# Patient Record
Sex: Male | Born: 1950
Health system: Southern US, Community
[De-identification: ages and names within clinical notes are randomized; demographics above are authoritative.]

## PROBLEM LIST (undated history)

## (undated) DIAGNOSIS — M660 Rupture of popliteal cyst: Secondary | ICD-10-CM

## (undated) DIAGNOSIS — E119 Type 2 diabetes mellitus without complications: Secondary | ICD-10-CM

## (undated) DIAGNOSIS — D1730 Benign lipomatous neoplasm of skin and subcutaneous tissue of unspecified sites: Secondary | ICD-10-CM

## (undated) DIAGNOSIS — J449 Chronic obstructive pulmonary disease, unspecified: Secondary | ICD-10-CM

## (undated) DIAGNOSIS — M1710 Unilateral primary osteoarthritis, unspecified knee: Secondary | ICD-10-CM

## (undated) DIAGNOSIS — M179 Osteoarthritis of knee, unspecified: Secondary | ICD-10-CM

## (undated) DIAGNOSIS — M1990 Unspecified osteoarthritis, unspecified site: Secondary | ICD-10-CM

## (undated) DIAGNOSIS — N529 Male erectile dysfunction, unspecified: Secondary | ICD-10-CM

## (undated) DIAGNOSIS — Z87891 Personal history of nicotine dependence: Secondary | ICD-10-CM

## (undated) DIAGNOSIS — I471 Supraventricular tachycardia, unspecified: Secondary | ICD-10-CM

## (undated) DIAGNOSIS — E785 Hyperlipidemia, unspecified: Secondary | ICD-10-CM

## (undated) DIAGNOSIS — I10 Essential (primary) hypertension: Secondary | ICD-10-CM

## (undated) DIAGNOSIS — M7062 Trochanteric bursitis, left hip: Secondary | ICD-10-CM

## (undated) HISTORY — DX: Chronic obstructive pulmonary disease, unspecified: J44.9

## (undated) HISTORY — DX: Supraventricular tachycardia, unspecified: I47.10

## (undated) HISTORY — DX: Male erectile dysfunction, unspecified: N52.9

## (undated) HISTORY — DX: Unspecified osteoarthritis, unspecified site: M19.90

## (undated) HISTORY — DX: Benign lipomatous neoplasm of skin and subcutaneous tissue of unspecified sites: D17.30

## (undated) HISTORY — DX: Unilateral primary osteoarthritis, unspecified knee: M17.10

## (undated) HISTORY — DX: Hyperlipidemia, unspecified: E78.5

## (undated) HISTORY — DX: Type 2 diabetes mellitus without complications: E11.9

## (undated) HISTORY — DX: Osteoarthritis of knee, unspecified: M17.9

## (undated) HISTORY — DX: Trochanteric bursitis, left hip: M70.62

## (undated) HISTORY — DX: Supraventricular tachycardia: I47.1

## (undated) HISTORY — DX: Rupture of popliteal cyst: M66.0

## (undated) HISTORY — DX: Essential (primary) hypertension: I10

## (undated) HISTORY — DX: Personal history of nicotine dependence: Z87.891

---

## 1999-03-10 ENCOUNTER — Emergency Department (HOSPITAL_COMMUNITY): Admission: EM | Admit: 1999-03-10 | Discharge: 1999-03-10 | Payer: Self-pay | Admitting: Emergency Medicine

## 1999-08-17 ENCOUNTER — Encounter: Payer: Self-pay | Admitting: Emergency Medicine

## 1999-08-17 ENCOUNTER — Inpatient Hospital Stay (HOSPITAL_COMMUNITY): Admission: EM | Admit: 1999-08-17 | Discharge: 1999-08-21 | Payer: Self-pay

## 1999-08-17 ENCOUNTER — Encounter: Payer: Self-pay | Admitting: Internal Medicine

## 1999-08-18 ENCOUNTER — Encounter: Payer: Self-pay | Admitting: Internal Medicine

## 1999-08-19 ENCOUNTER — Encounter: Payer: Self-pay | Admitting: Internal Medicine

## 1999-08-21 ENCOUNTER — Encounter: Payer: Self-pay | Admitting: Internal Medicine

## 1999-08-30 ENCOUNTER — Encounter: Admission: RE | Admit: 1999-08-30 | Discharge: 1999-08-30 | Payer: Self-pay | Admitting: Internal Medicine

## 1999-08-30 ENCOUNTER — Encounter: Payer: Self-pay | Admitting: Internal Medicine

## 2000-07-22 ENCOUNTER — Emergency Department (HOSPITAL_COMMUNITY): Admission: EM | Admit: 2000-07-22 | Discharge: 2000-07-22 | Payer: Self-pay | Admitting: *Deleted

## 2001-04-14 ENCOUNTER — Emergency Department (HOSPITAL_COMMUNITY): Admission: EM | Admit: 2001-04-14 | Discharge: 2001-04-15 | Payer: Self-pay | Admitting: Emergency Medicine

## 2002-05-02 ENCOUNTER — Emergency Department (HOSPITAL_COMMUNITY): Admission: EM | Admit: 2002-05-02 | Discharge: 2002-05-02 | Payer: Self-pay | Admitting: Emergency Medicine

## 2002-05-02 ENCOUNTER — Encounter: Payer: Self-pay | Admitting: Emergency Medicine

## 2002-10-13 ENCOUNTER — Emergency Department (HOSPITAL_COMMUNITY): Admission: EM | Admit: 2002-10-13 | Discharge: 2002-10-13 | Payer: Self-pay | Admitting: *Deleted

## 2002-12-21 ENCOUNTER — Emergency Department (HOSPITAL_COMMUNITY): Admission: EM | Admit: 2002-12-21 | Discharge: 2002-12-21 | Payer: Self-pay | Admitting: Emergency Medicine

## 2003-03-09 ENCOUNTER — Emergency Department (HOSPITAL_COMMUNITY): Admission: EM | Admit: 2003-03-09 | Discharge: 2003-03-09 | Payer: Self-pay | Admitting: Emergency Medicine

## 2003-09-12 ENCOUNTER — Encounter: Admission: RE | Admit: 2003-09-12 | Discharge: 2003-09-12 | Payer: Self-pay | Admitting: Internal Medicine

## 2003-10-31 ENCOUNTER — Encounter: Admission: RE | Admit: 2003-10-31 | Discharge: 2003-10-31 | Payer: Self-pay | Admitting: Internal Medicine

## 2003-11-19 ENCOUNTER — Ambulatory Visit (HOSPITAL_COMMUNITY): Admission: RE | Admit: 2003-11-19 | Discharge: 2003-11-19 | Payer: Self-pay | Admitting: Internal Medicine

## 2004-05-15 ENCOUNTER — Ambulatory Visit (HOSPITAL_COMMUNITY): Admission: RE | Admit: 2004-05-15 | Discharge: 2004-05-15 | Payer: Self-pay | Admitting: Internal Medicine

## 2004-05-15 ENCOUNTER — Ambulatory Visit: Payer: Self-pay | Admitting: Internal Medicine

## 2004-05-24 ENCOUNTER — Ambulatory Visit: Payer: Self-pay | Admitting: Internal Medicine

## 2005-03-03 ENCOUNTER — Emergency Department (HOSPITAL_COMMUNITY): Admission: EM | Admit: 2005-03-03 | Discharge: 2005-03-03 | Payer: Self-pay | Admitting: Emergency Medicine

## 2005-03-13 ENCOUNTER — Ambulatory Visit: Payer: Self-pay | Admitting: Internal Medicine

## 2005-03-17 ENCOUNTER — Ambulatory Visit (HOSPITAL_COMMUNITY): Admission: RE | Admit: 2005-03-17 | Discharge: 2005-03-17 | Payer: Self-pay | Admitting: Internal Medicine

## 2005-12-16 ENCOUNTER — Emergency Department (HOSPITAL_COMMUNITY): Admission: EM | Admit: 2005-12-16 | Discharge: 2005-12-16 | Payer: Self-pay | Admitting: Emergency Medicine

## 2006-01-08 ENCOUNTER — Ambulatory Visit: Payer: Self-pay | Admitting: Internal Medicine

## 2006-04-02 ENCOUNTER — Emergency Department (HOSPITAL_COMMUNITY): Admission: EM | Admit: 2006-04-02 | Discharge: 2006-04-02 | Payer: Self-pay | Admitting: Emergency Medicine

## 2006-06-18 DIAGNOSIS — J438 Other emphysema: Secondary | ICD-10-CM | POA: Insufficient documentation

## 2006-06-18 DIAGNOSIS — J449 Chronic obstructive pulmonary disease, unspecified: Secondary | ICD-10-CM

## 2006-06-18 DIAGNOSIS — F528 Other sexual dysfunction not due to a substance or known physiological condition: Secondary | ICD-10-CM | POA: Insufficient documentation

## 2006-06-18 DIAGNOSIS — M1990 Unspecified osteoarthritis, unspecified site: Secondary | ICD-10-CM | POA: Insufficient documentation

## 2006-06-18 DIAGNOSIS — M7120 Synovial cyst of popliteal space [Baker], unspecified knee: Secondary | ICD-10-CM | POA: Insufficient documentation

## 2006-07-01 ENCOUNTER — Ambulatory Visit: Payer: Self-pay | Admitting: Internal Medicine

## 2006-07-23 ENCOUNTER — Emergency Department (HOSPITAL_COMMUNITY): Admission: EM | Admit: 2006-07-23 | Discharge: 2006-07-23 | Payer: Self-pay | Admitting: Emergency Medicine

## 2006-07-28 ENCOUNTER — Encounter: Payer: Self-pay | Admitting: Licensed Clinical Social Worker

## 2006-07-28 ENCOUNTER — Encounter (INDEPENDENT_AMBULATORY_CARE_PROVIDER_SITE_OTHER): Payer: Self-pay | Admitting: Internal Medicine

## 2006-07-28 ENCOUNTER — Ambulatory Visit: Payer: Self-pay | Admitting: Internal Medicine

## 2006-07-29 ENCOUNTER — Telehealth (INDEPENDENT_AMBULATORY_CARE_PROVIDER_SITE_OTHER): Payer: Self-pay | Admitting: *Deleted

## 2006-07-29 LAB — CONVERTED CEMR LAB
Amphetamine Screen, Ur: NEGATIVE
BUN: 10 mg/dL (ref 6–23)
Barbiturate Quant, Ur: NEGATIVE
Benzodiazepines.: NEGATIVE
CO2: 27 meq/L (ref 19–32)
Calcium: 9.2 mg/dL (ref 8.4–10.5)
Chloride: 107 meq/L (ref 96–112)
Cocaine Metabolites: NEGATIVE
Creatinine, Ser: 0.92 mg/dL (ref 0.40–1.50)
Creatinine,U: 409.8 mg/dL
Glucose, Bld: 97 mg/dL (ref 70–99)
HCT: 44 % (ref 39.0–52.0)
Hemoglobin: 14 g/dL (ref 13.0–17.0)
MCHC: 31.8 g/dL (ref 30.0–36.0)
MCV: 100 fL (ref 78.0–100.0)
Marijuana Metabolite: NEGATIVE
Methadone: NEGATIVE
Opiates: NEGATIVE
PSA: 0.89 ng/mL (ref 0.10–4.00)
Phencyclidine (PCP): NEGATIVE
Platelets: 266 10*3/uL (ref 150–400)
Potassium: 4.1 meq/L (ref 3.5–5.3)
Propoxyphene: NEGATIVE
RBC: 4.4 M/uL (ref 4.22–5.81)
RDW: 14.1 % — ABNORMAL HIGH (ref 11.5–14.0)
Sodium: 147 meq/L — ABNORMAL HIGH (ref 135–145)
WBC: 6.7 10*3/uL (ref 4.0–10.5)

## 2006-07-31 ENCOUNTER — Telehealth (INDEPENDENT_AMBULATORY_CARE_PROVIDER_SITE_OTHER): Payer: Self-pay | Admitting: *Deleted

## 2006-07-31 ENCOUNTER — Encounter: Payer: Self-pay | Admitting: Licensed Clinical Social Worker

## 2006-08-07 ENCOUNTER — Encounter (INDEPENDENT_AMBULATORY_CARE_PROVIDER_SITE_OTHER): Payer: Self-pay | Admitting: Internal Medicine

## 2006-08-07 ENCOUNTER — Encounter: Admission: RE | Admit: 2006-08-07 | Discharge: 2006-09-10 | Payer: Self-pay | Admitting: Internal Medicine

## 2006-08-07 ENCOUNTER — Ambulatory Visit: Payer: Self-pay | Admitting: Internal Medicine

## 2006-08-07 LAB — CONVERTED CEMR LAB
BUN: 7 mg/dL (ref 6–23)
CO2: 30 meq/L (ref 19–32)
Calcium: 9.4 mg/dL (ref 8.4–10.5)
Chloride: 105 meq/L (ref 96–112)
Creatinine, Ser: 0.89 mg/dL (ref 0.40–1.50)
Glucose, Bld: 101 mg/dL — ABNORMAL HIGH (ref 70–99)
Potassium: 4.9 meq/L (ref 3.5–5.3)
Sodium: 141 meq/L (ref 135–145)

## 2006-09-09 ENCOUNTER — Telehealth: Payer: Self-pay | Admitting: *Deleted

## 2006-09-10 ENCOUNTER — Encounter (INDEPENDENT_AMBULATORY_CARE_PROVIDER_SITE_OTHER): Payer: Self-pay | Admitting: Internal Medicine

## 2006-09-22 ENCOUNTER — Telehealth (INDEPENDENT_AMBULATORY_CARE_PROVIDER_SITE_OTHER): Payer: Self-pay | Admitting: *Deleted

## 2006-09-24 ENCOUNTER — Ambulatory Visit: Payer: Self-pay | Admitting: *Deleted

## 2006-09-24 DIAGNOSIS — Z87891 Personal history of nicotine dependence: Secondary | ICD-10-CM

## 2006-10-15 ENCOUNTER — Emergency Department (HOSPITAL_COMMUNITY): Admission: EM | Admit: 2006-10-15 | Discharge: 2006-10-15 | Payer: Self-pay | Admitting: Emergency Medicine

## 2006-11-11 ENCOUNTER — Ambulatory Visit: Payer: Self-pay | Admitting: Hospitalist

## 2006-12-16 ENCOUNTER — Encounter (INDEPENDENT_AMBULATORY_CARE_PROVIDER_SITE_OTHER): Payer: Self-pay | Admitting: Internal Medicine

## 2006-12-28 LAB — HM COLONOSCOPY

## 2007-01-08 ENCOUNTER — Ambulatory Visit: Payer: Self-pay | Admitting: Hospitalist

## 2007-01-20 ENCOUNTER — Encounter (INDEPENDENT_AMBULATORY_CARE_PROVIDER_SITE_OTHER): Payer: Self-pay | Admitting: Internal Medicine

## 2007-01-28 ENCOUNTER — Encounter (INDEPENDENT_AMBULATORY_CARE_PROVIDER_SITE_OTHER): Payer: Self-pay | Admitting: Internal Medicine

## 2007-03-01 ENCOUNTER — Telehealth: Payer: Self-pay | Admitting: *Deleted

## 2007-03-04 ENCOUNTER — Ambulatory Visit: Payer: Self-pay | Admitting: Internal Medicine

## 2007-03-04 ENCOUNTER — Encounter (INDEPENDENT_AMBULATORY_CARE_PROVIDER_SITE_OTHER): Payer: Self-pay | Admitting: Internal Medicine

## 2007-03-04 LAB — CONVERTED CEMR LAB
BUN: 17 mg/dL (ref 6–23)
CO2: 26 meq/L (ref 19–32)
Calcium: 8.9 mg/dL (ref 8.4–10.5)
Chloride: 106 meq/L (ref 96–112)
Creatinine, Ser: 0.92 mg/dL (ref 0.40–1.50)
Glucose, Bld: 124 mg/dL — ABNORMAL HIGH (ref 70–99)
HCT: 42.2 % (ref 39.0–52.0)
Hemoglobin: 14 g/dL (ref 13.0–17.0)
MCHC: 33.2 g/dL (ref 30.0–36.0)
MCV: 97.2 fL (ref 78.0–100.0)
Platelets: 213 10*3/uL (ref 150–400)
Potassium: 4.2 meq/L (ref 3.5–5.3)
RBC: 4.34 M/uL (ref 4.22–5.81)
RDW: 14.5 % — ABNORMAL HIGH (ref 11.5–14.0)
Sodium: 144 meq/L (ref 135–145)
WBC: 8.8 10*3/uL (ref 4.0–10.5)

## 2007-03-11 ENCOUNTER — Telehealth (INDEPENDENT_AMBULATORY_CARE_PROVIDER_SITE_OTHER): Payer: Self-pay | Admitting: Internal Medicine

## 2007-03-12 ENCOUNTER — Ambulatory Visit: Payer: Self-pay | Admitting: Hospitalist

## 2007-03-12 ENCOUNTER — Encounter (INDEPENDENT_AMBULATORY_CARE_PROVIDER_SITE_OTHER): Payer: Self-pay | Admitting: Internal Medicine

## 2007-03-15 LAB — CONVERTED CEMR LAB
Cholesterol: 215 mg/dL — ABNORMAL HIGH (ref 0–200)
HDL: 49 mg/dL (ref >39)
LDL Cholesterol: 143 mg/dL — ABNORMAL HIGH (ref 0–99)
Total CHOL/HDL Ratio: 4.4
Triglycerides: 115 mg/dL (ref <150)
VLDL: 23 mg/dL (ref 0–40)

## 2007-03-19 ENCOUNTER — Telehealth: Payer: Self-pay | Admitting: *Deleted

## 2007-03-25 ENCOUNTER — Telehealth (INDEPENDENT_AMBULATORY_CARE_PROVIDER_SITE_OTHER): Payer: Self-pay | Admitting: *Deleted

## 2007-04-07 ENCOUNTER — Ambulatory Visit: Payer: Self-pay | Admitting: Internal Medicine

## 2007-04-08 ENCOUNTER — Emergency Department (HOSPITAL_COMMUNITY): Admission: EM | Admit: 2007-04-08 | Discharge: 2007-04-08 | Payer: Self-pay | Admitting: Emergency Medicine

## 2007-05-19 ENCOUNTER — Telehealth (INDEPENDENT_AMBULATORY_CARE_PROVIDER_SITE_OTHER): Payer: Self-pay | Admitting: Internal Medicine

## 2007-06-08 ENCOUNTER — Telehealth: Payer: Self-pay | Admitting: *Deleted

## 2007-07-08 ENCOUNTER — Ambulatory Visit: Payer: Self-pay | Admitting: Infectious Disease

## 2007-07-08 DIAGNOSIS — I10 Essential (primary) hypertension: Secondary | ICD-10-CM | POA: Insufficient documentation

## 2007-07-22 ENCOUNTER — Encounter: Admission: RE | Admit: 2007-07-22 | Discharge: 2007-10-19 | Payer: Self-pay | Admitting: Internal Medicine

## 2007-07-28 ENCOUNTER — Encounter (INDEPENDENT_AMBULATORY_CARE_PROVIDER_SITE_OTHER): Payer: Self-pay | Admitting: Internal Medicine

## 2007-09-22 ENCOUNTER — Ambulatory Visit: Payer: Self-pay | Admitting: *Deleted

## 2007-10-14 ENCOUNTER — Ambulatory Visit: Payer: Self-pay | Admitting: Internal Medicine

## 2007-10-14 ENCOUNTER — Encounter (INDEPENDENT_AMBULATORY_CARE_PROVIDER_SITE_OTHER): Payer: Self-pay | Admitting: Internal Medicine

## 2007-10-14 LAB — CONVERTED CEMR LAB
ALT: 12 units/L (ref 0–53)
AST: 17 units/L (ref 0–37)
Albumin: 4.3 g/dL (ref 3.5–5.2)
Alkaline Phosphatase: 81 units/L (ref 39–117)
BUN: 22 mg/dL (ref 6–23)
CO2: 26 meq/L (ref 19–32)
Calcium: 9.2 mg/dL (ref 8.4–10.5)
Chloride: 107 meq/L (ref 96–112)
Cholesterol: 165 mg/dL (ref 0–200)
Creatinine, Ser: 1.07 mg/dL (ref 0.40–1.50)
Glucose, Bld: 112 mg/dL — ABNORMAL HIGH (ref 70–99)
HDL: 50 mg/dL (ref >39)
Hgb A1c MFr Bld: 6.3 %
LDL Cholesterol: 103 mg/dL — ABNORMAL HIGH (ref 0–99)
Potassium: 4.7 meq/L (ref 3.5–5.3)
Sodium: 143 meq/L (ref 135–145)
Total Bilirubin: 0.4 mg/dL (ref 0.3–1.2)
Total CHOL/HDL Ratio: 3.3
Total Protein: 7.9 g/dL (ref 6.0–8.3)
Triglycerides: 58 mg/dL (ref <150)
VLDL: 12 mg/dL (ref 0–40)

## 2007-10-28 ENCOUNTER — Ambulatory Visit: Payer: Self-pay | Admitting: Infectious Disease

## 2007-11-12 ENCOUNTER — Ambulatory Visit: Payer: Self-pay | Admitting: Internal Medicine

## 2007-12-08 ENCOUNTER — Telehealth: Payer: Self-pay | Admitting: *Deleted

## 2008-04-12 ENCOUNTER — Ambulatory Visit: Payer: Self-pay | Admitting: Internal Medicine

## 2008-04-13 ENCOUNTER — Ambulatory Visit (HOSPITAL_COMMUNITY): Admission: RE | Admit: 2008-04-13 | Discharge: 2008-04-13 | Payer: Self-pay | Admitting: Internal Medicine

## 2008-04-13 ENCOUNTER — Ambulatory Visit: Payer: Self-pay | Admitting: Internal Medicine

## 2008-04-13 ENCOUNTER — Encounter: Payer: Self-pay | Admitting: Internal Medicine

## 2008-04-18 ENCOUNTER — Telehealth (INDEPENDENT_AMBULATORY_CARE_PROVIDER_SITE_OTHER): Payer: Self-pay | Admitting: Internal Medicine

## 2008-04-18 ENCOUNTER — Ambulatory Visit: Payer: Self-pay | Admitting: Infectious Disease

## 2008-04-18 ENCOUNTER — Inpatient Hospital Stay (HOSPITAL_COMMUNITY): Admission: EM | Admit: 2008-04-18 | Discharge: 2008-04-19 | Payer: Self-pay | Admitting: Emergency Medicine

## 2008-04-18 ENCOUNTER — Encounter: Payer: Self-pay | Admitting: Infectious Disease

## 2008-04-19 ENCOUNTER — Encounter (INDEPENDENT_AMBULATORY_CARE_PROVIDER_SITE_OTHER): Payer: Self-pay | Admitting: Internal Medicine

## 2008-04-19 DIAGNOSIS — I471 Supraventricular tachycardia: Secondary | ICD-10-CM

## 2008-04-20 ENCOUNTER — Telehealth (INDEPENDENT_AMBULATORY_CARE_PROVIDER_SITE_OTHER): Payer: Self-pay | Admitting: Internal Medicine

## 2008-04-24 ENCOUNTER — Ambulatory Visit: Payer: Self-pay | Admitting: Infectious Diseases

## 2008-05-17 ENCOUNTER — Ambulatory Visit: Payer: Self-pay | Admitting: *Deleted

## 2008-05-23 ENCOUNTER — Telehealth (INDEPENDENT_AMBULATORY_CARE_PROVIDER_SITE_OTHER): Payer: Self-pay | Admitting: Internal Medicine

## 2008-05-29 ENCOUNTER — Ambulatory Visit: Payer: Self-pay | Admitting: Internal Medicine

## 2008-06-05 ENCOUNTER — Emergency Department (HOSPITAL_COMMUNITY): Admission: EM | Admit: 2008-06-05 | Discharge: 2008-06-05 | Payer: Self-pay | Admitting: Emergency Medicine

## 2008-06-20 ENCOUNTER — Ambulatory Visit: Payer: Self-pay | Admitting: Infectious Diseases

## 2008-06-20 ENCOUNTER — Encounter (INDEPENDENT_AMBULATORY_CARE_PROVIDER_SITE_OTHER): Payer: Self-pay | Admitting: Internal Medicine

## 2008-06-21 ENCOUNTER — Telehealth: Payer: Self-pay | Admitting: *Deleted

## 2008-06-21 ENCOUNTER — Encounter (INDEPENDENT_AMBULATORY_CARE_PROVIDER_SITE_OTHER): Payer: Self-pay | Admitting: Internal Medicine

## 2008-06-21 DIAGNOSIS — E1165 Type 2 diabetes mellitus with hyperglycemia: Secondary | ICD-10-CM

## 2008-06-21 LAB — CONVERTED CEMR LAB
BUN: 26 mg/dL — ABNORMAL HIGH (ref 6–23)
CO2: 21 meq/L (ref 19–32)
Calcium: 8.9 mg/dL (ref 8.4–10.5)
Chloride: 90 meq/L — ABNORMAL LOW (ref 96–112)
Creatinine, Ser: 1.75 mg/dL — ABNORMAL HIGH (ref 0.40–1.50)
Glucose, Bld: 798 mg/dL (ref 70–99)
Potassium: 4.8 meq/L (ref 3.5–5.3)
Sodium: 124 meq/L — ABNORMAL LOW (ref 135–145)

## 2008-06-28 ENCOUNTER — Ambulatory Visit: Payer: Self-pay | Admitting: Internal Medicine

## 2008-06-28 ENCOUNTER — Inpatient Hospital Stay (HOSPITAL_COMMUNITY): Admission: AD | Admit: 2008-06-28 | Discharge: 2008-07-01 | Payer: Self-pay | Admitting: Infectious Diseases

## 2008-06-28 ENCOUNTER — Encounter (INDEPENDENT_AMBULATORY_CARE_PROVIDER_SITE_OTHER): Payer: Self-pay | Admitting: Internal Medicine

## 2008-06-28 LAB — CONVERTED CEMR LAB
ALT: 23 units/L (ref 0–53)
AST: 25 units/L (ref 0–37)
Albumin: 4.1 g/dL (ref 3.5–5.2)
Alkaline Phosphatase: 94 units/L (ref 39–117)
BUN: 39 mg/dL — ABNORMAL HIGH (ref 6–23)
Blood Glucose, Home Monitor: 2 mg/dL
CO2: 24 meq/L (ref 19–32)
Calcium: 9.1 mg/dL (ref 8.4–10.5)
Chloride: 85 meq/L — ABNORMAL LOW (ref 96–112)
Creatinine, Ser: 2.29 mg/dL — ABNORMAL HIGH (ref 0.40–1.50)
Creatinine, Urine: 55.3 mg/dL
Glucose, Bld: 900 mg/dL (ref 70–99)
HCT: 39.3 % (ref 39.0–52.0)
Hemoglobin: 13.1 g/dL (ref 13.0–17.0)
Hgb A1c MFr Bld: 12.9 %
MCHC: 33.3 g/dL (ref 30.0–36.0)
MCV: 95.6 fL (ref 78.0–100.0)
Microalb Creat Ratio: 38.5 mg/g — ABNORMAL HIGH (ref 0.0–30.0)
Microalb, Ur: 2.13 mg/dL — ABNORMAL HIGH (ref 0.00–1.89)
Platelets: 210 10*3/uL (ref 150–400)
Potassium: 4.4 meq/L (ref 3.5–5.3)
RBC: 4.11 M/uL — ABNORMAL LOW (ref 4.22–5.81)
RDW: 13.5 % (ref 11.5–15.5)
Sodium: 123 meq/L — ABNORMAL LOW (ref 135–145)
Total Bilirubin: 0.9 mg/dL (ref 0.3–1.2)
Total Protein: 8.5 g/dL — ABNORMAL HIGH (ref 6.0–8.3)
WBC: 8 10*3/uL (ref 4.0–10.5)

## 2008-07-05 ENCOUNTER — Encounter (INDEPENDENT_AMBULATORY_CARE_PROVIDER_SITE_OTHER): Payer: Self-pay | Admitting: Internal Medicine

## 2008-07-05 ENCOUNTER — Ambulatory Visit: Payer: Self-pay | Admitting: Internal Medicine

## 2008-07-05 DIAGNOSIS — E785 Hyperlipidemia, unspecified: Secondary | ICD-10-CM | POA: Insufficient documentation

## 2008-07-05 LAB — CONVERTED CEMR LAB
BUN: 11 mg/dL (ref 6–23)
CO2: 26 meq/L (ref 19–32)
Chloride: 109 meq/L (ref 96–112)
Creatinine, Ser: 1.06 mg/dL (ref 0.40–1.50)
Glucose, Bld: 73 mg/dL (ref 70–99)
Potassium: 3.7 meq/L (ref 3.5–5.3)

## 2008-07-31 ENCOUNTER — Telehealth (INDEPENDENT_AMBULATORY_CARE_PROVIDER_SITE_OTHER): Payer: Self-pay | Admitting: Internal Medicine

## 2008-08-03 ENCOUNTER — Telehealth (INDEPENDENT_AMBULATORY_CARE_PROVIDER_SITE_OTHER): Payer: Self-pay | Admitting: Internal Medicine

## 2008-09-07 ENCOUNTER — Ambulatory Visit: Payer: Self-pay | Admitting: *Deleted

## 2008-09-15 ENCOUNTER — Encounter (INDEPENDENT_AMBULATORY_CARE_PROVIDER_SITE_OTHER): Payer: Self-pay | Admitting: Internal Medicine

## 2008-09-27 LAB — HM DIABETES EYE EXAM

## 2008-10-17 ENCOUNTER — Encounter (INDEPENDENT_AMBULATORY_CARE_PROVIDER_SITE_OTHER): Payer: Self-pay | Admitting: Internal Medicine

## 2008-10-23 ENCOUNTER — Telehealth (INDEPENDENT_AMBULATORY_CARE_PROVIDER_SITE_OTHER): Payer: Self-pay | Admitting: *Deleted

## 2008-10-27 ENCOUNTER — Telehealth (INDEPENDENT_AMBULATORY_CARE_PROVIDER_SITE_OTHER): Payer: Self-pay | Admitting: Internal Medicine

## 2008-11-09 ENCOUNTER — Ambulatory Visit: Payer: Self-pay | Admitting: *Deleted

## 2008-11-09 ENCOUNTER — Emergency Department (HOSPITAL_COMMUNITY): Admission: EM | Admit: 2008-11-09 | Discharge: 2008-11-09 | Payer: Self-pay | Admitting: Emergency Medicine

## 2008-11-09 ENCOUNTER — Encounter (INDEPENDENT_AMBULATORY_CARE_PROVIDER_SITE_OTHER): Payer: Self-pay | Admitting: Internal Medicine

## 2008-11-09 LAB — CONVERTED CEMR LAB
ALT: 8 units/L (ref 0–53)
AST: 16 units/L (ref 0–37)
Albumin: 4.5 g/dL (ref 3.5–5.2)
Calcium: 9.8 mg/dL (ref 8.4–10.5)
Chloride: 107 meq/L (ref 96–112)
Creatinine, Ser: 0.9 mg/dL (ref 0.40–1.50)
LDL Cholesterol: 30 mg/dL (ref 0–99)
Potassium: 4.1 meq/L (ref 3.5–5.3)
Sodium: 142 meq/L (ref 135–145)
Total Protein: 7.9 g/dL (ref 6.0–8.3)
Triglycerides: 135 mg/dL (ref <150)
VLDL: 27 mg/dL (ref 0–40)

## 2008-11-22 ENCOUNTER — Telehealth (INDEPENDENT_AMBULATORY_CARE_PROVIDER_SITE_OTHER): Payer: Self-pay | Admitting: Internal Medicine

## 2008-12-28 ENCOUNTER — Telehealth: Payer: Self-pay | Admitting: *Deleted

## 2009-01-26 ENCOUNTER — Telehealth: Payer: Self-pay | Admitting: Internal Medicine

## 2009-02-27 ENCOUNTER — Telehealth: Payer: Self-pay | Admitting: *Deleted

## 2009-03-14 ENCOUNTER — Ambulatory Visit: Payer: Self-pay | Admitting: Internal Medicine

## 2009-03-14 ENCOUNTER — Encounter: Payer: Self-pay | Admitting: Internal Medicine

## 2009-03-14 LAB — CONVERTED CEMR LAB: Hgb A1c MFr Bld: 5.9 %

## 2009-03-20 ENCOUNTER — Telehealth: Payer: Self-pay | Admitting: Internal Medicine

## 2009-03-28 ENCOUNTER — Telehealth: Payer: Self-pay | Admitting: *Deleted

## 2009-04-25 ENCOUNTER — Telehealth: Payer: Self-pay | Admitting: Internal Medicine

## 2009-04-30 ENCOUNTER — Telehealth: Payer: Self-pay | Admitting: Internal Medicine

## 2009-05-28 ENCOUNTER — Telehealth: Payer: Self-pay | Admitting: Internal Medicine

## 2009-05-30 ENCOUNTER — Encounter: Payer: Self-pay | Admitting: Internal Medicine

## 2009-05-30 ENCOUNTER — Telehealth (INDEPENDENT_AMBULATORY_CARE_PROVIDER_SITE_OTHER): Payer: Self-pay | Admitting: *Deleted

## 2009-06-06 ENCOUNTER — Encounter: Payer: Self-pay | Admitting: Cardiology

## 2009-06-28 ENCOUNTER — Telehealth: Payer: Self-pay | Admitting: Internal Medicine

## 2009-07-30 ENCOUNTER — Telehealth: Payer: Self-pay | Admitting: Internal Medicine

## 2009-08-15 ENCOUNTER — Encounter: Payer: Self-pay | Admitting: Internal Medicine

## 2009-08-15 ENCOUNTER — Ambulatory Visit: Payer: Self-pay | Admitting: Internal Medicine

## 2009-08-15 DIAGNOSIS — D1739 Benign lipomatous neoplasm of skin and subcutaneous tissue of other sites: Secondary | ICD-10-CM

## 2009-08-15 LAB — CONVERTED CEMR LAB: Hgb A1c MFr Bld: 6.1 %

## 2009-08-27 ENCOUNTER — Telehealth: Payer: Self-pay | Admitting: Internal Medicine

## 2009-09-27 ENCOUNTER — Ambulatory Visit: Payer: Self-pay | Admitting: Internal Medicine

## 2009-09-28 LAB — CONVERTED CEMR LAB
Albumin: 4.6 g/dL (ref 3.5–5.2)
Alkaline Phosphatase: 79 units/L (ref 39–117)
BUN: 14 mg/dL (ref 6–23)
Calcium: 8.9 mg/dL (ref 8.4–10.5)
Chloride: 105 meq/L (ref 96–112)
Creatinine, Urine: 226.6 mg/dL
Glucose, Bld: 97 mg/dL (ref 70–99)
Hemoglobin: 12.4 g/dL — ABNORMAL LOW (ref 13.0–17.0)
MCHC: 32.5 g/dL (ref 30.0–36.0)
Potassium: 4.4 meq/L (ref 3.5–5.3)
RBC: 4.04 M/uL — ABNORMAL LOW (ref 4.22–5.81)

## 2009-10-02 ENCOUNTER — Ambulatory Visit: Payer: Self-pay | Admitting: Internal Medicine

## 2009-10-02 LAB — CONVERTED CEMR LAB
OCCULT 1: NEGATIVE
OCCULT 2: NEGATIVE

## 2009-10-08 ENCOUNTER — Telehealth: Payer: Self-pay | Admitting: *Deleted

## 2009-10-15 ENCOUNTER — Encounter: Payer: Self-pay | Admitting: Internal Medicine

## 2009-10-25 ENCOUNTER — Telehealth: Payer: Self-pay | Admitting: Internal Medicine

## 2009-11-26 ENCOUNTER — Telehealth: Payer: Self-pay | Admitting: Internal Medicine

## 2009-12-26 ENCOUNTER — Telehealth: Payer: Self-pay | Admitting: Internal Medicine

## 2010-01-02 ENCOUNTER — Telehealth: Payer: Self-pay | Admitting: Internal Medicine

## 2010-01-25 ENCOUNTER — Telehealth (INDEPENDENT_AMBULATORY_CARE_PROVIDER_SITE_OTHER): Payer: Self-pay | Admitting: *Deleted

## 2010-02-26 ENCOUNTER — Telehealth: Payer: Self-pay | Admitting: Internal Medicine

## 2010-02-26 ENCOUNTER — Encounter: Payer: Self-pay | Admitting: Internal Medicine

## 2010-03-27 ENCOUNTER — Telehealth: Payer: Self-pay | Admitting: Internal Medicine

## 2010-04-29 ENCOUNTER — Telehealth: Payer: Self-pay | Admitting: Internal Medicine

## 2010-05-09 ENCOUNTER — Ambulatory Visit: Payer: Self-pay | Admitting: Internal Medicine

## 2010-05-15 ENCOUNTER — Ambulatory Visit: Payer: Self-pay | Admitting: Internal Medicine

## 2010-05-15 LAB — CONVERTED CEMR LAB
AST: 12 units/L (ref 0–37)
Alkaline Phosphatase: 85 units/L (ref 39–117)
Glucose, Bld: 129 mg/dL — ABNORMAL HIGH (ref 70–99)
Potassium: 3.9 meq/L (ref 3.5–5.3)
Sodium: 142 meq/L (ref 135–145)
Total Bilirubin: 0.5 mg/dL (ref 0.3–1.2)
Total Protein: 7.5 g/dL (ref 6.0–8.3)

## 2010-05-30 ENCOUNTER — Ambulatory Visit: Payer: Self-pay | Admitting: Internal Medicine

## 2010-05-30 LAB — CONVERTED CEMR LAB
OCCULT 2: NEGATIVE
OCCULT 3: NEGATIVE

## 2010-06-10 ENCOUNTER — Telehealth: Payer: Self-pay | Admitting: Internal Medicine

## 2010-06-11 ENCOUNTER — Telehealth: Payer: Self-pay | Admitting: Internal Medicine

## 2010-06-12 ENCOUNTER — Ambulatory Visit: Payer: Self-pay | Admitting: Internal Medicine

## 2010-06-24 ENCOUNTER — Telehealth: Payer: Self-pay | Admitting: Internal Medicine

## 2010-07-09 ENCOUNTER — Ambulatory Visit
Admission: RE | Admit: 2010-07-09 | Discharge: 2010-07-09 | Payer: Self-pay | Source: Home / Self Care | Attending: Internal Medicine | Admitting: Internal Medicine

## 2010-07-09 ENCOUNTER — Telehealth: Payer: Self-pay | Admitting: Internal Medicine

## 2010-07-09 DIAGNOSIS — M25559 Pain in unspecified hip: Secondary | ICD-10-CM | POA: Insufficient documentation

## 2010-07-09 LAB — CONVERTED CEMR LAB
Blood Glucose, Fingerstick: 153
Hgb A1c MFr Bld: 6.3 %

## 2010-07-10 LAB — GLUCOSE, CAPILLARY: Glucose-Capillary: 153 mg/dL — ABNORMAL HIGH (ref 70–99)

## 2010-07-14 ENCOUNTER — Encounter: Payer: Self-pay | Admitting: Internal Medicine

## 2010-07-15 ENCOUNTER — Ambulatory Visit (HOSPITAL_COMMUNITY)
Admission: RE | Admit: 2010-07-15 | Discharge: 2010-07-15 | Payer: Self-pay | Source: Home / Self Care | Attending: Internal Medicine | Admitting: Internal Medicine

## 2010-07-15 ENCOUNTER — Encounter: Payer: Self-pay | Admitting: Internal Medicine

## 2010-07-15 ENCOUNTER — Telehealth: Payer: Self-pay | Admitting: Internal Medicine

## 2010-07-19 ENCOUNTER — Ambulatory Visit
Admission: RE | Admit: 2010-07-19 | Discharge: 2010-07-19 | Payer: Self-pay | Source: Home / Self Care | Attending: Family Medicine | Admitting: Family Medicine

## 2010-07-19 ENCOUNTER — Encounter: Payer: Self-pay | Admitting: Family Medicine

## 2010-07-19 DIAGNOSIS — M79609 Pain in unspecified limb: Secondary | ICD-10-CM | POA: Insufficient documentation

## 2010-07-21 LAB — CONVERTED CEMR LAB
BUN: 15 mg/dL (ref 6–23)
CO2: 24 meq/L (ref 19–32)
Calcium: 9.1 mg/dL (ref 8.4–10.5)
Chloride: 105 meq/L (ref 96–112)
Creatinine, Ser: 1.03 mg/dL (ref 0.40–1.50)
Glucose, Bld: 148 mg/dL — ABNORMAL HIGH (ref 70–99)
Magnesium: 1.9 mg/dL (ref 1.5–2.5)
Potassium: 4 meq/L (ref 3.5–5.3)
Sodium: 140 meq/L (ref 135–145)
TSH: 0.765 microintl units/mL (ref 0.350–4.50)

## 2010-07-23 ENCOUNTER — Telehealth: Payer: Self-pay | Admitting: Cardiology

## 2010-07-23 NOTE — Progress Notes (Signed)
Summary: med refill/gp  Phone Note Refill Request Message from:  Fax from Pharmacy on January 02, 2010 10:51 AM  Refills Requested: Medication #1:  SPIRIVA HANDIHALER 18 MCG CAPS Inhale the contents of 1 capsule once daily   Dosage confirmed as above?Dosage Confirmed Last appt. April 7 w/labs.   Method Requested: Electronic Initial call taken by: Chinita Pester RN,  January 02, 2010 10:52 AM  Follow-up for Phone Call        Rx completed in Dr. Tiajuana Amass Follow-up by: Jackson Latino MD,  January 02, 2010 5:04 PM    Prescriptions: SPIRIVA HANDIHALER 18 MCG CAPS (TIOTROPIUM BROMIDE MONOHYDRATE) Inhale the contents of 1 capsule once daily  #30 Capsule x 11   Entered and Authorized by:   Jackson Latino MD   Signed by:   Jackson Latino MD on 01/02/2010   Method used:   [facility]        [facility] [phone][facility] (retail)       [address]       [place], [place]  [address]       Ph: [phone] or [phone]       Fax: [phone]   RxID:   [id number]

## 2010-07-23 NOTE — Progress Notes (Signed)
Summary: refill/gg  Phone Note Refill Request  on July 30, 2009 10:49 AM  Refills Requested: Medication #1:  VICODIN ES 7.5-750 MG TABS Take 1 tablet by mouth four times a day as needed for pain   Last Refilled: 06/28/2009  Method Requested: Telephone to Pharmacy Initial call taken by: Merrie Roof RN,  July 30, 2009 10:49 AM  Follow-up for Phone Call        Rx called to pharmacy Follow-up by: Jackson Latino MD,  July 30, 2009 12:40 PM    Prescriptions: VICODIN ES 7.5-750 MG TABS (HYDROCODONE-ACETAMINOPHEN) Take 1 tablet by mouth four times a day as needed for pain  #120 x 0   Entered and Authorized by:   Jackson Latino MD   Signed by:   Jackson Latino MD on 07/30/2009   Method used:   Telephoned to ...       [facility] [place] [phone][facility] (retail)       [phone]       [place], [place]  [address]       Ph: [phone] or [phone]       Fax: [phone]   RxID:   [id number]

## 2010-07-23 NOTE — Miscellaneous (Signed)
Summary: [facility]: Diabetes Testing Supplies  [facility]: Diabetes Testing Supplies   Imported By: Florinda Marker 08/17/2009 16:58:00  _____________________________________________________________________  External Attachment:    Type:   Image     Comment:   External Document

## 2010-07-23 NOTE — Progress Notes (Signed)
Summary: refill/gg  Phone Note Refill Request  on Oct 25, 2009 3:31 PM  Refills Requested: Medication #1:  VICODIN ES 7.5-750 MG TABS Take 1 tablet by mouth four times a day as needed for pain   Dosage confirmed as above?Dosage Confirmed   Last Refilled: 08/27/2009  Method Requested: Telephone to Pharmacy Initial call taken by: Merrie Roof RN,  Oct 25, 2009 3:31 PM    Prescriptions: VICODIN ES 7.5-750 MG TABS (HYDROCODONE-ACETAMINOPHEN) Take 1 tablet by mouth four times a day as needed for pain  #120 x 0   Entered and Authorized by:   Jackson Latino MD   Signed by:   Jackson Latino MD on 10/25/2009   Method used:   Telephoned to ...       [facility] [place] [phone]* (retail)       [phone] [address] [place]  [address]       Ph: [phone] or [phone]       Fax: [phone]   RxID:   [id number]   Appended Document: refill/gg Prescription for Vicodin 7.5/750mg  #120  with [facility] [address] per order of Dr. Threasa Beards. Angelina Ok, RN Oct 26, 2009 11:05 AM

## 2010-07-23 NOTE — Progress Notes (Signed)
Summary: refill/ hla  Phone Note Refill Request Message from:  Patient on August 27, 2009 10:08 AM  Refills Requested: Medication #1:  VICODIN ES 7.5-750 MG TABS Take 1 tablet by mouth four times a day as needed for pain   Dosage confirmed as above?Dosage Confirmed   Last Refilled: 2/7  Medication #2:  LISINOPRIL 2.5 MG TABS Take 1 tablet by mouth once a day.   Dosage confirmed as above?Dosage Confirmed Initial call taken by: Marin Roberts RN,  August 27, 2009 10:09 AM  Follow-up for Phone Call        Rx completed in Dr. Tiajuana Amass Follow-up by: Jackson Latino MD,  August 27, 2009 1:53 PM    Prescriptions: LISINOPRIL 2.5 MG TABS (LISINOPRIL) Take 1 tablet by mouth once a day  #31 x 11   Entered and Authorized by:   Jackson Latino MD   Signed by:   Jackson Latino MD on 08/27/2009   Method used:   Telephoned to ...       [facility] [place] [phone]* (retail)       [phone] [address], [place]  [address]       Ph: [phone] or [phone]       Fax: [phone]   RxID:   [id number] VICODIN ES 7.5-750 MG TABS (HYDROCODONE-ACETAMINOPHEN) Take 1 tablet by mouth four times a day as needed for pain  #120 x 0   Entered and Authorized by:   Jackson Latino MD   Signed by:   Jackson Latino MD on 08/27/2009   Method used:   Telephoned to ...       [facility] [place] [phone]* (retail)       [phone] [address]       [place], [place]  [address]       Ph: [phone] or [phone]       Fax: [phone]   RxID:   [id number]

## 2010-07-23 NOTE — Progress Notes (Signed)
Summary: refill/ hla  Phone Note Refill Request Message from:  Patient on February 26, 2010 12:39 PM  Refills Requested: Medication #1:  VICODIN ES 7.5-750 MG TABS Take 1 tablet by mouth four times a day as needed for pain   Dosage confirmed as above?Dosage Confirmed   Last Refilled: 8/5 last visit 09/2009, next appt 04/2010  Initial call taken by: Marin Roberts RN,  February 26, 2010 12:39 PM  Follow-up for Phone Call        Refills now q 30 days whereas it had been indicated an RX would last for quite a while. So ran name through [facility] narc database and no concerns. Will refill Follow-up by: Blanch Media MD,  February 26, 2010 1:49 PM  Additional Follow-up for Phone Call Additional follow up Details #1::        Thanks to Dr. Rogelia Boga for this.    Prescriptions: VICODIN ES 7.5-750 MG TABS (HYDROCODONE-ACETAMINOPHEN) Take 1 tablet by mouth four times a day as needed for pain  #120 x 0   Entered and Authorized by:   Blanch Media MD   Signed by:   Blanch Media MD on 02/26/2010   Method used:   Telephoned to ...       [facility] [place] [phone]* (retail)       [phone] [address]       [place], [place]  [address]       Ph: [phone] or [phone]       Fax: [phone]   RxID:   [id number]

## 2010-07-23 NOTE — Assessment & Plan Note (Signed)
Summary: FLU/SB.  Nurse Visit   Allergies: No Known Drug Allergies  Immunizations Administered:  Influenza Vaccine # 1:    Vaccine Type: Fluvax MCR    Site: left deltoid    Mfr: GlaxoSmithKline    Dose: 0.5 ml    Route: IM    Given by: Marin Roberts RN    Exp. Date: 12/21/2010    Lot #: [id number]    VIS given: 01/15/10 version given May 09, 2010.    Physician counseled: yes  Flu Vaccine Consent Questions:    Do you have a history of severe allergic reactions to this vaccine? no    Any prior history of allergic reactions to egg and/or gelatin? no    Do you have a sensitivity to the preservative Thimersol? no    Do you have a past history of Guillan-Barre Syndrome? no    Do you currently have an acute febrile illness? no    Have you ever had a severe reaction to latex? no    Vaccine information given and explained to patient? yes  Orders Added: 1)  Influenza Vaccine MCR [00025]

## 2010-07-23 NOTE — Progress Notes (Signed)
Summary: Diabetic testing supplies/gg  Phone Note Call from Patient   Summary of Call: Pt called to let us know he is changing the company that sends out  his diabetic supplies. New company is  "[employer]" . # [phone] company called and will refax request. Initial call taken by: Merrie Roof RN,  October 08, 2009 12:28 PM

## 2010-07-23 NOTE — Assessment & Plan Note (Signed)
Summary: EST-ROUTINE CHECK/CH   Vital Signs:  Patient profile:   60 year old male Height:      73 inches (185.42 cm) Weight:      225.1 pounds (102.32 kg) BMI:     29.81 Temp:     98.5 degrees F (36.94 degrees C) oral Pulse rate:   89 / minute BP sitting:   130 / 80  (right arm)  Vitals Entered By: Cynda Familia Duncan Dull) (May 15, 2010 9:57 AM) CC: here to f/u dm, also c/o left shoulder pain off and on x Is Patient Diabetic? Yes Did you bring your meter with you today? No Pain Assessment Patient in pain? yes     Location: left shoulder CBG Result 137  Have you ever been in a relationship where you felt threatened, hurt or afraid?No   Does patient need assistance? Functional Status Self care Ambulation Normal   Diabetic Foot Exam Foot Inspection Is there a history of a foot ulcer?              No Is there a foot ulcer now?              No Can the patient see the bottom of their feet?          Yes Are the shoes appropriate in style and fit?          Yes Is there swelling or an abnormal foot shape?          No Are the toenails long?                Yes Are the toenails thick?                Yes Are the toenails ingrown?              No Is there heavy callous build-up?              No  Diabetic Foot Care Education Patient educated on appropriate care of diabetic feet.  Pulse Check          Right Foot          Left Foot Posterior Tibial:        normal            normal Dorsalis Pedis:        normal            normal  High Risk Feet? No Set Next Diabetic Foot Exam here: 05/16/2011   10-g (5.07) Semmes-Weinstein Monofilament Test Performed by: Lynn Ito          Right Foot          Left Foot Site 1         normal         normal Site 2         normal         normal Site 3         normal         normal Site 4         normal         normal Site 5         normal         normal Site 6         normal         normal  Impression      normal          normal  Primary Care Bernadette Gores:  Jackson Latino MD  CC:  here to f/u dm and also c/o left shoulder pain off and on x .  History of Present Illness: MPt is a 60 yo AAM with PMH of DM type 2, HTN, HLD and OA came here for regular f/u. He has been doing well execpt bilateral knee pain which is controlled well on vicodin and arthotec. He has no other c/o including CP, SOB, or fever.  He has been taking all his medications as instructed, denies smoking, ETOH or drug use. No diarrhea, dysuria or foot infection. His CBG usually runs 120s.   Preventive Screening-Counseling & Management  Alcohol-Tobacco     Smoking Status: quit     Smoking Cessation Counseling: yes     Packs/Day: 1/2     Year Started: 1962     Year Quit: 8/ 2008  Problems Prior to Update: 1)  Preventive Health Care  (ICD-V70.0) 2)  Lipoma, Skin  (ICD-214.1) 3)  Hyperlipidemia  (ICD-272.4) 4)  Diabetes Mellitus, Type II  (ICD-250.00) 5)  Psvt  (ICD-427.0) 6)  Essential Hypertension, Benign  (ICD-401.1) 7)  Hx, Personal, Tobacco Use  (ICD-V15.82) 8)  Preventive Health Care  (ICD-V70.0) 9)  COPD  (ICD-496) 10)  Erectile Dysfunction  (ICD-302.72) 11)  Emphysema  (ICD-492.8) 12)  Osteoarthritis  (ICD-715.90) 13)  Baker's Cyst, Left Knee  (ICD-727.51)  Medications Prior to Update: 1)  Spiriva Handihaler 18 Mcg Caps (Tiotropium Bromide Monohydrate) .... Inhale The Contents of 1 Capsule Once Daily 2)  Advair Diskus 250-50 Mcg/dose Aepb (Fluticasone-Salmeterol) .... Take 1 Puff Twice Daily 3)  Albuterol 90 Mcg/act Aers (Albuterol) .... Inhale 2 Puffs Four Times A Day As Needed 4)  Arthrotec 50 50-200 Mg-Mcg Tabs (Diclofenac-Misoprostol) .... Take 1 Tablet By Mouth Two Times A Day 5)  Vicodin Es 7.5-750 Mg Tabs (Hydrocodone-Acetaminophen) .... Take 1 Tablet By Mouth Four Times A Day As Needed For Pain 6)  Viagra 25 Mg Tabs (Sildenafil Citrate) .... Use As Directed 7)  Cardizem Cd 240 Mg Xr24h-Cap (Diltiazem Hcl Coated  Beads) .... Take 1 Tablet By Mouth Once A Day 8)  Zocor 40 Mg Tabs (Simvastatin) .... Take 1 Tablet By Mouth Once A Day 9)  Anacin 81 Mg Tbec (Aspirin) .... Take 1 Tablet By Mouth Once A Day 10)  Glipizide 5 Mg Tabs (Glipizide) .... Take 1 Tablet By Mouth Once A Day 11)  Metformin Hcl 500 Mg Tabs (Metformin Hcl) .... Take 1 Tablet By Mouth Two Times A Day 12)  Lisinopril 2.5 Mg Tabs (Lisinopril) .... Take 1 Tablet By Mouth Once A Day  Current Medications (verified): 1)  Spiriva Handihaler 18 Mcg Caps (Tiotropium Bromide Monohydrate) .... Inhale The Contents of 1 Capsule Once Daily 2)  Advair Diskus 250-50 Mcg/dose Aepb (Fluticasone-Salmeterol) .... Take 1 Puff Twice Daily 3)  Albuterol 90 Mcg/act Aers (Albuterol) .... Inhale 2 Puffs Four Times A Day As Needed 4)  Arthrotec 50 50-200 Mg-Mcg Tabs (Diclofenac-Misoprostol) .... Take 1 Tablet By Mouth Two Times A Day 5)  Vicodin Es 7.5-750 Mg Tabs (Hydrocodone-Acetaminophen) .... Take 1 Tablet By Mouth Four Times A Day As Needed For Pain 6)  Viagra 25 Mg Tabs (Sildenafil Citrate) .... Use As Directed 7)  Cardizem Cd 240 Mg Xr24h-Cap (Diltiazem Hcl Coated Beads) .... Take 1 Tablet By Mouth Once A Day 8)  Zocor 40 Mg Tabs (Simvastatin) .... Take 1 Tablet By Mouth Once A Day 9)  Anacin 81 Mg Tbec (Aspirin) .... Take 1 Tablet  By Mouth Once A Day 10)  Glipizide 5 Mg Tabs (Glipizide) .... Take 1 Tablet By Mouth Once A Day 11)  Metformin Hcl 500 Mg Tabs (Metformin Hcl) .... Take 1 Tablet By Mouth Two Times A Day 12)  Lisinopril 2.5 Mg Tabs (Lisinopril) .... Take 1 Tablet By Mouth Once A Day  Allergies (verified): No Known Drug Allergies  Past History:  Past Medical History: Last updated: 11/12/2007 Osteoarthritis Severe Bilateral OA of knees History of Tobacco abuse Erectile dysfunction Chronic Knee Pain since 2004- has been to ortho clinic, completed PT , refused steroid injection- will probably knee TKR in future. Has seen otrthopedic for knee  replacement. COPD Baker's cyst, left knee- ruptured. Hx SVT, requiring adenosine in 2006  Family History: Last updated: 07/08/2007 Throat CA Mother Family History Liver disease ( Cirrhosis) Father  Social History: Last updated: 11/12/2007 Former Smoker  Risk Factors: Smoking Status: quit (05/15/2010) Packs/Day: 1/2 (05/15/2010)  Family History: Reviewed history from 07/08/2007 and no changes required. Throat CA Mother Family History Liver disease ( Cirrhosis) Father  Social History: Reviewed history from 11/12/2007 and no changes required. Former Smoker  Review of Systems  The patient denies fever, weight loss, weight gain, vision loss, chest pain, syncope, dyspnea on exertion, peripheral edema, prolonged cough, headaches, hemoptysis, abdominal pain, melena, and hematochezia.    Physical Exam  General:  alert, well-developed, well-nourished, and well-hydrated.   Head:  normocephalic.   Nose:  no nasal discharge.   Mouth:  pharynx pink and moist.   Neck:  supple.   Lungs:  normal respiratory effort, no accessory muscle use, normal breath sounds, no crackles, and no wheezes.   Heart:  normal rate, regular rhythm, no murmur, no gallop, and no JVD.   Abdomen:  soft, non-tender, normal bowel sounds, and no distention.   Msk:  normal ROM, no joint swelling, no joint warmth, and no redness over joints.  Both knees tenderness.  Pulses:  2+ Extremities:  No edema.  Neurologic:  alert & oriented X3 and gait normal.    Diabetes Management Exam:    Foot Exam (with socks and/or shoes not present):       Sensory-Monofilament:          Left foot: normal          Right foot: normal   Impression & Recommendations:  Problem # 1:  DIABETES MELLITUS, TYPE II (ICD-250.00) Assessment Unchanged DM well controlled and will have foot exam today and have eye referral for annual DM exam. No change is needed.  His updated medication list for this problem includes:    Anacin 81 Mg Tbec  (Aspirin) .Marland Kitchen... Take 1 tablet by mouth once a day    Glipizide 5 Mg Tabs (Glipizide) .Marland Kitchen... Take 1 tablet by mouth once a day    Metformin Hcl 500 Mg Tabs (Metformin hcl) .Marland Kitchen... Take 1 tablet by mouth two times a day    Lisinopril 2.5 Mg Tabs (Lisinopril) .Marland Kitchen... Take 1 tablet by mouth once a day  Orders: T- Capillary Blood Glucose (69485) T-Hgb A1C (in-house) (46270JJ)  Labs Reviewed: Creat: 0.96 (09/27/2009)     Last Eye Exam: Results: No Diabetic retinopathy, early Hypertensive changes.  (09/27/2008) Reviewed HgBA1c results: 6.1 (08/15/2009)  5.9 (03/14/2009)  Labs Reviewed: Creat: 0.96 (09/27/2009)     Last Eye Exam: Results: No Diabetic retinopathy, early Hypertensive changes.  (09/27/2008) Reviewed HgBA1c results: 6.6 (05/15/2010)  6.1 (08/15/2009)  Problem # 2:  ESSENTIAL HYPERTENSION, BENIGN (ICD-401.1) Assessment: Unchanged BP well  controlled and continue current meds and check CMET.  His updated medication list for this problem includes:    Cardizem Cd 240 Mg Xr24h-cap (Diltiazem hcl coated beads) .Marland Kitchen... Take 1 tablet by mouth once a day    Lisinopril 2.5 Mg Tabs (Lisinopril) .Marland Kitchen... Take 1 tablet by mouth once a day  BP today: 130/80 Prior BP: 109/73 (09/27/2009)  Labs Reviewed: K+: 4.4 (09/27/2009) Creat: : 0.96 (09/27/2009)   Chol: 93 (11/09/2008)   HDL: 36 (11/09/2008)   LDL: 30 (11/09/2008)   TG: 135 (11/09/2008)  Orders: T-CMP with Estimated GFR ([address])  Problem # 3:  OSTEOARTHRITIS (ICD-715.90) Assessment: Unchanged  He has been on vicodin usually takes 1 tab three times a day. Have discussed with pt and would like to try tramadol to see if it will control his knee pain well in addition of arthrotec.  The following medications were removed from the medication list:    Vicodin Es 7.5-750 Mg Tabs (Hydrocodone-acetaminophen) .Marland Kitchen... Take 1 tablet by mouth four times a day as needed for pain His updated medication list for this problem includes:    Arthrotec  50 50-200 Mg-mcg Tabs (Diclofenac-misoprostol) .Marland Kitchen... Take 1 tablet by mouth two times a day    Anacin 81 Mg Tbec (Aspirin) .Marland Kitchen... Take 1 tablet by mouth once a day    Tramadol Hcl 100 Mg Xr24h-tab (Tramadol hcl) .Marland Kitchen... Take 1 tablet by mouth once a day  Discussed use of medications, application of heat or cold, and exercises.   Problem # 4:  HYPERLIPIDEMIA (ICD-272.4) Assessment: Unchanged Well controlled and continue statin and will check CMET.  His updated medication list for this problem includes:    Zocor 40 Mg Tabs (Simvastatin) .Marland Kitchen... Take 1 tablet by mouth once a day  Orders: T-CMP with Estimated GFR ([address])  Labs Reviewed: SGOT: 15 (09/27/2009)   SGPT: 10 (09/27/2009)   HDL:36 (11/09/2008), 50 (10/14/2007)  LDL:30 (11/09/2008), 103 (78/29/5621)  Chol:93 (11/09/2008), 165 (10/14/2007)  Trig:135 (11/09/2008), 58 (10/14/2007)  Complete Medication List: 1)  Spiriva Handihaler 18 Mcg Caps (Tiotropium bromide monohydrate) .... Inhale the contents of 1 capsule once daily 2)  Advair Diskus 250-50 Mcg/dose Aepb (Fluticasone-salmeterol) .... Take 1 puff twice daily 3)  Albuterol 90 Mcg/act Aers (Albuterol) .... Inhale 2 puffs four times a day as needed 4)  Arthrotec 50 50-200 Mg-mcg Tabs (Diclofenac-misoprostol) .... Take 1 tablet by mouth two times a day 5)  Viagra 25 Mg Tabs (Sildenafil citrate) .... Use as directed 6)  Cardizem Cd 240 Mg Xr24h-cap (Diltiazem hcl coated beads) .... Take 1 tablet by mouth once a day 7)  Zocor 40 Mg Tabs (Simvastatin) .... Take 1 tablet by mouth once a day 8)  Anacin 81 Mg Tbec (Aspirin) .... Take 1 tablet by mouth once a day 9)  Glipizide 5 Mg Tabs (Glipizide) .... Take 1 tablet by mouth once a day 10)  Metformin Hcl 500 Mg Tabs (Metformin hcl) .... Take 1 tablet by mouth two times a day 11)  Lisinopril 2.5 Mg Tabs (Lisinopril) .... Take 1 tablet by mouth once a day 12)  Tramadol Hcl 100 Mg Xr24h-tab (Tramadol hcl) .... Take 1 tablet by mouth once a  day  Other Orders: T-Hemoccult Card-Multiple (take home) (30865)   Patient Instructions: 1)  Please schedule a follow-up appointment in 6 months. 2)  Check your blood sugars regularly. If your readings are usually above : or below 70 you should contact our office. 3)  See your eye doctor yearly to check for  diabetic eye damage. 4)  Check your Blood Pressure regularly. If it is above: you should make an appointment. Prescriptions: TRAMADOL HCL 100 MG XR24H-TAB (TRAMADOL HCL) Take 1 tablet by mouth once a day  #31 x 2   Entered and Authorized by:   Jackson Latino MD   Signed by:   Jackson Latino MD on 05/15/2010   Method used:   [facility]        [facility] [phone][facility] (retail)       [address] [place]  [address]       Ph: [phone] or [phone]       Fax: [phone]   RxID:   [id number]    Orders Added: 1)  T- Capillary Blood Glucose [82948] 2)  T-Hgb A1C (in-house) [16073XT] 3)  T-Hemoccult Card-Multiple (take home) [82270] 4)  T-CMP with Estimated GFR [[address]] 5)  Est. Patient Level III [06269]   Process Orders Check Orders Results:     [facility]: Check successful Tests Sent for requisitioning (May 15, 2010 2:00 PM):     05/15/2010: [facility] -- T-CMP with Estimated GFR [[id number]] (signed)     Prevention & Chronic Care Immunizations   Influenza vaccine: Fluvax MCR  (05/09/2010)    Tetanus booster: Not documented    Pneumococcal vaccine: Not documented    H. zoster vaccine: Not documented  Colorectal Screening   Hemoccult: Not documented   Hemoccult action/deferral: Ordered  (05/15/2010)    Colonoscopy: Results:55mm sessile  Polyp.  Results: Diverticulosis & int. & ext. hemmoroids. Results: Specimen sent for pathology.      (12/28/2006)   Colonoscopy due: 12/2011  Other Screening   PSA: 0.23  (09/27/2009)   PSA action/deferral: Discussed-PSA requested   (09/27/2009)   Smoking status: quit  (05/15/2010)  Diabetes Mellitus   HgbA1C: 6.6  (05/15/2010)   HgbA1C action/deferral: Ordered  (03/14/2009)    Eye exam: Results: No Diabetic retinopathy, early Hypertensive changes.   (09/27/2008)   Diabetic eye exam action/deferral: Ophthalmology referral  (09/27/2009)   Eye exam due: 09/2009    Foot exam: yes  (05/15/2010)   Foot exam action/deferral: Do today   High risk foot: No  (05/15/2010)   Foot care education: Done  (05/15/2010)   Foot exam due: 05/16/2011    Urine microalbumin/creatinine ratio: 39.8  (09/27/2009)   Urine microalbumin action/deferral: Ordered    Diabetes flowsheet reviewed?: Yes   Progress toward A1C goal: At goal  Lipids   Total Cholesterol: 93  (11/09/2008)   LDL: 30  (11/09/2008)   LDL Direct: Not documented   HDL: 36  (11/09/2008)   Triglycerides: 135  (11/09/2008)    SGOT (AST): 15  (09/27/2009)   BMP action: Ordered   SGPT (ALT): 10  (09/27/2009)   Alkaline phosphatase: 79  (09/27/2009)   Total bilirubin: 0.2  (09/27/2009)  Hypertension   Last Blood Pressure: 130 / 80  (05/15/2010)   Serum creatinine: 0.96  (09/27/2009)   BMP action: Ordered   Serum potassium 4.4  (09/27/2009)  Self-Management Support :   Personal Goals (by the next clinic visit) :     Personal A1C goal: 7  (03/14/2009)     Personal blood pressure goal: 130/80  (03/14/2009)     Personal LDL goal: 100  (03/14/2009)    Diabetes self-management support: Resources for patients handout, Written self-care plan  (09/27/2009)   Last diabetes self-management training by diabetes educator: 11/09/2008  Last medical nutrition therapy: 06/28/2008    Hypertension self-management support: Resources for patients handout, Written self-care plan  (09/27/2009)    Lipid self-management support: Resources for patients handout, Written self-care plan  (09/27/2009)    Nursing Instructions: Diabetic foot exam today Provide Hemoccult cards  with instructions (see order)   Laboratory Results   Blood Tests   Date/Time Received: May 15, 2010 10:28 AM  Date/Time Reported: Burke Keels  May 15, 2010 10:28 AM   HGBA1C: 6.6%   (Normal Range: Non-Diabetic - 3-6%   Control Diabetic - 6-8%) CBG Random:: 137mg /dL

## 2010-07-23 NOTE — Progress Notes (Signed)
Summary: refill/ hla  Phone Note Refill Request Message from:  Patient on March 27, 2010 2:55 PM  Refills Requested: Medication #1:  VICODIN ES 7.5-750 MG TABS Take 1 tablet by mouth four times a day as needed for pain   Last Refilled: 9/6 9/6 1209 pt called again  Initial call taken by: Marin Roberts RN,  March 27, 2010 2:55 PM  Follow-up for Phone Call        Rx completed in Dr. Tiajuana Amass Follow-up by: Jackson Latino MD,  March 28, 2010 8:47 PM    Prescriptions: VICODIN ES 7.5-750 MG TABS (HYDROCODONE-ACETAMINOPHEN) Take 1 tablet by mouth four times a day as needed for pain  #120 x 0   Entered and Authorized by:   Jackson Latino MD   Signed by:   Jackson Latino MD on 03/29/2010   Method used:   Print then Give to Patient   RxID:   [id number]   Appended Document: refill/ hla called to pharm, paper script destroyed

## 2010-07-23 NOTE — Progress Notes (Signed)
Summary: refill/ hla  Phone Note Refill Request Message from:  Patient on April 29, 2010 4:11 PM  Refills Requested: Medication #1:  VICODIN ES 7.5-750 MG TABS Take 1 tablet by mouth four times a day as needed for pain   Dosage confirmed as above?Dosage Confirmed   Last Refilled: 10/7 Initial call taken by: Marin Roberts RN,  April 29, 2010 4:11 PM  Follow-up for Phone Call        Rx completed in Dr. Tiajuana Amass Follow-up by: Jackson Latino MD,  April 29, 2010 8:56 PM    Prescriptions: VICODIN ES 7.5-750 MG TABS (HYDROCODONE-ACETAMINOPHEN) Take 1 tablet by mouth four times a day as needed for pain  #120 x 0   Entered and Authorized by:   Jackson Latino MD   Signed by:   Jackson Latino MD on 04/29/2010   Method used:   Telephoned to ...       [facility] [place] [phone]* (retail)       [phone] [address]       [place], [place]  [address]       Ph: [phone] or [phone]       Fax: [phone]   RxID:   [id number]

## 2010-07-23 NOTE — Letter (Signed)
Summary: HOME BLOOD GLUCOSE TESTING   HOME BLOOD GLUCOSE TESTING   Imported By: Margie Billet 10/19/2009 14:56:03  _____________________________________________________________________  External Attachment:    Type:   Image     Comment:   External Document

## 2010-07-23 NOTE — Assessment & Plan Note (Signed)
Summary: EST-R/S FROM 07-13-09 ROUTINE CHECKUP/CH   Vital Signs:  Patient profile:   60 year old male Height:      73 inches Weight:      223.2 pounds BMI:     29.55 Temp:     97.7 degrees F oral Pulse rate:   92 / minute BP sitting:   134 / 90  (right arm)  Vitals Entered By: Filomena Jungling NT II (August 15, 2009 1:31 PM) CC: ARM PAIN WITH A KNOT Is Patient Diabetic? Yes Did you bring your meter with you today? No Pain Assessment Patient in pain? yes     Location: arm Intensity: 7 Type: aching Nutritional Status BMI of 25 - 29 = overweight CBG Result 161  Does patient need assistance? Functional Status Self care Ambulation Normal   Primary Care Provider:  Jackson Latino MD  CC:  ARM PAIN WITH A KNOT.  History of Present Illness: Pt is a 60 yo AAM with PMH of DM type 2, HTN, HLD and OA came here for regular f/u and meds refill. He has been doing well, good appetite and exercise every day, no c/o including CP, SOB, or fever. He has a mass in his right forarm for about one month, about 0.5 , no pain or drainage. He has been taking all his medications as instructed, denies smoking, ETOH or drug use. No diarrhea, dysuria or foot infection.    Preventive Screening-Counseling & Management  Alcohol-Tobacco     Smoking Status: quit     Smoking Cessation Counseling: yes     Packs/Day: 1/2     Year Started: 1962     Year Quit: 8/ 2008  Caffeine-Diet-Exercise     Does Patient Exercise: yes     Type of exercise: WALKING     Times/week: 7  Problems Prior to Update: 1)  Hyperlipidemia  (ICD-272.4) 2)  Diabetes Mellitus, Type II  (ICD-250.00) 3)  Psvt  (ICD-427.0) 4)  Essential Hypertension, Benign  (ICD-401.1) 5)  Hx, Personal, Tobacco Use  (ICD-V15.82) 6)  Preventive Health Care  (ICD-V70.0) 7)  COPD  (ICD-496) 8)  Erectile Dysfunction  (ICD-302.72) 9)  Emphysema  (ICD-492.8) 10)  Osteoarthritis  (ICD-715.90) 11)  Baker's Cyst, Left Knee   (ICD-727.51)  Medications Prior to Update: 1)  Spiriva Handihaler 18 Mcg Caps (Tiotropium Bromide Monohydrate) .... Inhale The Contents of 1 Capsule Once Daily 2)  Advair Diskus 100-50 Mcg/dose Misc (Fluticasone-Salmeterol) .... Inhale 1 Puff Twice A Day 3)  Albuterol 90 Mcg/act Aers (Albuterol) .... Inhale 2 Puffs Four Times A Day As Needed 4)  Arthrotec 50 50-200 Mg-Mcg Tabs (Diclofenac-Misoprostol) .... Take 1 Tablet By Mouth Two Times A Day 5)  Vicodin Es 7.5-750 Mg Tabs (Hydrocodone-Acetaminophen) .... Take 1 Tablet By Mouth Four Times A Day As Needed For Pain 6)  Viagra 25 Mg Tabs (Sildenafil Citrate) .... Use As Directed 7)  Cardizem Cd 240 Mg Xr24h-Cap (Diltiazem Hcl Coated Beads) .... Take 1 Tablet By Mouth Once A Day 8)  Zocor 40 Mg Tabs (Simvastatin) .... Take 1 Tablet By Mouth Once A Day 9)  Anacin 81 Mg Tbec (Aspirin) .... Take 1 Tablet By Mouth Once A Day 10)  Glipizide 5 Mg Tabs (Glipizide) .... Take 1 Tablet By Mouth Once A Day 11)  Metformin Hcl 500 Mg Tabs (Metformin Hcl) .... Take 1 Tablet By Mouth Two Times A Day 12)  Lisinopril 2.5 Mg Tabs (Lisinopril) .... Take 1 Tablet By Mouth Once A Day  Current Medications (  verified): 1)  Spiriva Handihaler 18 Mcg Caps (Tiotropium Bromide Monohydrate) .... Inhale The Contents of 1 Capsule Once Daily 2)  Advair Diskus 100-50 Mcg/dose Misc (Fluticasone-Salmeterol) .... Inhale 1 Puff Twice A Day 3)  Albuterol 90 Mcg/act Aers (Albuterol) .... Inhale 2 Puffs Four Times A Day As Needed 4)  Arthrotec 50 50-200 Mg-Mcg Tabs (Diclofenac-Misoprostol) .... Take 1 Tablet By Mouth Two Times A Day 5)  Vicodin Es 7.5-750 Mg Tabs (Hydrocodone-Acetaminophen) .... Take 1 Tablet By Mouth Four Times A Day As Needed For Pain 6)  Viagra 25 Mg Tabs (Sildenafil Citrate) .... Use As Directed 7)  Cardizem Cd 240 Mg Xr24h-Cap (Diltiazem Hcl Coated Beads) .... Take 1 Tablet By Mouth Once A Day 8)  Zocor 40 Mg Tabs (Simvastatin) .... Take 1 Tablet By Mouth Once A  Day 9)  Anacin 81 Mg Tbec (Aspirin) .... Take 1 Tablet By Mouth Once A Day 10)  Glipizide 5 Mg Tabs (Glipizide) .... Take 1 Tablet By Mouth Once A Day 11)  Metformin Hcl 500 Mg Tabs (Metformin Hcl) .... Take 1 Tablet By Mouth Two Times A Day 12)  Lisinopril 2.5 Mg Tabs (Lisinopril) .... Take 1 Tablet By Mouth Once A Day  Allergies (verified): No Known Drug Allergies  Past History:  Past Medical History: Last updated: 11/12/2007 Osteoarthritis Severe Bilateral OA of knees History of Tobacco abuse Erectile dysfunction Chronic Knee Pain since 2004- has been to ortho clinic, completed PT , refused steroid injection- will probably knee TKR in future. Has seen otrthopedic for knee replacement. COPD Baker's cyst, left knee- ruptured. Hx SVT, requiring adenosine in 2006  Family History: Last updated: 07/08/2007 Throat CA Mother Family History Liver disease ( Cirrhosis) Father  Social History: Last updated: 11/12/2007 Former Smoker  Risk Factors: Exercise: yes (08/15/2009)  Risk Factors: Smoking Status: quit (08/15/2009) Packs/Day: 1/2 (08/15/2009)  Family History: Reviewed history from 07/08/2007 and no changes required. Throat CA Mother Family History Liver disease ( Cirrhosis) Father  Social History: Reviewed history from 11/12/2007 and no changes required. Former Smoker  Review of Systems       The patient complains of weight gain.  The patient denies anorexia, fever, weight loss, hoarseness, chest pain, syncope, dyspnea on exertion, peripheral edema, prolonged cough, hemoptysis, abdominal pain, and melena.    Physical Exam  General:  alert, well-developed, well-nourished, well-hydrated, and overweight-appearing.   Head:  normocephalic.   Ears:  no external deformities.   Nose:  no external erythema.   Mouth:  pharynx pink and moist.   Neck:  supple.   Lungs:  normal respiratory effort, normal breath sounds, no crackles, and no wheezes.   Heart:  normal rate,  regular rhythm, no murmur, no gallop, and no JVD.   Abdomen:  soft, non-tender, normal bowel sounds, no distention, and no masses.   Msk:  normal ROM, no joint tenderness, no joint swelling, no joint warmth, and no redness over joints.   Pulses:  2+ Extremities:  Right forearm a 0.5 cm regular subcutaneous mass, moveable, no erythema or swelling. Neurologic:  alert & oriented X3, cranial nerves II-XII intact, strength normal in all extremities, sensation intact to light touch, and gait normal.     Impression & Recommendations:  Problem # 1:  DIABETES MELLITUS, TYPE II (ICD-250.00) Assessment Unchanged His CBG has well controlled and home CBG less than 200, no hypoglycemia episode. Current A1C 6.1. Will continue current medications. His updated medication list for this problem includes:    Anacin 81 Mg Tbec (  Aspirin) .Marland Kitchen... Take 1 tablet by mouth once a day    Glipizide 5 Mg Tabs (Glipizide) .Marland Kitchen... Take 1 tablet by mouth once a day    Metformin Hcl 500 Mg Tabs (Metformin hcl) .Marland Kitchen... Take 1 tablet by mouth two times a day    Lisinopril 2.5 Mg Tabs (Lisinopril) .Marland Kitchen... Take 1 tablet by mouth once a day  Orders: T- Capillary Blood Glucose (99833) T-Hgb A1C (in-house) (82505LZ)  Labs Reviewed: Creat: 0.90 (11/09/2008)     Last Eye Exam: Results: No Diabetic retinopathy, early Hypertensive changes.  (09/27/2008) Reviewed HgBA1c results: 6.1 (08/15/2009)  5.9 (03/14/2009)  Problem # 2:  COPD (ICD-496) Assessment: Unchanged He has no SOB or cough, fever. No wheezing appreciated. Will continue current meds.  His updated medication list for this problem includes:    Spiriva Handihaler 18 Mcg Caps (Tiotropium bromide monohydrate) ..... Inhale the contents of 1 capsule once daily    Advair Diskus 100-50 Mcg/dose Misc (Fluticasone-salmeterol) ..... Inhale 1 puff twice a day    Albuterol 90 Mcg/act Aers (Albuterol) ..... Inhale 2 puffs four times a day as needed  Pulmonary Functions Reviewed: O2  sat: 96 (04/24/2008)     Vaccines Reviewed: Flu Vax: Fluvax MCR (03/14/2009)  Problem # 3:  ESSENTIAL HYPERTENSION, BENIGN (ICD-401.1) Assessment: Unchanged BP at goal and will continue current meds and will check BMET at next visit.  His updated medication list for this problem includes:    Cardizem Cd 240 Mg Xr24h-cap (Diltiazem hcl coated beads) .Marland Kitchen... Take 1 tablet by mouth once a day    Lisinopril 2.5 Mg Tabs (Lisinopril) .Marland Kitchen... Take 1 tablet by mouth once a day  BP today: 134/90 Prior BP: 119/81 (03/14/2009)  Labs Reviewed: K+: 4.1 (11/09/2008) Creat: : 0.90 (11/09/2008)   Chol: 93 (11/09/2008)   HDL: 36 (11/09/2008)   LDL: 30 (11/09/2008)   TG: 135 (11/09/2008)  Problem # 4:  OSTEOARTHRITIS (ICD-715.90) Assessment: Unchanged He still has multiple joint pain, esp on moving or cold weather. Advised to use of pain meds and heat/icy pad.    His updated medication list for this problem includes:    Arthrotec 50 50-200 Mg-mcg Tabs (Diclofenac-misoprostol) .Marland Kitchen... Take 1 tablet by mouth two times a day    Vicodin Es 7.5-750 Mg Tabs (Hydrocodone-acetaminophen) .Marland Kitchen... Take 1 tablet by mouth four times a day as needed for pain    Anacin 81 Mg Tbec (Aspirin) .Marland Kitchen... Take 1 tablet by mouth once a day  Discussed use of medications, application of heat or cold, and exercises.   Problem # 5:  LIPOMA, SKIN (ICD-214.1) Assessment: New The mass in his right forearm is likely lipoma. It is small and asymptomatic and will monitor its growth.    Complete Medication List: 1)  Spiriva Handihaler 18 Mcg Caps (Tiotropium bromide monohydrate) .... Inhale the contents of 1 capsule once daily 2)  Advair Diskus 100-50 Mcg/dose Misc (Fluticasone-salmeterol) .... Inhale 1 puff twice a day 3)  Albuterol 90 Mcg/act Aers (Albuterol) .... Inhale 2 puffs four times a day as needed 4)  Arthrotec 50 50-200 Mg-mcg Tabs (Diclofenac-misoprostol) .... Take 1 tablet by mouth two times a day 5)  Vicodin Es 7.5-750 Mg  Tabs (Hydrocodone-acetaminophen) .... Take 1 tablet by mouth four times a day as needed for pain 6)  Viagra 25 Mg Tabs (Sildenafil citrate) .... Use as directed 7)  Cardizem Cd 240 Mg Xr24h-cap (Diltiazem hcl coated beads) .... Take 1 tablet by mouth once a day 8)  Zocor 40 Mg Tabs (  Simvastatin) .... Take 1 tablet by mouth once a day 9)  Anacin 81 Mg Tbec (Aspirin) .... Take 1 tablet by mouth once a day 10)  Glipizide 5 Mg Tabs (Glipizide) .... Take 1 tablet by mouth once a day 11)  Metformin Hcl 500 Mg Tabs (Metformin hcl) .... Take 1 tablet by mouth two times a day 12)  Lisinopril 2.5 Mg Tabs (Lisinopril) .... Take 1 tablet by mouth once a day  Patient Instructions: 1)  Please schedule a follow-up appointment in 6 months. 2)  It is important that you exercise regularly at least 20 minutes 5 times a week. If you develop chest pain, have severe difficulty breathing, or feel very tired , stop exercising immediately and seek medical attention. 3)  You need to lose weight. Consider a lower calorie diet and regular exercise.  Prescriptions: ARTHROTEC 50 50-200 MG-MCG TABS (DICLOFENAC-MISOPROSTOL) Take 1 tablet by mouth two times a day  #60 x 6   Entered and Authorized by:   Jackson Latino MD   Signed by:   Jackson Latino MD on 08/15/2009   Method used:   [facility] to        [facility] [phone]* (retail)       [address] [place]  [address]       Ph: [phone] or [phone]       Fax: [phone]   RxID:   [id number]     Prevention & Chronic Care Immunizations   Influenza vaccine: Fluvax MCR  (03/14/2009)    Tetanus booster: Not documented    Pneumococcal vaccine: Not documented  Colorectal Screening   Hemoccult: Not documented    Colonoscopy: Results:8mm sessile  Polyp.  Results: Diverticulosis & int. & ext. hemmoroids. Results: Specimen sent for pathology.      (12/28/2006)   Colonoscopy due: 12/2011  Other Screening   PSA: 0.89   (07/28/2006)   Smoking status: quit  (08/15/2009)  Diabetes Mellitus   HgbA1C: 6.1  (08/15/2009)   HgbA1C action/deferral: Ordered  (03/14/2009)    Eye exam: Results: No Diabetic retinopathy, early Hypertensive changes.   (09/27/2008)   Eye exam due: 09/2009    Foot exam: yes  (03/14/2009)   Foot exam action/deferral: Do today   High risk foot: No  (11/09/2008)   Foot care education: Not documented    Urine microalbumin/creatinine ratio: 38.5  (06/28/2008)    Diabetes flowsheet reviewed?: Yes   Progress toward A1C goal: At goal  Lipids   Total Cholesterol: 93  (11/09/2008)   LDL: 30  (11/09/2008)   LDL Direct: Not documented   HDL: 36  (11/09/2008)   Triglycerides: 135  (11/09/2008)    SGOT (AST): 16  (11/09/2008)   SGPT (ALT): 8  (11/09/2008)   Alkaline phosphatase: 73  (11/09/2008)   Total bilirubin: 0.3  (11/09/2008)    Lipid flowsheet reviewed?: Yes   Progress toward LDL goal: At goal  Hypertension   Last Blood Pressure: 134 / 90  (08/15/2009)   Serum creatinine: 0.90  (11/09/2008)   Serum potassium 4.1  (11/09/2008)    Hypertension flowsheet reviewed?: Yes   Progress toward BP goal: At goal  Self-Management Support :   Personal Goals (by the next clinic visit) :     Personal A1C goal: 7  (03/14/2009)     Personal blood pressure goal: 130/80  (03/14/2009)     Personal LDL goal: 100  (03/14/2009)    Patient will work  on the following items until the next clinic visit to reach self-care goals:     Medications and monitoring: take my medicines every day, check my blood sugar  (08/15/2009)     Eating: drink diet soda or water instead of juice or soda, eat more vegetables, use fresh or frozen vegetables, eat foods that are low in salt, eat baked foods instead of fried foods  (08/15/2009)     Activity: take a 30 minute walk every day  (08/15/2009)    Diabetes self-management support: Education handout, Resources for patients handout, Written self-care plan   (08/15/2009)   Diabetes care plan printed   Diabetes education handout printed   Last diabetes self-management training by diabetes educator: 11/09/2008   Last medical nutrition therapy: 06/28/2008    Hypertension self-management support: Education handout, Resources for patients handout, Written self-care plan  (08/15/2009)   Hypertension self-care plan printed.   Hypertension education handout printed    Lipid self-management support: Education handout, Resources for patients handout, Written self-care plan  (08/15/2009)   Lipid self-care plan printed.   Lipid education handout printed      Resource handout printed.  Laboratory Results   Blood Tests   Date/Time Received: August 15, 2009 2:05 PM Date/Time Reported: Alric Quan  August 15, 2009 2:05 PM   HGBA1C: 6.1%   (Normal Range: Non-Diabetic - 3-6%   Control Diabetic - 6-8%) CBG Random:: 161mg /dL

## 2010-07-23 NOTE — Assessment & Plan Note (Signed)
Summary: est-ck/fu/meds/cfb   Vital Signs:  Patient profile:   60 year old male Height:      73 inches (185.42 cm) Weight:      226.0 pounds (102.73 kg) BMI:     29.92 Temp:     98.7 degrees F (37.06 degrees C) oral Pulse rate:   81 / minute BP sitting:   109 / 73  (right arm) Cuff size:   regular  Vitals Entered By: Theotis Barrio NT II (September 27, 2009 2:50 PM) CC: ROUTNE OFFICE VISIT  /  DM  /  NO CONCERN NOR COMPLAINTS  /  WANT TO KNOW HIS BLOOD TYPE Is Patient Diabetic? Yes Did you bring your meter with you today? No Pain Assessment Patient in pain? no      Nutritional Status BMI of 25 - 29 = overweight  Have you ever been in a relationship where you felt threatened, hurt or afraid?No   Does patient need assistance? Functional Status Self care Ambulation Normal Comments ROUTINE OFFICE VISIT  /  DM  /  NO CONCERN NOR COMPLAINTS  / WANTS TO KNOW WHAT HIS BLOOD TYPE   Primary Care Provider:  Jackson Latino MD  CC:  ROUTNE OFFICE VISIT  /  DM  /  NO CONCERN NOR COMPLAINTS  /  WANT TO KNOW HIS BLOOD TYPE.  History of Present Illness: MPt is a 60 yo AAM with PMH of DM type 2, HTN, HLD and OA came here for regular f/u and meds refill. He has been doing well, good appetite and exercise every day, no c/o including CP, SOB, or fever.  He has been taking all his medications as instructed, denies smoking, ETOH or drug use. No diarrhea, dysuria or foot infection.   Current Medications (verified): 1)  Spiriva Handihaler 18 Mcg Caps (Tiotropium Bromide Monohydrate) .... Inhale The Contents of 1 Capsule Once Daily 2)  Advair Diskus 250-50 Mcg/dose Aepb (Fluticasone-Salmeterol) .... Take 1 Puff Twice Daily 3)  Albuterol 90 Mcg/act Aers (Albuterol) .... Inhale 2 Puffs Four Times A Day As Needed 4)  Arthrotec 50 50-200 Mg-Mcg Tabs (Diclofenac-Misoprostol) .... Take 1 Tablet By Mouth Two Times A Day 5)  Vicodin Es 7.5-750 Mg Tabs (Hydrocodone-Acetaminophen) .... Take 1 Tablet By Mouth  Four Times A Day As Needed For Pain 6)  Viagra 25 Mg Tabs (Sildenafil Citrate) .... Use As Directed 7)  Cardizem Cd 240 Mg Xr24h-Cap (Diltiazem Hcl Coated Beads) .... Take 1 Tablet By Mouth Once A Day 8)  Zocor 40 Mg Tabs (Simvastatin) .... Take 1 Tablet By Mouth Once A Day 9)  Anacin 81 Mg Tbec (Aspirin) .... Take 1 Tablet By Mouth Once A Day 10)  Glipizide 5 Mg Tabs (Glipizide) .... Take 1 Tablet By Mouth Once A Day 11)  Metformin Hcl 500 Mg Tabs (Metformin Hcl) .... Take 1 Tablet By Mouth Two Times A Day 12)  Lisinopril 2.5 Mg Tabs (Lisinopril) .... Take 1 Tablet By Mouth Once A Day  Allergies (verified): No Known Drug Allergies  Past History:  Past Medical History: Last updated: 11/12/2007 Osteoarthritis Severe Bilateral OA of knees History of Tobacco abuse Erectile dysfunction Chronic Knee Pain since 2004- has been to ortho clinic, completed PT , refused steroid injection- will probably knee TKR in future. Has seen otrthopedic for knee replacement. COPD Baker's cyst, left knee- ruptured. Hx SVT, requiring adenosine in 2006  Family History: Last updated: 07/08/2007 Throat CA Mother Family History Liver disease ( Cirrhosis) Father  Social History:  Last updated: 11/12/2007 Former Smoker  Risk Factors: Exercise: yes (08/15/2009)  Risk Factors: Smoking Status: quit (08/15/2009) Packs/Day: 1/2 (08/15/2009)  Review of Systems      See HPI  Physical Exam  General:  alert, well-developed, well-nourished, well-hydrated, and overweight-appearing.   Head:  normocephalic.   Eyes:  vision grossly intact, pupils equal, pupils round, and pupils reactive to light.   Ears:  no external deformities.   Nose:  no external erythema.   Mouth:  pharynx pink and moist.   Neck:  supple.   Lungs:  normal respiratory effort, normal breath sounds, no crackles, and no wheezes.   Heart:  normal rate, regular rhythm, no murmur, no gallop, and no JVD.   Abdomen:  soft, non-tender, normal  bowel sounds, no distention, and no masses.   Msk:  normal ROM, no joint tenderness, no joint swelling, no joint warmth, and no redness over joints.   Extremities:  Right forearm a 0.5 cm regular subcutaneous mass, moveable, no erythema or swelling. Neurologic:  alert & oriented X3, cranial nerves II-XII intact, strength normal in all extremities, sensation intact to light touch, and gait normal.    Diabetes Management Exam:    Foot Exam (with socks and/or shoes not present):       Sensory-Monofilament:          Left foot: normal   Impression & Recommendations:  Problem # 1:  DIABETES MELLITUS, TYPE II (ICD-250.00) stable and well controlled. Feet looks oK.will get opthal referral.  His updated medication list for this problem includes:    Anacin 81 Mg Tbec (Aspirin) .Marland Kitchen... Take 1 tablet by mouth once a day    Glipizide 5 Mg Tabs (Glipizide) .Marland Kitchen... Take 1 tablet by mouth once a day    Metformin Hcl 500 Mg Tabs (Metformin hcl) .Marland Kitchen... Take 1 tablet by mouth two times a day    Lisinopril 2.5 Mg Tabs (Lisinopril) .Marland Kitchen... Take 1 tablet by mouth once a day  Orders: T-Urine Microalbumin w/creat. ratio [phone]) T-CBC No Diff ([id number]) Ophthalmology Referral (Ophthalmology)  Labs Reviewed: Creat: 0.90 (11/09/2008)     Last Eye Exam: Results: No Diabetic retinopathy, early Hypertensive changes.  (09/27/2008) Reviewed HgBA1c results: 6.1 (08/15/2009)  5.9 (03/14/2009)  Problem # 2:  HYPERLIPIDEMIA (ICD-272.4) once again stable. Will get CMET for continued monitoring.  His updated medication list for this problem includes:    Zocor 40 Mg Tabs (Simvastatin) .Marland Kitchen... Take 1 tablet by mouth once a day  Labs Reviewed: SGOT: 16 (11/09/2008)   SGPT: 8 (11/09/2008)   HDL:36 (11/09/2008), 50 (10/14/2007)  LDL:30 (11/09/2008), 103 (57/84/6962)  Chol:93 (11/09/2008), 165 (10/14/2007)  Trig:135 (11/09/2008), 58 (10/14/2007)  Problem # 3:  HX, PERSONAL, TOBACCO USE (ICD-V15.82) has quit for  3 years and not using it anymore. I applauded his commitment.   Problem # 4:  COPD (ICD-496) Having progressive SOB. Has to use rescue inhaler every day 2-3 times. I will increase his advair dose in hope of reducing his need of rescue inhaler.  His updated medication list for this problem includes:    Spiriva Handihaler 18 Mcg Caps (Tiotropium bromide monohydrate) ..... Inhale the contents of 1 capsule once daily    Advair Diskus 250-50 Mcg/dose Aepb (Fluticasone-salmeterol) .Marland Kitchen... Take 1 puff twice daily    Albuterol 90 Mcg/act Aers (Albuterol) ..... Inhale 2 puffs four times a day as needed  Problem # 5:  PSVT (ICD-427.0)  He has history of paroxysmal arrythmia and he is maintained on diltiazem.  His  updated medication list for this problem includes:    Anacin 81 Mg Tbec (Aspirin) .Marland Kitchen... Take 1 tablet by mouth once a day  Labs Reviewed: Na: 142 (11/09/2008)   K+: 4.1 (11/09/2008)   CL: 107 (11/09/2008)   HCO3: 25 (11/09/2008) Mg: 1.9 (04/13/2008)   Ca: 9.8 (11/09/2008)   TSH: 0.765 (04/13/2008)   HCO3: 25 (11/09/2008)  Problem # 6:  OSTEOARTHRITIS (ICD-715.90) stable. At times pain is worse but for most part able to continue DAL without trouble.  His updated medication list for this problem includes:    Arthrotec 50 50-200 Mg-mcg Tabs (Diclofenac-misoprostol) .Marland Kitchen... Take 1 tablet by mouth two times a day    Vicodin Es 7.5-750 Mg Tabs (Hydrocodone-acetaminophen) .Marland Kitchen... Take 1 tablet by mouth four times a day as needed for pain    Anacin 81 Mg Tbec (Aspirin) .Marland Kitchen... Take 1 tablet by mouth once a day  Problem # 7:  Preventive Health Care (ICD-V70.0)  Reviewed preventive care protocols, scheduled due services, and updated immunizations.  Complete Medication List: 1)  Spiriva Handihaler 18 Mcg Caps (Tiotropium bromide monohydrate) .... Inhale the contents of 1 capsule once daily 2)  Advair Diskus 250-50 Mcg/dose Aepb (Fluticasone-salmeterol) .... Take 1 puff twice daily 3)  Albuterol 90  Mcg/act Aers (Albuterol) .... Inhale 2 puffs four times a day as needed 4)  Arthrotec 50 50-200 Mg-mcg Tabs (Diclofenac-misoprostol) .... Take 1 tablet by mouth two times a day 5)  Vicodin Es 7.5-750 Mg Tabs (Hydrocodone-acetaminophen) .... Take 1 tablet by mouth four times a day as needed for pain 6)  Viagra 25 Mg Tabs (Sildenafil citrate) .... Use as directed 7)  Cardizem Cd 240 Mg Xr24h-cap (Diltiazem hcl coated beads) .... Take 1 tablet by mouth once a day 8)  Zocor 40 Mg Tabs (Simvastatin) .... Take 1 tablet by mouth once a day 9)  Anacin 81 Mg Tbec (Aspirin) .... Take 1 tablet by mouth once a day 10)  Glipizide 5 Mg Tabs (Glipizide) .... Take 1 tablet by mouth once a day 11)  Metformin Hcl 500 Mg Tabs (Metformin hcl) .... Take 1 tablet by mouth two times a day 12)  Lisinopril 2.5 Mg Tabs (Lisinopril) .... Take 1 tablet by mouth once a day  Other Orders: T-Hemoccult Card-Multiple (take home) (31540) T-PSA Total ([id number]) T-Comprehensive Metabolic Panel ([id number])  Patient Instructions: 1)  Please schedule a follow-up appointment in 3 months. Prescriptions: ADVAIR DISKUS 250-50 MCG/DOSE AEPB (FLUTICASONE-SALMETEROL) take 1 puff twice daily  #1 x 3   Entered and Authorized by:   Clerance Lav MD   Signed by:   Clerance Lav MD on 09/27/2009   Method used:   [facility] to        [facility]. [phone]* (retail)       [address], [place]  [address]       Ph: [phone] or [phone]       Fax: [phone]   RxID:   [id number]  Process Orders Check Orders Results:     [facility]: Order checked:     23780 -- T-PSA Total -- ABN required due to diagnosis (CPT: [id number]) Tests Sent for requisitioning (September 27, 2009 4:31 PM):     09/27/2009: [facility] -- T-PSA Total [[id number]] (signed)     09/27/2009: [facility] -- T-Urine Microalbumin w/creat. ratio [[id number]] (signed)     09/27/2009:  [facility] -- T-Comprehensive Metabolic Panel [id number] (signed)  09/27/2009: [facility] -- T-CBC No Diff [[id number]] (signed)    Prevention & Chronic Care Immunizations   Influenza vaccine: Fluvax MCR  (03/14/2009)    Tetanus booster: Not documented    Pneumococcal vaccine: Not documented  Colorectal Screening   Hemoccult: Not documented   Hemoccult action/deferral: Ordered  (09/27/2009)    Colonoscopy: Results:14mm sessile  Polyp.  Results: Diverticulosis & int. & ext. hemmoroids. Results: Specimen sent for pathology.      (12/28/2006)   Colonoscopy due: 12/2011  Other Screening   PSA: 0.89  (07/28/2006)   PSA ordered.   PSA action/deferral: Discussed-PSA requested  (09/27/2009)   Smoking status: quit  (08/15/2009)  Diabetes Mellitus   HgbA1C: 6.1  (08/15/2009)   HgbA1C action/deferral: Ordered  (03/14/2009)    Eye exam: Results: No Diabetic retinopathy, early Hypertensive changes.   (09/27/2008)   Diabetic eye exam action/deferral: Ophthalmology referral  (09/27/2009)   Eye exam due: 09/2009    Foot exam: yes  (09/27/2009)   Foot exam action/deferral: Do today   High risk foot: No  (11/09/2008)   Foot care education: Not documented    Urine microalbumin/creatinine ratio: 38.5  (06/28/2008)   Urine microalbumin action/deferral: Ordered    Diabetes flowsheet reviewed?: Yes   Progress toward A1C goal: At goal  Lipids   Total Cholesterol: 93  (11/09/2008)   LDL: 30  (11/09/2008)   LDL Direct: Not documented   HDL: 36  (11/09/2008)   Triglycerides: 135  (11/09/2008)    SGOT (AST): 16  (11/09/2008)   BMP action: Ordered   SGPT (ALT): 8  (11/09/2008) CMP ordered    Alkaline phosphatase: 73  (11/09/2008)   Total bilirubin: 0.3  (11/09/2008)    Lipid flowsheet reviewed?: Yes   Progress toward LDL goal: At goal  Hypertension   Last Blood Pressure: 109 / 73  (09/27/2009)   Serum creatinine: 0.90  (11/09/2008)    BMP action: Ordered   Serum potassium 4.1  (11/09/2008) CMP ordered     Hypertension flowsheet reviewed?: Yes   Progress toward BP goal: At goal  Self-Management Support :   Personal Goals (by the next clinic visit) :     Personal A1C goal: 7  (03/14/2009)     Personal blood pressure goal: 130/80  (03/14/2009)     Personal LDL goal: 100  (03/14/2009)    Patient will work on the following items until the next clinic visit to reach self-care goals:     Medications and monitoring: take my medicines every day, check my blood sugar, bring all of my medications to every visit, examine my feet every day  (09/27/2009)     Eating: drink diet soda or water instead of juice or soda, eat more vegetables, use fresh or frozen vegetables, eat foods that are low in salt, eat baked foods instead of fried foods, eat fruit for snacks and desserts, limit or avoid alcohol  (09/27/2009)     Activity: take a 30 minute walk every day, take the stairs instead of the elevator, park at the far end of the parking lot  (09/27/2009)    Diabetes self-management support: Resources for patients handout, Written self-care plan  (09/27/2009)   Diabetes care plan printed   Last diabetes self-management training by diabetes educator: 11/09/2008   Last medical nutrition therapy: 06/28/2008    Hypertension self-management support: Resources for patients handout, Written self-care plan  (09/27/2009)   Hypertension self-care plan printed.    Lipid self-management support: Resources for patients handout,  Written self-care plan  (09/27/2009)   Lipid self-care plan printed.      Resource handout printed.   Nursing Instructions: Provide Hemoccult cards with instructions (see order) Refer for screening diabetic eye exam (see order)    Last LDL:                                                 30 (11/09/2008 9:30:00 PM)        Diabetic Foot Exam Foot Inspection Is there a history of a foot ulcer?              No Is  there a foot ulcer now?              No Can the patient see the bottom of their feet?          Yes Are the shoes appropriate in style and fit?          Yes Is there swelling or an abnormal foot shape?          No Are the toenails long?                No Are the toenails ingrown?              No Is there heavy callous build-up?              No Is there a claw toe deformity?                          No Is there elevated skin temperature?            No Is there limited ankle dorsiflexion?            No Is there foot or ankle muscle weakness?            No Do you have pain in calf while walking?           No         10-g (5.07) Semmes-Weinstein Monofilament Test Performed by: Theotis Barrio NT II          Right Foot          Left Foot Visual Inspection     normal           normal Test Control      normal         normal Site 1         normal         normal Site 2         normal         normal Site 3         normal         normal Site 4         normal         normal Site 5         normal         normal Site 6         normal         normal Site 7         normal         normal Site 8         normal  normal Site 9         normal         normal Site 10         normal         normal  Impression                normal

## 2010-07-23 NOTE — Progress Notes (Signed)
Summary: med refill/gp  Phone Note Refill Request Message from:  Fax from Pharmacy on June 28, 2009 1:48 PM  Refills Requested: Medication #1:  VICODIN ES 7.5-750 MG TABS Take 1 tablet by mouth four times a day as needed for pain   Dosage confirmed as above?Dosage Confirmed   Supply Requested: 1 month   Last Refilled: 05/28/2009  Method Requested: Telephone to Pharmacy Initial call taken by: Chinita Pester RN,  June 28, 2009 1:48 PM  Follow-up for Phone Call        Please schedule [name] in the next available non-overbook appointment with Dr. Threasa Beards. Follow-up by: Doneen Poisson MD,  June 28, 2009 3:07 PM  Additional Follow-up for Phone Call Additional follow up Details #1::        [facility] Flag sent to [facility] for an appt. Additional Follow-up by: Chinita Pester RN,  June 28, 2009 3:44 PM    Prescriptions: VICODIN ES 7.5-750 MG TABS (HYDROCODONE-ACETAMINOPHEN) Take 1 tablet by mouth four times a day as needed for pain  #120 x 0   Entered and Authorized by:   Doneen Poisson MD   Signed by:   Doneen Poisson MD on 06/28/2009   Method used:   [facility]ph[facility] to ...       [facility] [place] [phone]* (retail)       [phone] [place]  [address]       Ph: [phone] or [phone]       Fax: [phone]   RxID:   [id number]   Appended Document: med refill/gp Appt. scheduled with Dr. Threasa Beards 07/13/09.

## 2010-07-23 NOTE — Medication Information (Signed)
Summary: RX HISTORY REPORT  RX HISTORY REPORT   Imported By: Louretta Parma 03/01/2010 11:04:36  _____________________________________________________________________  External Attachment:    Type:   Image     Comment:   External Document

## 2010-07-23 NOTE — Progress Notes (Signed)
Summary: refill/ hla  Phone Note Refill Request Message from:  Patient on November 26, 2009 9:02 AM  Refills Requested: Medication #1:  VICODIN ES 7.5-750 MG TABS Take 1 tablet by mouth four times a day as needed for pain   Dosage confirmed as above?Dosage Confirmed   Last Refilled: 5/5 Initial call taken by: Marin Roberts RN,  November 26, 2009 9:02 AM  Follow-up for Phone Call        Rx called to pharmacy Follow-up by: Jackson Latino MD,  November 26, 2009 11:30 AM    Prescriptions: VICODIN ES 7.5-750 MG TABS (HYDROCODONE-ACETAMINOPHEN) Take 1 tablet by mouth four times a day as needed for pain  #120 x 0   Entered and Authorized by:   Jackson Latino MD   Signed by:   Jackson Latino MD on 11/26/2009   Method used:   [facility]ph[facility] ...       [facility] [place] [phone]* (retail)       [phone] [address] [place]  [address]       Ph: [phone] or [phone]       Fax: [phone]   RxID:   [id number]   Appended Document: refill/ hla Above [facility].

## 2010-07-23 NOTE — Progress Notes (Signed)
Summary: Refill/gh  Phone Note Refill Request Message from:  Fax from Pharmacy on January 25, 2010 4:39 PM  Refills Requested: Medication #1:  VICODIN ES 7.5-750 MG TABS Take 1 tablet by mouth four times a day as needed for pain   Last Refilled: 12/27/2009  Method Requested: Electronic Initial call taken by: Angelina Ok RN,  January 25, 2010 4:39 PM  Follow-up for Phone Call        Last filled7/7/11 for #120. Will refill. No narc contract on the summary page.  Follow-up by: Blanch Media MD,  January 25, 2010 5:05 PM  Additional Follow-up for Phone Call Additional follow up Details #1::        Rx called to pharmacy Additional Follow-up by: Merrie Roof RN,  January 28, 2010 9:05 AM    Prescriptions: VICODIN ES 7.5-750 MG TABS (HYDROCODONE-ACETAMINOPHEN) Take 1 tablet by mouth four times a day as needed for pain  #120 x 0   Entered and Authorized by:   Blanch Media MD   Signed by:   Blanch Media MD on 01/25/2010   Method used:   Telephoned to ...       [facility] [place] [phone]* (retail)       [phone] [address]       [place], [place]  [address]       Ph: [phone] or [phone]       Fax: [phone]   RxID:   [id number]

## 2010-07-23 NOTE — Progress Notes (Signed)
Summary: refill/ hla  Phone Note Refill Request Message from:  Patient on December 26, 2009 4:02 PM  Refills Requested: Medication #1:  VICODIN ES 7.5-750 MG TABS Take 1 tablet by mouth four times a day as needed for pain   Dosage confirmed as above?Dosage Confirmed   Last Refilled: 6/6 Initial call taken by: Marin Roberts RN,  December 26, 2009 4:02 PM  Follow-up for Phone Call        Rx called to pharmacy Follow-up by: Jackson Latino MD,  December 27, 2009 12:27 PM    Prescriptions: VICODIN ES 7.5-750 MG TABS (HYDROCODONE-ACETAMINOPHEN) Take 1 tablet by mouth four times a day as needed for pain  #120 x 0   Entered and Authorized by:   Jackson Latino MD   Signed by:   Jackson Latino MD on 12/27/2009   Method used:   [facility]phoned to ...       [facility] [place] [phone]* (retail)       [phone] [address]       [place], [place]  [address]       Ph: [phone] or [phone]       Fax: [phone]   RxID:   [id number]   Appended Document: refill/ hla Rx called to pharmacy, it had NOT been called in earlier

## 2010-07-25 ENCOUNTER — Telehealth: Payer: Self-pay | Admitting: *Deleted

## 2010-07-25 ENCOUNTER — Ambulatory Visit: Admit: 2010-07-25 | Payer: Self-pay

## 2010-07-25 ENCOUNTER — Ambulatory Visit (INDEPENDENT_AMBULATORY_CARE_PROVIDER_SITE_OTHER): Payer: Medicare Other | Admitting: Internal Medicine

## 2010-07-25 ENCOUNTER — Encounter: Payer: Self-pay | Admitting: Internal Medicine

## 2010-07-25 ENCOUNTER — Other Ambulatory Visit: Payer: Self-pay | Admitting: Internal Medicine

## 2010-07-25 VITALS — BP 135/75 | HR 96 | Temp 97.5°F | Ht 73.0 in | Wt 225.3 lb

## 2010-07-25 DIAGNOSIS — IMO0002 Reserved for concepts with insufficient information to code with codable children: Secondary | ICD-10-CM

## 2010-07-25 DIAGNOSIS — M5417 Radiculopathy, lumbosacral region: Secondary | ICD-10-CM | POA: Insufficient documentation

## 2010-07-25 LAB — GLUCOSE, CAPILLARY: Glucose-Capillary: 343 mg/dL — ABNORMAL HIGH (ref 70–99)

## 2010-07-25 MED ORDER — GABAPENTIN 300 MG PO CAPS: 300.0000 mg | ORAL_CAPSULE | Freq: Three times a day (TID) | ORAL | Status: DC

## 2010-07-25 NOTE — Progress Notes (Signed)
Summary: Refill/gh  Phone Note Refill Request Message from:  Fax from Pharmacy on June 24, 2010 1:51 PM  Refills Requested: Medication #1:  ADVAIR DISKUS 250-50 MCG/DOSE AEPB take 1 puff twice daily   Last Refilled: 04/26/2010  Method Requested: Electronic Initial call taken by: Angelina Ok RN,  June 24, 2010 1:51 PM    Prescriptions: ADVAIR DISKUS 250-50 MCG/DOSE AEPB (FLUTICASONE-SALMETEROL) take 1 puff twice daily  #1 x 3   Entered by:   Donia Guiles MD   Authorized by:   Ulyess Mort MD   Signed by:   Donia Guiles MD on 06/28/2010   Method used:   [facility]ron[facility] to        [facility] [phone]* (retail)       [address]       [place], [place]  [address]       Ph: [phone] or [phone]       Fax: [phone]   RxID:   [id number] ADVAIR DISKUS 250-50 MCG/DOSE AEPB (FLUTICASONE-SALMETEROL) take 1 puff twice daily  #1 x 3   Entered by:   Donia Guiles MD   Authorized by:   Ulyess Mort MD   Signed by:   Donia Guiles MD on 06/28/2010   Method used:   [facility] to        [facility] [phone]* (retail)       [address]       [place], [place]  [address]       Ph: [phone] or [phone]       Fax: [phone]   RxID:   [id number]

## 2010-07-25 NOTE — Miscellaneous (Signed)
  Clinical Lists Changes  Orders: Added new Referral order of Sports Medicine (Sports Med) - Signed

## 2010-07-25 NOTE — Progress Notes (Signed)
  Phone Note Outgoing Call   Call placed by: Jaci Lazier Summary of Call: Pt called and informed of MRI results. States left hip pain remains persistent and not well controlled on NSAIDs. He is also taking Vicodin for OA, which is also not controlling the pain. Will put in a referral for sports medicine, ?needs injections.

## 2010-07-25 NOTE — Progress Notes (Signed)
Summary: appt/ hla  Phone Note Call from Patient   Summary of Call: pt calls and states he is having the worst pain possible in his L leg x 2 weeks, nothing alleviates and just the least bit of movement causes him unbearable pain, states pain scale 10/10, desires appt asap. given for 1400, denies injuries, open wounds, swelling, bruising. Initial call taken by: Marin Roberts RN,  July 09, 2010 10:28 AM

## 2010-07-25 NOTE — Assessment & Plan Note (Signed)
Summary: Discuss pain med Threasa Beards)   Vital Signs:  Patient profile:   60 year old male Height:      73 inches Weight:      226.8 pounds BMI:     30.03 Temp:     99.1 degrees F oral Pulse rate:   82 / minute BP sitting:   113 / 73  (right arm)  Vitals Entered By: Filomena Jungling NT II (June 12, 2010 3:11 PM) CC: ? medications Is Patient Diabetic? Yes Pain Assessment Patient in pain? yes     Location: all over Intensity: 8 Type: aching Onset of pain  Chronic Nutritional Status BMI of > 30 = obese CBG Result 121  Have you ever been in a relationship where you felt threatened, hurt or afraid?No   Does patient need assistance? Functional Status Self care Ambulation Normal   Primary Care Noris Kulinski:  Jackson Latino MD  CC:  ? medications.  History of Present Illness: Pt is a 60 yo AAM with PMH of DM type 2, HTN, HLD and OA came here for pain in his both sides of knee and hip. He used to take vicodin to control the pain and we have changed to tramadol which is not helpful to his pain and wants to restart vicodin. He has no other c/o, including CP, SOB, or fever.  His CBG runs 120s.   Preventive Screening-Counseling & Management  Alcohol-Tobacco     Smoking Status: quit     Smoking Cessation Counseling: yes     Packs/Day: 1/2     Year Started: 1962     Year Quit: 8/ 2008  Caffeine-Diet-Exercise     Does Patient Exercise: yes     Type of exercise: WALKING     Times/week: 7  Problems Prior to Update: 1)  Preventive Health Care  (ICD-V70.0) 2)  Lipoma, Skin  (ICD-214.1) 3)  Hyperlipidemia  (ICD-272.4) 4)  Diabetes Mellitus, Type II  (ICD-250.00) 5)  Psvt  (ICD-427.0) 6)  Essential Hypertension, Benign  (ICD-401.1) 7)  Hx, Personal, Tobacco Use  (ICD-V15.82) 8)  Preventive Health Care  (ICD-V70.0) 9)  COPD  (ICD-496) 10)  Erectile Dysfunction  (ICD-302.72) 11)  Emphysema  (ICD-492.8) 12)  Osteoarthritis  (ICD-715.90) 13)  Baker's Cyst, Left Knee   (ICD-727.51)  Medications Prior to Update: 1)  Spiriva Handihaler 18 Mcg Caps (Tiotropium Bromide Monohydrate) .... Inhale The Contents of 1 Capsule Once Daily 2)  Advair Diskus 250-50 Mcg/dose Aepb (Fluticasone-Salmeterol) .... Take 1 Puff Twice Daily 3)  Albuterol 90 Mcg/act Aers (Albuterol) .... Inhale 2 Puffs Four Times A Day As Needed 4)  Arthrotec 50 50-200 Mg-Mcg Tabs (Diclofenac-Misoprostol) .... Take 1 Tablet By Mouth Two Times A Day 5)  Viagra 25 Mg Tabs (Sildenafil Citrate) .... Use As Directed 6)  Cardizem Cd 240 Mg Xr24h-Cap (Diltiazem Hcl Coated Beads) .... Take 1 Tablet By Mouth Once A Day 7)  Zocor 40 Mg Tabs (Simvastatin) .... Take 1 Tablet By Mouth Once A Day 8)  Anacin 81 Mg Tbec (Aspirin) .... Take 1 Tablet By Mouth Once A Day 9)  Glipizide 5 Mg Tabs (Glipizide) .... Take 1 Tablet By Mouth Once A Day 10)  Metformin Hcl 500 Mg Tabs (Metformin Hcl) .... Take 1 Tablet By Mouth Two Times A Day 11)  Lisinopril 2.5 Mg Tabs (Lisinopril) .... Take 1 Tablet By Mouth Once A Day 12)  Tramadol Hcl 100 Mg Xr24h-Tab (Tramadol Hcl) .... Take 2 Tablets By Mouth Once A Day  Current Medications (  verified): 1)  Spiriva Handihaler 18 Mcg Caps (Tiotropium Bromide Monohydrate) .... Inhale The Contents of 1 Capsule Once Daily 2)  Advair Diskus 250-50 Mcg/dose Aepb (Fluticasone-Salmeterol) .... Take 1 Puff Twice Daily 3)  Albuterol 90 Mcg/act Aers (Albuterol) .... Inhale 2 Puffs Four Times A Day As Needed 4)  Arthrotec 50 50-200 Mg-Mcg Tabs (Diclofenac-Misoprostol) .... Take 1 Tablet By Mouth Two Times A Day 5)  Viagra 25 Mg Tabs (Sildenafil Citrate) .... Use As Directed 6)  Cardizem Cd 240 Mg Xr24h-Cap (Diltiazem Hcl Coated Beads) .... Take 1 Tablet By Mouth Once A Day 7)  Zocor 40 Mg Tabs (Simvastatin) .... Take 1 Tablet By Mouth Once A Day 8)  Anacin 81 Mg Tbec (Aspirin) .... Take 1 Tablet By Mouth Once A Day 9)  Glipizide 5 Mg Tabs (Glipizide) .... Take 1 Tablet By Mouth Once A Day 10)   Metformin Hcl 500 Mg Tabs (Metformin Hcl) .... Take 1 Tablet By Mouth Two Times A Day 11)  Lisinopril 2.5 Mg Tabs (Lisinopril) .... Take 1 Tablet By Mouth Once A Day 12)  Tramadol Hcl 100 Mg Xr24h-Tab (Tramadol Hcl) .... Take 2 Tablets By Mouth Once A Day  Allergies (verified): No Known Drug Allergies  Past History:  Past Medical History: Last updated: 11/12/2007 Osteoarthritis Severe Bilateral OA of knees History of Tobacco abuse Erectile dysfunction Chronic Knee Pain since 2004- has been to ortho clinic, completed PT , refused steroid injection- will probably knee TKR in future. Has seen otrthopedic for knee replacement. COPD Baker's cyst, left knee- ruptured. Hx SVT, requiring adenosine in 2006  Family History: Last updated: 07/08/2007 Throat CA Mother Family History Liver disease ( Cirrhosis) Father  Social History: Last updated: 11/12/2007 Former Smoker  Risk Factors: Smoking Status: quit (06/12/2010) Packs/Day: 1/2 (06/12/2010)  Family History: Reviewed history from 07/08/2007 and no changes required. Throat CA Mother Family History Liver disease ( Cirrhosis) Father  Social History: Reviewed history from 11/12/2007 and no changes required. Former Smoker  Review of Systems  The patient denies fever, weight loss, weight gain, chest pain, syncope, dyspnea on exertion, peripheral edema, prolonged cough, headaches, hemoptysis, abdominal pain, and melena.    Physical Exam  General:  alert, well-developed, well-nourished, and well-hydrated.   Nose:  no nasal discharge.   Mouth:  pharynx pink and moist.   Neck:  supple.   Lungs:  normal respiratory effort, normal breath sounds, no crackles, and no wheezes.   Heart:  normal rate, regular rhythm, no murmur, and no JVD.   Abdomen:  soft, non-tender, normal bowel sounds, and no distention.   Msk:  normal ROM, no joint swelling, and no joint warmth. Both knee and hip mild tenderness.   Extremities:  No edema.   Neurologic:  alert & oriented X3 and gait normal.     Impression & Recommendations:  Problem # 1:  OSTEOARTHRITIS (ICD-715.90) Assessment Deteriorated The pain is not well controlled with tramadol. Will change back to vicodin as well as exercise and heating/icy pad.  The following medications were removed from the medication list:    Tramadol Hcl 100 Mg Xr24h-tab (Tramadol hcl) .Marland Kitchen... Take 2 tablets by mouth once a day His updated medication list for this problem includes:    Arthrotec 50 50-200 Mg-mcg Tabs (Diclofenac-misoprostol) .Marland Kitchen... Take 1 tablet by mouth two times a day    Anacin 81 Mg Tbec (Aspirin) .Marland Kitchen... Take 1 tablet by mouth once a day    Vicodin Es 7.5-750 Mg Tabs (Hydrocodone-acetaminophen) .Marland Kitchen... Take 1 tablet  by mouth three times a day as needed  Problem # 2:  DIABETES MELLITUS, TYPE II (ICD-250.00) Assessment: Unchanged  CBG 120s and A1Cat goal. Will continue current meds.  His updated medication list for this problem includes:    Anacin 81 Mg Tbec (Aspirin) .Marland Kitchen... Take 1 tablet by mouth once a day    Glipizide 5 Mg Tabs (Glipizide) .Marland Kitchen... Take 1 tablet by mouth once a day    Metformin Hcl 500 Mg Tabs (Metformin hcl) .Marland Kitchen... Take 1 tablet by mouth two times a day    Lisinopril 2.5 Mg Tabs (Lisinopril) .Marland Kitchen... Take 1 tablet by mouth once a day  Labs Reviewed: Creat: 0.97 (05/15/2010)     Last Eye Exam: Results: No Diabetic retinopathy, early Hypertensive changes.  (09/27/2008) Reviewed HgBA1c results: 6.6 (05/15/2010)  6.1 (08/15/2009)  Problem # 3:  ESSENTIAL HYPERTENSION, BENIGN (ICD-401.1) Assessment: Improved BP at goal and continue current meds.  His updated medication list for this problem includes:    Cardizem Cd 240 Mg Xr24h-cap (Diltiazem hcl coated beads) .Marland Kitchen... Take 1 tablet by mouth once a day    Lisinopril 2.5 Mg Tabs (Lisinopril) .Marland Kitchen... Take 1 tablet by mouth once a day  BP today: 113/73 Prior BP: 130/80 (05/15/2010)  Labs Reviewed: K+: 3.9  (05/15/2010) Creat: : 0.97 (05/15/2010)   Chol: 93 (11/09/2008)   HDL: 36 (11/09/2008)   LDL: 30 (11/09/2008)   TG: 135 (11/09/2008)  Complete Medication List: 1)  Spiriva Handihaler 18 Mcg Caps (Tiotropium bromide monohydrate) .... Inhale the contents of 1 capsule once daily 2)  Advair Diskus 250-50 Mcg/dose Aepb (Fluticasone-salmeterol) .... Take 1 puff twice daily 3)  Albuterol 90 Mcg/act Aers (Albuterol) .... Inhale 2 puffs four times a day as needed 4)  Arthrotec 50 50-200 Mg-mcg Tabs (Diclofenac-misoprostol) .... Take 1 tablet by mouth two times a day 5)  Viagra 25 Mg Tabs (Sildenafil citrate) .... Use as directed 6)  Cardizem Cd 240 Mg Xr24h-cap (Diltiazem hcl coated beads) .... Take 1 tablet by mouth once a day 7)  Zocor 40 Mg Tabs (Simvastatin) .... Take 1 tablet by mouth once a day 8)  Anacin 81 Mg Tbec (Aspirin) .... Take 1 tablet by mouth once a day 9)  Glipizide 5 Mg Tabs (Glipizide) .... Take 1 tablet by mouth once a day 10)  Metformin Hcl 500 Mg Tabs (Metformin hcl) .... Take 1 tablet by mouth two times a day 11)  Lisinopril 2.5 Mg Tabs (Lisinopril) .... Take 1 tablet by mouth once a day 12)  Vicodin Es 7.5-750 Mg Tabs (Hydrocodone-acetaminophen) .... Take 1 tablet by mouth three times a day as needed  Patient Instructions: 1)  Please schedule a follow-up appointment in 3 months. 2)  It is important that you exercise regularly at least 20 minutes 5 times a week. If you develop chest pain, have severe difficulty breathing, or feel very tired , stop exercising immediately and seek medical attention. 3)  You need to lose weight. Consider a lower calorie diet and regular exercise.  Prescriptions: VICODIN ES 7.5-750 MG TABS (HYDROCODONE-ACETAMINOPHEN) Take 1 tablet by mouth three times a day as needed  #120 x 1   Entered and Authorized by:   Jackson Latino MD   Signed by:   Jackson Latino MD on 06/12/2010   Method used:   Print then Give to Patient   RxID:    [id number]    Orders Added: 1)  Est. Patient Level III [41324]    Prevention & Chronic Care Immunizations  Influenza vaccine: Fluvax MCR  (05/09/2010)    Tetanus booster: Not documented    Pneumococcal vaccine: Not documented    H. zoster vaccine: Not documented  Colorectal Screening   Hemoccult: Not documented   Hemoccult action/deferral: Ordered  (05/15/2010)    Colonoscopy: Results:61mm sessile  Polyp.  Results: Diverticulosis & int. & ext. hemmoroids. Results: Specimen sent for pathology.      (12/28/2006)   Colonoscopy due: 12/2011  Other Screening   PSA: 0.23  (09/27/2009)   PSA action/deferral: Discussed-PSA requested  (09/27/2009)   Smoking status: quit  (06/12/2010)  Diabetes Mellitus   HgbA1C: 6.6  (05/15/2010)   HgbA1C action/deferral: Ordered  (03/14/2009)    Eye exam: Results: No Diabetic retinopathy, early Hypertensive changes.   (09/27/2008)   Diabetic eye exam action/deferral: Ophthalmology referral  (09/27/2009)   Eye exam due: 09/2009    Foot exam: yes  (05/15/2010)   Foot exam action/deferral: Do today   High risk foot: No  (05/15/2010)   Foot care education: Done  (05/15/2010)   Foot exam due: 05/16/2011    Urine microalbumin/creatinine ratio: 39.8  (09/27/2009)   Urine microalbumin action/deferral: Ordered    Diabetes flowsheet reviewed?: Yes   Progress toward A1C goal: At goal  Lipids   Total Cholesterol: 93  (11/09/2008)   LDL: 30  (11/09/2008)   LDL Direct: Not documented   HDL: 36  (11/09/2008)   Triglycerides: 135  (11/09/2008)    SGOT (AST): 12  (05/15/2010)   BMP action: Ordered   SGPT (ALT): 9  (05/15/2010)   Alkaline phosphatase: 85  (05/15/2010)   Total bilirubin: 0.5  (05/15/2010)    Lipid flowsheet reviewed?: Yes   Progress toward LDL goal: At goal  Hypertension   Last Blood Pressure: 113 / 73  (06/12/2010)   Serum creatinine: 0.97  (05/15/2010)   BMP action: Ordered   Serum potassium 3.9   (05/15/2010)    Hypertension flowsheet reviewed?: Yes   Progress toward BP goal: At goal  Self-Management Support :   Personal Goals (by the next clinic visit) :     Personal A1C goal: 7  (03/14/2009)     Personal blood pressure goal: 130/80  (03/14/2009)     Personal LDL goal: 100  (03/14/2009)    Patient will work on the following items until the next clinic visit to reach self-care goals:     Medications and monitoring: take my medicines every day, check my blood sugar, examine my feet every day  (06/12/2010)     Eating: drink diet soda or water instead of juice or soda, eat more vegetables, use fresh or frozen vegetables, eat foods that are low in salt, eat baked foods instead of fried foods, eat fruit for snacks and desserts  (06/12/2010)     Activity: take a 30 minute walk every day, take the stairs instead of the elevator, park at the far end of the parking lot  (06/12/2010)    Diabetes self-management support: Resources for patients handout, Written self-care plan  (09/27/2009)   Last diabetes self-management training by diabetes educator: 11/09/2008   Last medical nutrition therapy: 06/28/2008    Hypertension self-management support: Resources for patients handout, Written self-care plan  (09/27/2009)    Lipid self-management support: Resources for patients handout, Written self-care plan  (09/27/2009)

## 2010-07-25 NOTE — Patient Instructions (Signed)
Please return in one month for follow up. Make a morning appointment in one month. Return fasting to check Lipid panel. Remember to start taking Neurontin for leg pain as directed.

## 2010-07-25 NOTE — Progress Notes (Signed)
Summary: Refill/gh  Phone Note Refill Request Message from:  Patient on June 10, 2010 12:04 PM  Refills Requested: Medication #1:  TRAMADOL HCL 100 MG XR24H-TAB Take 1 tablet by mouth once a day. Pt called said that the Tramadol is not vorking as well would like to go back on the Vicodin.   Method Requested: Electronic Initial call taken by: Angelina Ok RN,  June 10, 2010 12:05 PM  Follow-up for Phone Call        Will increase tramadol to 2 tabs daily and use heating/icy pad. Will re-evaluate in 2-3 weeks. Follow-up by: Jackson Latino MD,  June 10, 2010 12:26 PM    New/Updated Medications: TRAMADOL HCL 100 MG XR24H-TAB (TRAMADOL HCL) Take 2 tablets by mouth once a day  Appended Document: Refill/gh Pt was called said that he is taking 2 Tramadol a day and it is not helping.  Using the heat.  Can hardly get up.  Wants to get something for the pain. Angelina Ok, RN June 10, 2010.

## 2010-07-25 NOTE — Assessment & Plan Note (Signed)
Summary: NP,L HIP PAIN,MC   Vital Signs:  Patient profile:   60 year old male BP sitting:   147 / 91  Vitals Entered By: Lillia Pauls CMA (July 19, 2010 10:35 AM)  Primary Care Provider:  Jackson Latino MD   History of Present Illness: 1) left hip pain worsening over last months. Uses a cane. Was able to do some exercise 9at [employer]) until about 2 m ago. Stiffness. Aching pain.    2) also left lower leg pain--lateral--with walking. TTP and sore. worse with walking.  Current Medications (verified): 1)  Spiriva Handihaler 18 Mcg Caps (Tiotropium Bromide Monohydrate) .... Inhale The Contents of 1 Capsule Once Daily 2)  Advair Diskus 250-50 Mcg/dose Aepb (Fluticasone-Salmeterol) .... Take 1 Puff Twice Daily 3)  Albuterol 90 Mcg/act Aers (Albuterol) .... Inhale 2 Puffs Four Times A Day As Needed 4)  Arthrotec 50 50-200 Mg-Mcg Tabs (Diclofenac-Misoprostol) .... Take 1 Tablet By Mouth Two Times A Day 5)  Viagra 25 Mg Tabs (Sildenafil Citrate) .... Use As Directed 6)  Cardizem Cd 240 Mg Xr24h-Cap (Diltiazem Hcl Coated Beads) .... Take 1 Tablet By Mouth Once A Day 7)  Zocor 40 Mg Tabs (Simvastatin) .... Take 1 Tablet By Mouth Once A Day 8)  Anacin 81 Mg Tbec (Aspirin) .... Take 1 Tablet By Mouth Once A Day 9)  Glipizide 5 Mg Tabs (Glipizide) .... Take 1 Tablet By Mouth Once A Day 10)  Metformin Hcl 500 Mg Tabs (Metformin Hcl) .... Take 1 Tablet By Mouth Two Times A Day 11)  Lisinopril 2.5 Mg Tabs (Lisinopril) .... Take 1 Tablet By Mouth Once A Day 12)  Vicodin Es 7.5-750 Mg Tabs (Hydrocodone-Acetaminophen) .... Take 1 Tablet By Mouth Three Times A Day As Needed 13)  Ibuprofen 800 Mg Tabs (Ibuprofen) .Marland Kitchen.. 1 Tablet By Mouth Every 8 Hours As Needed For Pain. Take With Meals.  Allergies: No Known Drug Allergies  Review of Systems  The patient denies fever, weight loss, and weight gain.    Physical Exam  General:  alert, well-developed, well-nourished, and well-hydrated.   Msk:   left hip ROM is similar to right, a little decrease ER but symmetrical. TTP greater trochanteric bursa.   left lower leg ttp lateral calf. no defect. normal dorsiflexion and plantar flexion.   GAIT: walks with a cane on left sde--very antalgic, slowed. Short stride length left. Additional Exam:  Patient given informed consent for injection. Discussed possible complications of infection, bleeding or skin atrophy at site of injection. Possible side effect of avascular necrosis (focal area of bone death) due to steroid use.Appropriate verbal time out taken Are cleaned and prepped in usual sterile fashion. A ---1- cc kennalog plus --4--cc 1% lidocaine without epinephrine was injected into the---left greater trochanteric bursa. Patient tolerated procedure well with no complications.    Impression & Recommendations:  Problem # 1:  HIP PAIN, LEFT (ICD-719.45)  Orders: Joint Aspirate / Injection, Large (16109) Kenalog 10 mg inj (J3301) multifactorial pain--a lot of OA many joints. I think the recent decline is due to bursitis--we tried injection  today. I would like to see him get back to the gym.  Problem # 2:  LEG PAIN, LEFT (ICD-729.5) I think calf pain iis from antalgic gait and compensatory issues. Not sure we can do much about that except treat the hip as above. Could consider PT for gait training. he will f/u w PCP.  Complete Medication List: 1)  Spiriva Handihaler 18 Mcg Caps (Tiotropium bromide monohydrate) .Marland KitchenMarland KitchenMarland Kitchen  Inhale the contents of 1 capsule once daily 2)  Advair Diskus 250-50 Mcg/dose Aepb (Fluticasone-salmeterol) .... Take 1 puff twice daily 3)  Albuterol 90 Mcg/act Aers (Albuterol) .... Inhale 2 puffs four times a day as needed 4)  Arthrotec 50 50-200 Mg-mcg Tabs (Diclofenac-misoprostol) .... Take 1 tablet by mouth two times a day 5)  Viagra 25 Mg Tabs (Sildenafil citrate) .... Use as directed 6)  Cardizem Cd 240 Mg Xr24h-cap (Diltiazem hcl coated beads) .... Take 1 tablet by mouth  once a day 7)  Zocor 40 Mg Tabs (Simvastatin) .... Take 1 tablet by mouth once a day 8)  Anacin 81 Mg Tbec (Aspirin) .... Take 1 tablet by mouth once a day 9)  Glipizide 5 Mg Tabs (Glipizide) .... Take 1 tablet by mouth once a day 10)  Metformin Hcl 500 Mg Tabs (Metformin hcl) .... Take 1 tablet by mouth two times a day 11)  Lisinopril 2.5 Mg Tabs (Lisinopril) .... Take 1 tablet by mouth once a day 12)  Vicodin Es 7.5-750 Mg Tabs (Hydrocodone-acetaminophen) .... Take 1 tablet by mouth three times a day as needed 13)  Ibuprofen 800 Mg Tabs (Ibuprofen) .Marland Kitchen.. 1 tablet by mouth every 8 hours as needed for pain. take with meals.   Orders Added: 1)  Est. Patient Level IV [19147] 2)  Joint Aspirate / Injection, Large [20610] 3)  Kenalog 10 mg inj [J3301]

## 2010-07-25 NOTE — Assessment & Plan Note (Signed)
Summary: L leg pain x 2 wks, 10/10 pain scale/pcp-yang/hla   Vital Signs:  Patient profile:   60 year old male Height:      73 inches Weight:      228.0 pounds BMI:     30.19 Temp:     98.7 degrees F oral Pulse rate:   80 / minute BP sitting:   118 / 68  (right arm)  Vitals Entered By: Filomena Jungling NT II (July 09, 2010 1:59 PM) CC: 2 weeks of pain left hip an side down to foot Is Patient Diabetic? Yes Did you bring your meter with you today? Yes Nutritional Status BMI of > 30 = obese CBG Result 153  Have you ever been in a relationship where you felt threatened, hurt or afraid?No   Does patient need assistance? Functional Status Self care Ambulation Normal   Primary Care Provider:  Jackson Latino MD  CC:  2 weeks of pain left hip an side down to foot.  History of Present Illness: Pt is a 60 y/o male with h/o bilateral knee osteoarthritis and DM2 presenting with a 2 week h/o severe left hip pain. Patient cannot recall any triggering event, but describes the pain as a sharp, nagging pain that starts in his left hip and shoots down his left leg to the left foot. There is some occassional numbness as well. He denies any history of trauma, bladder/bowel incontinence, recent fever/chills/nightsweats, back pain or recent weight loss. He states that walking and taking a shower improves the pain. He does take Vicodin for his knee osteoarthiritis and this does not help much. He states he had a similar experience on his right hip about 12 years ago.  Preventive Screening-Counseling & Management  Alcohol-Tobacco     Smoking Status: quit     Smoking Cessation Counseling: yes     Packs/Day: 1/2     Year Started: 1962     Year Quit: 8/ 2008  Caffeine-Diet-Exercise     Does Patient Exercise: yes     Type of exercise: WALKING     Times/week: 7  Current Problems (verified): 1)  Hip Pain, Left  (ICD-719.45) 2)  Preventive Health Care  (ICD-V70.0) 3)  Lipoma, Skin  (ICD-214.1) 4)   Hyperlipidemia  (ICD-272.4) 5)  Diabetes Mellitus, Type II  (ICD-250.00) 6)  Psvt  (ICD-427.0) 7)  Essential Hypertension, Benign  (ICD-401.1) 8)  Hx, Personal, Tobacco Use  (ICD-V15.82) 9)  Preventive Health Care  (ICD-V70.0) 10)  COPD  (ICD-496) 11)  Erectile Dysfunction  (ICD-302.72) 12)  Emphysema  (ICD-492.8) 13)  Osteoarthritis  (ICD-715.90) 14)  Baker's Cyst, Left Knee  (ICD-727.51)  Current Medications (verified): 1)  Spiriva Handihaler 18 Mcg Caps (Tiotropium Bromide Monohydrate) .... Inhale The Contents of 1 Capsule Once Daily 2)  Advair Diskus 250-50 Mcg/dose Aepb (Fluticasone-Salmeterol) .... Take 1 Puff Twice Daily 3)  Albuterol 90 Mcg/act Aers (Albuterol) .... Inhale 2 Puffs Four Times A Day As Needed 4)  Arthrotec 50 50-200 Mg-Mcg Tabs (Diclofenac-Misoprostol) .... Take 1 Tablet By Mouth Two Times A Day 5)  Viagra 25 Mg Tabs (Sildenafil Citrate) .... Use As Directed 6)  Cardizem Cd 240 Mg Xr24h-Cap (Diltiazem Hcl Coated Beads) .... Take 1 Tablet By Mouth Once A Day 7)  Zocor 40 Mg Tabs (Simvastatin) .... Take 1 Tablet By Mouth Once A Day 8)  Anacin 81 Mg Tbec (Aspirin) .... Take 1 Tablet By Mouth Once A Day 9)  Glipizide 5 Mg Tabs (Glipizide) .... Take 1 Tablet  By Mouth Once A Day 10)  Metformin Hcl 500 Mg Tabs (Metformin Hcl) .... Take 1 Tablet By Mouth Two Times A Day 11)  Lisinopril 2.5 Mg Tabs (Lisinopril) .... Take 1 Tablet By Mouth Once A Day 12)  Vicodin Es 7.5-750 Mg Tabs (Hydrocodone-Acetaminophen) .... Take 1 Tablet By Mouth Three Times A Day As Needed 13)  Ibuprofen 800 Mg Tabs (Ibuprofen) .Marland Kitchen.. 1 Tablet By Mouth Every 8 Hours As Needed For Pain. Take With Meals.  Allergies (verified): No Known Drug Allergies  Past History:  Past Medical History: Last updated: 11/12/2007 Osteoarthritis Severe Bilateral OA of knees History of Tobacco abuse Erectile dysfunction Chronic Knee Pain since 2004- has been to ortho clinic, completed PT , refused steroid  injection- will probably knee TKR in future. Has seen otrthopedic for knee replacement. COPD Baker's cyst, left knee- ruptured. Hx SVT, requiring adenosine in 2006  Family History: Last updated: 07/08/2007 Throat CA Mother Family History Liver disease ( Cirrhosis) Father  Social History: Last updated: 11/12/2007 Former Smoker  Risk Factors: Exercise: yes (07/09/2010)  Risk Factors: Smoking Status: quit (07/09/2010) Packs/Day: 1/2 (07/09/2010)  Review of Systems      See HPI  Physical Exam  General:  Vital signs reviewed and noted. Well-developed,well-nourished,in no acute distress; alert,appropriate and cooperative throughout examination. Head: normocephalic, atraumatic. Neck: No deformities, masses, or tenderness noted. Lungs: Normal respiratory effort. Clear to auscultation BL without crackles or wheezes.  Heart: RRR. S1 and S2 normal without gallop, murmur, or rubs.  Abdomen: BS normoactive. Soft, Nondistended, non-tender.  No masses or organomegaly. Extremities: No pretibial edema. MSK: left hip: moderate to severe tenderness on palpation, positive straight leg raise, limited ROM at left hip joint. No spinal or paraspinal muscle tenderness. Neurologic:  strength normal in all extremities and DTRs symmetrical and normal. Gait unsteady 2/2 left hip pain.   Impression & Recommendations:  Problem # 1:  HIP PAIN, LEFT (ICD-719.45) Concerning for lumbosacral radiculopathy, likely disc herniation>> nerve root compression from degenerative changes. Will get an MRI to further assess. Unlikely cauda equina/ spinal stenosis, especially in the absence of loss of bowel/bladder function or marked neurologic disability. Antiinflammatories for symptomatic pain control in the mean time. Follow MRI and plan accordingly.  His updated medication list for this problem includes:    Arthrotec 50 50-200 Mg-mcg Tabs (Diclofenac-misoprostol) .Marland Kitchen... Take 1 tablet by mouth two times a day     Anacin 81 Mg Tbec (Aspirin) .Marland Kitchen... Take 1 tablet by mouth once a day    Vicodin Es 7.5-750 Mg Tabs (Hydrocodone-acetaminophen) .Marland Kitchen... Take 1 tablet by mouth three times a day as needed    Ibuprofen 800 Mg Tabs (Ibuprofen) .Marland Kitchen... 1 tablet by mouth every 8 hours as needed for pain. take with meals.  Orders: MRI (MRI)  Complete Medication List: 1)  Spiriva Handihaler 18 Mcg Caps (Tiotropium bromide monohydrate) .... Inhale the contents of 1 capsule once daily 2)  Advair Diskus 250-50 Mcg/dose Aepb (Fluticasone-salmeterol) .... Take 1 puff twice daily 3)  Albuterol 90 Mcg/act Aers (Albuterol) .... Inhale 2 puffs four times a day as needed 4)  Arthrotec 50 50-200 Mg-mcg Tabs (Diclofenac-misoprostol) .... Take 1 tablet by mouth two times a day 5)  Viagra 25 Mg Tabs (Sildenafil citrate) .... Use as directed 6)  Cardizem Cd 240 Mg Xr24h-cap (Diltiazem hcl coated beads) .... Take 1 tablet by mouth once a day 7)  Zocor 40 Mg Tabs (Simvastatin) .... Take 1 tablet by mouth once a day 8)  Anacin 81  Mg Tbec (Aspirin) .... Take 1 tablet by mouth once a day 9)  Glipizide 5 Mg Tabs (Glipizide) .... Take 1 tablet by mouth once a day 10)  Metformin Hcl 500 Mg Tabs (Metformin hcl) .... Take 1 tablet by mouth two times a day 11)  Lisinopril 2.5 Mg Tabs (Lisinopril) .... Take 1 tablet by mouth once a day 12)  Vicodin Es 7.5-750 Mg Tabs (Hydrocodone-acetaminophen) .... Take 1 tablet by mouth three times a day as needed 13)  Ibuprofen 800 Mg Tabs (Ibuprofen) .Marland Kitchen.. 1 tablet by mouth every 8 hours as needed for pain. take with meals.  Other Orders: T- Capillary Blood Glucose (82948) T-Hgb A1C (in-house) (29528UX)  Patient Instructions: 1)  You can use a heat pad for your hip pain.  2)  We will let you know of your MRI results and we can plan further treatment depending on your results. 3)  Take Ibuprofen, up to 800mg  three times a day with meals in the mean time. 4)  Pls call the clinic if you have any questions or  concerns. 5)  Please schedule a follow-up appointment in 2 weeks. Prescriptions: IBUPROFEN 800 MG TABS (IBUPROFEN) 1 tablet by mouth every 8 hours as needed for pain. Take with meals.  #30 x 0   Entered and Authorized by:   Jaci Lazier MD   Signed by:   Jaci Lazier MD on 07/09/2010   Method used:   Print then Give to Patient   RxID:   [id number]    Orders Added: 1)  T- Capillary Blood Glucose [82948] 2)  T-Hgb A1C (in-house) [03474QV] 3)  MRI [MRI]    Prevention & Chronic Care Immunizations   Influenza vaccine: Fluvax MCR  (05/09/2010)    Tetanus booster: Not documented    Pneumococcal vaccine: Not documented    H. zoster vaccine: Not documented  Colorectal Screening   Hemoccult: Not documented   Hemoccult action/deferral: Ordered  (05/15/2010)    Colonoscopy: Results:67mm sessile  Polyp.  Results: Diverticulosis & int. & ext. hemmoroids. Results: Specimen sent for pathology.      (12/28/2006)   Colonoscopy due: 12/2011  Other Screening   PSA: 0.23  (09/27/2009)   PSA action/deferral: Discussed-PSA requested  (09/27/2009)   Smoking status: quit  (07/09/2010)  Diabetes Mellitus   HgbA1C: 6.3  (07/09/2010)   HgbA1C action/deferral: Ordered  (03/14/2009)    Eye exam: Results: No Diabetic retinopathy, early Hypertensive changes.   (09/27/2008)   Diabetic eye exam action/deferral: Ophthalmology referral  (09/27/2009)   Eye exam due: 09/2009    Foot exam: yes  (05/15/2010)   Foot exam action/deferral: Do today   High risk foot: No  (05/15/2010)   Foot care education: Done  (05/15/2010)   Foot exam due: 05/16/2011    Urine microalbumin/creatinine ratio: 39.8  (09/27/2009)   Urine microalbumin action/deferral: Ordered  Lipids   Total Cholesterol: 93  (11/09/2008)   LDL: 30  (11/09/2008)   LDL Direct: Not documented   HDL: 36  (11/09/2008)   Triglycerides: 135  (11/09/2008)    SGOT (AST): 12  (05/15/2010)   BMP action: Ordered   SGPT (ALT): 9   (05/15/2010)   Alkaline phosphatase: 85  (05/15/2010)   Total bilirubin: 0.5  (05/15/2010)  Hypertension   Last Blood Pressure: 118 / 68  (07/09/2010)   Serum creatinine: 0.97  (05/15/2010)   BMP action: Ordered   Serum potassium 3.9  (05/15/2010)  Self-Management Support :   Personal Goals (by the next clinic visit) :  Personal A1C goal: 7  (03/14/2009)     Personal blood pressure goal: 130/80  (03/14/2009)     Personal LDL goal: 100  (03/14/2009)    Patient will work on the following items until the next clinic visit to reach self-care goals:     Medications and monitoring: take my medicines every day, examine my feet every day  (07/09/2010)     Eating: eat more vegetables, use fresh or frozen vegetables, eat foods that are low in salt, eat baked foods instead of fried foods, eat fruit for snacks and desserts  (07/09/2010)     Activity: take a 30 minute walk every day, take the stairs instead of the elevator, park at the far end of the parking lot  (07/09/2010)    Diabetes self-management support: Resources for patients handout, Written self-care plan  (09/27/2009)   Last diabetes self-management training by diabetes educator: 11/09/2008   Last medical nutrition therapy: 06/28/2008    Hypertension self-management support: Education handout  (07/09/2010)   Hypertension education handout printed    Lipid self-management support: Education handout  (07/09/2010)     Lipid education handout printed     Laboratory Results   Blood Tests   Date/Time Received: July 09, 2010 2:07 PM  Date/Time Reported: Burke Keels  July 09, 2010 2:07 PM   HGBA1C: 6.3%   (Normal Range: Non-Diabetic - 3-6%   Control Diabetic - 6-8%) CBG Random:: 153mg /dL     Appended Document: L leg pain x 2 wks, 10/10 pain scale/pcp-yang/hla    Clinical Lists Changes  Orders: Added new Service order of Est. Patient Level III (64332) - Signed

## 2010-07-25 NOTE — Progress Notes (Signed)
Summary: Pain medication  Phone Note Call from Patient   Caller: Patient Call For: Jackson Latino MD Summary of Call: Call from pt said that the pain med that he is on does not work.  wants something else for pain.  Pt said that he has done the options that were given to him yesterday.  Said that he needs more and that the dosage he is on is for a baby.  Pt was given an appointment to come in on tomorrow to discuss his pain medicine.  Angelina Ok RN  June 11, 2010 10:58 AM  Initial call taken by: Angelina Ok RN,  June 11, 2010 10:58 AM  Follow-up for Phone Call        Agree and I will discuss with him.   Thanks Follow-up by: Jackson Latino MD,  June 12, 2010 12:13 PM

## 2010-07-25 NOTE — Progress Notes (Signed)
  Subjective:    Patient ID: [name], male    DOB: 04/25/1951, 60 y.o.   MRN: [id number]  Hip Pain     60 year old male with history of bilateral knee osteoarthritis and diabetes type 2 presents comes into the clinic today for follow up of left hip pain. Patient reports to have seen Dr. Lloyd Huger last week. Patient had a steroid injection to the left trochanteric bursa. Patient reports that he felt some relief for one day. Continues to experience the same pain on the left hip area. Patient reports that he will see Dr. Lloyd Huger on the 10th of this month.  Patient also reports to have associated left lateral calf pain. Reports that pain is experienced throughout the day without any alleviating factors. Pain is aggravated with with exercise but is also experienced at rest. Pain is described as sharp and localized left lateral calf area. Denies any calf tenderness. Denies any recent surgery, immobilization more than 3 days. Denies any erythema or swelling  Review of Systems    Denies shortness of breath, chest pain, palpitations, diaphoresis, orthopnea, abdominal pain, constipation, diarrhea, bowel or bladder incontinence, new weakness or numbness in lower extremities. Objective:   Physical Exam  Constitutional: He is oriented to person, place, and time. He appears well-developed.  HENT:  Mouth/Throat: Oropharynx is clear and moist.  Eyes: EOM are normal. Pupils are equal, round, and reactive to light.  Neck: Neck supple.  Cardiovascular: Normal rate, regular rhythm and normal heart sounds.   Pulmonary/Chest: Effort normal and breath sounds normal. No respiratory distress. He has no wheezes.  Abdominal: Soft. Bowel sounds are normal.  Musculoskeletal:       Left hip: He exhibits decreased range of motion and tenderness.       Left lower leg: He exhibits tenderness. He exhibits no swelling and no edema.         Tenderness located lateral area of left calf. Normal dorsiflexion and plantar  flexion. Negative Homans sign  Neurological: He is alert and oriented to person, place, and time. He has normal reflexes. No cranial nerve deficit.  Skin: No erythema.  Psychiatric: He has a normal mood and affect.                 Assessment & Plan:  1) Left hip trochanteric bursitis-  Continue Vicodin as prescribed. Followup appointment with Dr. Lloyd Huger. Patient may need physical therapy. Followup in one month   2) L5-S1 radiculopathy  MRI of the lower spine done on July 15, 2009 shows patient has foraminal narrowing of L5-S1 which may be contributing to patient's described lateral left calf pain. Patient will be started on Neurontin. Patient may need to increase the Neurontin on followup. No evidence on physical exam to suggest DVT.  3) Hypertension:  Stable. Continue current regimen.  4) DM:  Controlled. Continue current regimen. Diabetic Eye exam done on 09/2009.   5) Hyperlipidemia:  FLP on 2010 shows LDL 30. Will recheck FLP on follow up and reassess.

## 2010-07-31 NOTE — Op Note (Signed)
Summary: Consent  Consent   Imported By: Marily Memos 07/22/2010 09:48:32  _____________________________________________________________________  External Attachment:    Type:   Image     Comment:   External Document

## 2010-07-31 NOTE — Progress Notes (Signed)
Summary: RX  Phone Note Refill Request Call back at Home Phone [phone] Message from:  Patient on July 23, 2010 9:51 AM  Refills Requested: Medication #1:  diltiazem 360 mg-1 pill 1 time a day [facility] [address] in [place]  Initial call taken by: Harlon Flor,  July 23, 2010 9:51 AM  Follow-up for Phone Call        Pt has appt with Dr.Crenshaw at 08/02/10 @ 12. Appt is at [address] office. Medication was sent to pharmacy. Follow-up by: Lysbeth Galas CMA,  July 23, 2010 10:17 AM    Prescriptions: CARDIZEM CD 240 MG XR24H-CAP (DILTIAZEM HCL COATED BEADS) Take 1 tablet by mouth once a day  #30 x 1   Entered by:   Lysbeth Galas CMA   Authorized by:   Ferman Hamming, MD, [facility]   Signed by:   Lysbeth Galas CMA on 07/23/2010   Method used:   [facility]ronically to        [facility] [phone]* (retail)       [address]       [place], [place]  [address]       Ph: [phone] or [phone]       Fax: [phone]   RxID:   [id number]

## 2010-08-02 ENCOUNTER — Ambulatory Visit (INDEPENDENT_AMBULATORY_CARE_PROVIDER_SITE_OTHER): Payer: Medicare Other | Admitting: Cardiology

## 2010-08-02 ENCOUNTER — Encounter: Payer: Self-pay | Admitting: Cardiology

## 2010-08-02 ENCOUNTER — Ambulatory Visit: Payer: Self-pay | Admitting: Cardiology

## 2010-08-02 DIAGNOSIS — I10 Essential (primary) hypertension: Secondary | ICD-10-CM

## 2010-08-02 DIAGNOSIS — I472 Ventricular tachycardia: Secondary | ICD-10-CM

## 2010-08-05 ENCOUNTER — Telehealth (INDEPENDENT_AMBULATORY_CARE_PROVIDER_SITE_OTHER): Payer: Self-pay | Admitting: *Deleted

## 2010-08-05 ENCOUNTER — Other Ambulatory Visit: Payer: Self-pay | Admitting: *Deleted

## 2010-08-05 MED ORDER — ALBUTEROL 90 MCG/ACT IN AERS: 2.0000 | INHALATION_SPRAY | RESPIRATORY_TRACT | Status: DC | PRN

## 2010-08-05 NOTE — Telephone Encounter (Signed)
Last refill 03/18/2009

## 2010-08-06 NOTE — Telephone Encounter (Signed)
Reviewing chart.

## 2010-08-08 NOTE — Assessment & Plan Note (Signed)
Summary: prev pt of dr Mariah Milling wants to be seen in [place] for continued...   Primary Provider:  Jackson Latino MD  CC:  referal from Dr. Threasa Beards .  History of Present Illness: 60 year old male for evaluation of SVT. Echocardiogram in October of 2009 showed normal LV function.  Patient previously followed at [facility] heart and vascular. He apparently had an episode of SVT in 2006 that required adenosine. He also has had testing at [facility] heart and vascular but I do not have those records available. He denies dyspnea on exertion, orthopnea, PND, pedal edema, palpitations, syncope or chest pain. He's had no recurrent episodes of SVT.  Current Medications (verified): 1)  Spiriva Handihaler 18 Mcg Caps (Tiotropium Bromide Monohydrate) .... Inhale The Contents of 1 Capsule Once Daily 2)  Advair Diskus 250-50 Mcg/dose Aepb (Fluticasone-Salmeterol) .... Take 1 Puff Twice Daily 3)  Albuterol 90 Mcg/act Aers (Albuterol) .... Inhale 2 Puffs Four Times A Day As Needed 4)  Viagra 25 Mg Tabs (Sildenafil Citrate) .... Use As Directed 5)  Cardizem Cd 240 Mg Xr24h-Cap (Diltiazem Hcl Coated Beads) .... Take 1 Tablet By Mouth Once A Day 6)  Zocor 40 Mg Tabs (Simvastatin) .... Take 1 Tablet By Mouth Once A Day 7)  Anacin 81 Mg Tbec (Aspirin) .... Take 1 Tablet By Mouth Once A Day 8)  Glipizide 5 Mg Tabs (Glipizide) .... Take 1 Tablet By Mouth Once A Day 9)  Metformin Hcl 500 Mg Tabs (Metformin Hcl) .... Take 1 Tablet By Mouth Two Times A Day 10)  Lisinopril 2.5 Mg Tabs (Lisinopril) .... Take 1 Tablet By Mouth Once A Day 11)  Vicodin Es 7.5-750 Mg Tabs (Hydrocodone-Acetaminophen) .... Take 1 Tablet By Mouth Three Times A Day As Needed 12)  Ibuprofen 800 Mg Tabs (Ibuprofen) .Marland Kitchen.. 1 Tablet By Mouth Every 8 Hours As Needed For Pain. Take With Meals.  Allergies: No Known Drug Allergies  Past History:  Past Medical History: HYPERLIPIDEMIA ESSENTIAL HYPERTENSION, BENIGN  COPD  LIPOMA, SKIN DIABETES  MELLITUS, TYPE II  ERECTILE DYSFUNCTION  OSTEOARTHRITIS  Chronic Knee Pain since 2004- has been to ortho clinic, completed PT , refused steroid injection- will probably knee TKR in future. Has seen otrthopedic for knee replacement. Baker's cyst, left knee- ruptured. Hx SVT, requiring adenosine in 2006  Past Surgical History: No previous surgeries  Family History: Reviewed history from 07/08/2007 and no changes required. Throat CA Mother Family History Liver disease ( Cirrhosis) Father No premature CAD  Social History: Reviewed history from 11/12/2007 and no changes required. Former Smoker Stopped in 2008 No ETOH Disabled  Single   Review of Systems       Problems with back pain but no fevers or chills, productive cough, hemoptysis, dysphasia, odynophagia, melena, hematochezia, dysuria, hematuria, rash, seizure activity, orthopnea, PND, pedal edema, claudication. Remaining systems are negative.   Vital Signs:  Patient profile:   60 year old male Height:      73 inches Weight:      227 pounds BMI:     30.06 Pulse rate:   92 / minute Resp:     14 per minute BP sitting:   142 / 90  (left arm)  Vitals Entered By: Kem Parkinson (August 02, 2010 11:22 AM)  Physical Exam  General:  Well developed/well nourished in NAD Skin warm/dry Patient not depressed No peripheral clubbing Back-normal HEENT-normal/normal eyelids Neck supple/normal carotid upstroke bilaterally; no bruits; no JVD; no thyromegaly chest - CTA/ normal expansion CV - RRR/normal S1 and  S2; no rubs or gallops;  PMI nondisplaced; 1/6 systolic ejection murmur. Abdomen -NT/ND, no HSM, no mass, + bowel sounds, no bruit 2+ femoral pulses, no bruits Ext-no edema, chords, 2+ DP Neuro-grossly nonfocal     EKG  Procedure date:  08/02/2010  Findings:      Sinus rhythm with PACs.  Impression & Recommendations:  Problem # 1:  PSVT (ICD-427.0) Patient apparently with history of supraventricular  tachycardia. No recent recurrences. Continue calcium blocker. I will obtain all records from [facility] heart and vascular to review. His updated medication list for this problem includes:    Cardizem Cd 240 Mg Xr24h-cap (Diltiazem hcl coated beads) .Marland Kitchen... Take 1 tablet by mouth once a day    Anacin 81 Mg Tbec (Aspirin) .Marland Kitchen... Take 1 tablet by mouth once a day    Lisinopril 2.5 Mg Tabs (Lisinopril) .Marland Kitchen... Take 1 tablet by mouth once a day  Problem # 2:  HYPERLIPIDEMIA (ICD-272.4) Continue statin. Lipids and liver monitored by primary care. His updated medication list for this problem includes:    Zocor 40 Mg Tabs (Simvastatin) .Marland Kitchen... Take 1 tablet by mouth once a day  Problem # 3:  ESSENTIAL HYPERTENSION, BENIGN (ICD-401.1) Blood pressure mildly elevated. We will follow this and increase medications as needed. His updated medication list for this problem includes:    Cardizem Cd 240 Mg Xr24h-cap (Diltiazem hcl coated beads) .Marland Kitchen... Take 1 tablet by mouth once a day    Anacin 81 Mg Tbec (Aspirin) .Marland Kitchen... Take 1 tablet by mouth once a day    Lisinopril 2.5 Mg Tabs (Lisinopril) .Marland Kitchen... Take 1 tablet by mouth once a day  Problem # 4:  COPD (ICD-496)  His updated medication list for this problem includes:    Spiriva Handihaler 18 Mcg Caps (Tiotropium bromide monohydrate) ..... Inhale the contents of 1 capsule once daily    Advair Diskus 250-50 Mcg/dose Aepb (Fluticasone-salmeterol) .Marland Kitchen... Take 1 puff twice daily    Albuterol 90 Mcg/act Aers (Albuterol) ..... Inhale 2 puffs four times a day as needed  Problem # 5:  DIABETES MELLITUS, TYPE II (ICD-250.00)  His updated medication list for this problem includes:    Anacin 81 Mg Tbec (Aspirin) .Marland Kitchen... Take 1 tablet by mouth once a day    Glipizide 5 Mg Tabs (Glipizide) .Marland Kitchen... Take 1 tablet by mouth once a day    Metformin Hcl 500 Mg Tabs (Metformin hcl) .Marland Kitchen... Take 1 tablet by mouth two times a day    Lisinopril 2.5 Mg Tabs (Lisinopril) .Marland Kitchen... Take 1 tablet  by mouth once a day  Patient Instructions: 1)  Your physician wants you to follow-up in:ONE YEAR   You will receive a reminder letter in the mail two months in advance. If you don't receive a letter, please call our office to schedule the follow-up appointment.

## 2010-08-13 ENCOUNTER — Other Ambulatory Visit: Payer: Self-pay | Admitting: *Deleted

## 2010-08-13 DIAGNOSIS — M25552 Pain in left hip: Secondary | ICD-10-CM

## 2010-08-13 MED ORDER — HYDROCODONE-ACETAMINOPHEN 7.5-750 MG PO TABS: 1.0000 | ORAL_TABLET | Freq: Three times a day (TID) | ORAL | Status: DC | PRN

## 2010-08-13 NOTE — Telephone Encounter (Signed)
Rx called in 

## 2010-08-14 NOTE — Progress Notes (Signed)
  ROI faxed to [facility]  August 05, 2010 10:42 AM     Appended Document:  Records received from [facility] gave to [facility]

## 2010-08-30 ENCOUNTER — Encounter: Payer: Self-pay | Admitting: Internal Medicine

## 2010-08-30 ENCOUNTER — Other Ambulatory Visit: Payer: Self-pay | Admitting: Internal Medicine

## 2010-08-30 ENCOUNTER — Ambulatory Visit (INDEPENDENT_AMBULATORY_CARE_PROVIDER_SITE_OTHER): Payer: Medicare Other | Admitting: Internal Medicine

## 2010-08-30 VITALS — BP 110/72 | HR 73 | Temp 97.0°F | Resp 18 | Ht 73.0 in | Wt 227.7 lb

## 2010-08-30 DIAGNOSIS — M5417 Radiculopathy, lumbosacral region: Secondary | ICD-10-CM

## 2010-08-30 DIAGNOSIS — J449 Chronic obstructive pulmonary disease, unspecified: Secondary | ICD-10-CM

## 2010-08-30 DIAGNOSIS — IMO0002 Reserved for concepts with insufficient information to code with codable children: Secondary | ICD-10-CM

## 2010-08-30 DIAGNOSIS — E119 Type 2 diabetes mellitus without complications: Secondary | ICD-10-CM

## 2010-08-30 DIAGNOSIS — I10 Essential (primary) hypertension: Secondary | ICD-10-CM

## 2010-08-30 LAB — LIPID PANEL
HDL: 52 mg/dL (ref >39)
LDL Cholesterol: 64 mg/dL (ref 0–99)
Total CHOL/HDL Ratio: 2.5 Ratio
Triglycerides: 73 mg/dL (ref <150)

## 2010-08-30 MED ORDER — NAPROXEN 500 MG PO TABS: 500.0000 mg | ORAL_TABLET | Freq: Two times a day (BID) | ORAL | Status: DC

## 2010-08-30 MED ORDER — METFORMIN HCL 500 MG PO TABS: 500.0000 mg | ORAL_TABLET | Freq: Two times a day (BID) | ORAL | Status: DC

## 2010-08-30 NOTE — Assessment & Plan Note (Addendum)
CBG well controlled, Will check FLP, urine Alb/Cr ration and have eye referral for annual eye exam.

## 2010-08-30 NOTE — Progress Notes (Signed)
  Subjective:    Patient ID: [name], male    DOB: [birth year], 60 y.o.   MRN: [id number]  HPI Pt is a 60 yo AAM with PMH of DM type 2, HTN, HLD and OA came here for left hip pain. This has been several months, radiating to posterior side of left leg, diflonec and neurotin did not help except heating pad and vicodin. He has seen Sports Medicine Dr. Jennette Kettle about one month ago and  was given kenalog injection in left hip, and only lasted about one day.  He has no other c/o, including CP, SOB, or fever.  His CBG runs 130s.     Review of Systems  Constitutional: Negative for fever and chills.  HENT: Negative for sneezing and neck pain.   Respiratory: Negative for apnea, shortness of breath and wheezing.   Gastrointestinal: Negative for abdominal pain, blood in stool and abdominal distention.  Genitourinary: Negative for dysuria, urgency, frequency and hematuria.  Musculoskeletal: Positive for back pain and arthralgias. Negative for myalgias and joint swelling.  Skin: Negative for color change and rash.  Neurological: Negative for dizziness, syncope and numbness.       Objective:   Physical Exam  Constitutional: He is oriented to person, place, and time. He appears well-developed and well-nourished.  HENT:  Head: Normocephalic and atraumatic.  Eyes: Conjunctivae and EOM are normal. Pupils are equal, round, and reactive to light.  Neck: Normal range of motion. Neck supple. No JVD present. No thyromegaly present.  Cardiovascular: Normal rate, regular rhythm, normal heart sounds and intact distal pulses.   No murmur heard. Pulmonary/Chest: Effort normal and breath sounds normal. No respiratory distress.  Abdominal: Soft. Bowel sounds are normal. He exhibits no distension. There is no tenderness. There is no rebound and no guarding.  Musculoskeletal: Normal range of motion. He exhibits tenderness.       Lower back spinal tenderness and left straight leg raising test positive.     Neurological: He is alert and oriented to person, place, and time. He has normal reflexes. He displays normal reflexes. No cranial nerve deficit. Coordination normal.          Assessment & Plan:

## 2010-08-30 NOTE — Assessment & Plan Note (Signed)
BP well controlled and will continue current meds.

## 2010-08-30 NOTE — Patient Instructions (Signed)
Please continue exercise and stop ibuprofen and will start naproxen for your pain control. We will call you once the appointments with physical therapy and pain Clinic and eye exam are set up.  We will call you if any abnormal labs.

## 2010-08-30 NOTE — Assessment & Plan Note (Signed)
No SOB, has stopped smoking, will continue current bronchodilators.

## 2010-08-30 NOTE — Assessment & Plan Note (Addendum)
MRI in 06/2010 shows disc herniation and foraminal narrowing with some encroachment in    L5-S1 and L4-5, also positive left leg straight leg raising test, so he likely has disk herniation causing sciatica. He likely needs steroid injection in his low back and we will refer him to pain Clinic for this. Also have PT referral for exercise.  If this still not helpful, he may needs surgery, but now he refuses this. Meanwhile, change ibuprofen to naproxen for pain control such as heating pad and pain meds.

## 2010-08-31 ENCOUNTER — Other Ambulatory Visit: Payer: Self-pay | Admitting: Internal Medicine

## 2010-08-31 DIAGNOSIS — I10 Essential (primary) hypertension: Secondary | ICD-10-CM

## 2010-08-31 LAB — MICROALBUMIN / CREATININE URINE RATIO: Creatinine, Urine: 325.3 mg/dL

## 2010-09-03 NOTE — Letter (Signed)
Summary: [facility] records  [facility] records   Imported By: Kassie Mends 08/27/2010 10:44:33  _____________________________________________________________________  External Attachment:    Type:   Image     Comment:   External Document

## 2010-09-10 ENCOUNTER — Other Ambulatory Visit: Payer: Self-pay | Admitting: Internal Medicine

## 2010-09-11 ENCOUNTER — Other Ambulatory Visit: Payer: Self-pay | Admitting: *Deleted

## 2010-09-11 DIAGNOSIS — M25552 Pain in left hip: Secondary | ICD-10-CM

## 2010-09-11 MED ORDER — HYDROCODONE-ACETAMINOPHEN 7.5-750 MG PO TABS: 1.0000 | ORAL_TABLET | Freq: Three times a day (TID) | ORAL | Status: DC | PRN

## 2010-09-11 NOTE — Telephone Encounter (Signed)
Please call for refill.   Thanks.  

## 2010-09-11 NOTE — Telephone Encounter (Signed)
Hydrocodone-acetaminophen Rx called to [facility] pharmacy. Pt was called and made awared.

## 2010-09-17 ENCOUNTER — Ambulatory Visit: Payer: Medicare Other | Attending: Internal Medicine

## 2010-09-17 DIAGNOSIS — IMO0001 Reserved for inherently not codable concepts without codable children: Secondary | ICD-10-CM | POA: Insufficient documentation

## 2010-09-17 DIAGNOSIS — R5381 Other malaise: Secondary | ICD-10-CM | POA: Insufficient documentation

## 2010-09-17 DIAGNOSIS — M545 Low back pain, unspecified: Secondary | ICD-10-CM | POA: Insufficient documentation

## 2010-09-17 DIAGNOSIS — M79609 Pain in unspecified limb: Secondary | ICD-10-CM | POA: Insufficient documentation

## 2010-09-17 DIAGNOSIS — M2560 Stiffness of unspecified joint, not elsewhere classified: Secondary | ICD-10-CM | POA: Insufficient documentation

## 2010-09-19 ENCOUNTER — Ambulatory Visit: Payer: Medicare Other

## 2010-09-23 ENCOUNTER — Encounter: Payer: Self-pay | Admitting: Cardiology

## 2010-09-25 ENCOUNTER — Ambulatory Visit: Payer: Medicare Other | Attending: Internal Medicine

## 2010-09-25 ENCOUNTER — Other Ambulatory Visit: Payer: Self-pay | Admitting: Cardiology

## 2010-09-25 DIAGNOSIS — M545 Low back pain, unspecified: Secondary | ICD-10-CM | POA: Insufficient documentation

## 2010-09-25 DIAGNOSIS — M2560 Stiffness of unspecified joint, not elsewhere classified: Secondary | ICD-10-CM | POA: Insufficient documentation

## 2010-09-25 DIAGNOSIS — M79609 Pain in unspecified limb: Secondary | ICD-10-CM | POA: Insufficient documentation

## 2010-09-25 DIAGNOSIS — IMO0001 Reserved for inherently not codable concepts without codable children: Secondary | ICD-10-CM | POA: Insufficient documentation

## 2010-09-25 DIAGNOSIS — R5381 Other malaise: Secondary | ICD-10-CM | POA: Insufficient documentation

## 2010-10-01 LAB — GLUCOSE, CAPILLARY: Glucose-Capillary: 102 mg/dL — ABNORMAL HIGH (ref 70–99)

## 2010-10-02 ENCOUNTER — Ambulatory Visit: Payer: Medicare Other

## 2010-10-03 LAB — GLUCOSE, CAPILLARY: Glucose-Capillary: 98 mg/dL (ref 70–99)

## 2010-10-07 ENCOUNTER — Ambulatory Visit: Payer: Medicare Other | Admitting: Physical Medicine & Rehabilitation

## 2010-10-07 ENCOUNTER — Encounter: Payer: Medicare Other | Attending: Physical Medicine & Rehabilitation

## 2010-10-07 DIAGNOSIS — M79609 Pain in unspecified limb: Secondary | ICD-10-CM | POA: Insufficient documentation

## 2010-10-07 DIAGNOSIS — Z79899 Other long term (current) drug therapy: Secondary | ICD-10-CM | POA: Insufficient documentation

## 2010-10-07 DIAGNOSIS — IMO0002 Reserved for concepts with insufficient information to code with codable children: Secondary | ICD-10-CM

## 2010-10-07 DIAGNOSIS — I10 Essential (primary) hypertension: Secondary | ICD-10-CM | POA: Insufficient documentation

## 2010-10-07 DIAGNOSIS — E119 Type 2 diabetes mellitus without complications: Secondary | ICD-10-CM | POA: Insufficient documentation

## 2010-10-07 DIAGNOSIS — J45909 Unspecified asthma, uncomplicated: Secondary | ICD-10-CM | POA: Insufficient documentation

## 2010-10-07 DIAGNOSIS — M48061 Spinal stenosis, lumbar region without neurogenic claudication: Secondary | ICD-10-CM

## 2010-10-07 DIAGNOSIS — M5137 Other intervertebral disc degeneration, lumbosacral region: Secondary | ICD-10-CM

## 2010-10-07 LAB — DIFFERENTIAL
Basophils Absolute: 0.1 10*3/uL (ref 0.0–0.1)
Basophils Relative: 1 % (ref 0–1)
Eosinophils Absolute: 0.1 10*3/uL (ref 0.0–0.7)
Eosinophils Relative: 2 % (ref 0–5)
Monocytes Absolute: 0.7 10*3/uL (ref 0.1–1.0)
Neutro Abs: 3.6 10*3/uL (ref 1.7–7.7)

## 2010-10-07 LAB — CBC
HCT: 35.5 % — ABNORMAL LOW (ref 39.0–52.0)
Platelets: 190 10*3/uL (ref 150–400)
WBC: 6.7 10*3/uL (ref 4.0–10.5)

## 2010-10-07 LAB — GLUCOSE, CAPILLARY
Glucose-Capillary: 170 mg/dL — ABNORMAL HIGH (ref 70–99)
Glucose-Capillary: 174 mg/dL — ABNORMAL HIGH (ref 70–99)
Glucose-Capillary: 178 mg/dL — ABNORMAL HIGH (ref 70–99)
Glucose-Capillary: 186 mg/dL — ABNORMAL HIGH (ref 70–99)
Glucose-Capillary: 250 mg/dL — ABNORMAL HIGH (ref 70–99)
Glucose-Capillary: 269 mg/dL — ABNORMAL HIGH (ref 70–99)
Glucose-Capillary: 392 mg/dL — ABNORMAL HIGH (ref 70–99)
Glucose-Capillary: 450 mg/dL — ABNORMAL HIGH (ref 70–99)
Glucose-Capillary: >600 mg/dL (ref 70–99)

## 2010-10-07 LAB — BASIC METABOLIC PANEL
BUN: 13 mg/dL (ref 6–23)
BUN: 14 mg/dL (ref 6–23)
BUN: 30 mg/dL — ABNORMAL HIGH (ref 6–23)
BUN: 5 mg/dL — ABNORMAL LOW (ref 6–23)
BUN: 5 mg/dL — ABNORMAL LOW (ref 6–23)
BUN: 9 mg/dL (ref 6–23)
CO2: 20 mEq/L (ref 19–32)
CO2: 22 mEq/L (ref 19–32)
CO2: 24 mEq/L (ref 19–32)
CO2: 24 mEq/L (ref 19–32)
CO2: 25 mEq/L (ref 19–32)
CO2: 25 mEq/L (ref 19–32)
CO2: 25 mEq/L (ref 19–32)
CO2: 25 mEq/L (ref 19–32)
CO2: 25 mEq/L (ref 19–32)
Calcium: 8.1 mg/dL — ABNORMAL LOW (ref 8.4–10.5)
Calcium: 8.1 mg/dL — ABNORMAL LOW (ref 8.4–10.5)
Calcium: 8.4 mg/dL (ref 8.4–10.5)
Calcium: 8.6 mg/dL (ref 8.4–10.5)
Calcium: 9.3 mg/dL (ref 8.4–10.5)
Chloride: 103 mEq/L (ref 96–112)
Chloride: 106 mEq/L (ref 96–112)
Chloride: 106 mEq/L (ref 96–112)
Chloride: 109 mEq/L (ref 96–112)
Chloride: 110 mEq/L (ref 96–112)
Chloride: 112 mEq/L (ref 96–112)
Creatinine, Ser: 0.87 mg/dL (ref 0.4–1.5)
Creatinine, Ser: 1.1 mg/dL (ref 0.4–1.5)
Creatinine, Ser: 1.15 mg/dL (ref 0.4–1.5)
Creatinine, Ser: 1.39 mg/dL (ref 0.4–1.5)
Creatinine, Ser: 1.78 mg/dL — ABNORMAL HIGH (ref 0.4–1.5)
GFR calc Af Amer: 48 mL/min — ABNORMAL LOW (ref >60)
GFR calc Af Amer: >60 mL/min (ref >60)
GFR calc Af Amer: >60 mL/min (ref >60)
GFR calc Af Amer: >60 mL/min (ref >60)
GFR calc Af Amer: >60 mL/min (ref >60)
GFR calc Af Amer: >60 mL/min (ref >60)
GFR calc Af Amer: >60 mL/min (ref >60)
GFR calc non Af Amer: 52 mL/min — ABNORMAL LOW (ref >60)
GFR calc non Af Amer: >60 mL/min (ref >60)
GFR calc non Af Amer: >60 mL/min (ref >60)
GFR calc non Af Amer: >60 mL/min (ref >60)
GFR calc non Af Amer: >60 mL/min (ref >60)
GFR calc non Af Amer: >60 mL/min (ref >60)
Glucose, Bld: 222 mg/dL — ABNORMAL HIGH (ref 70–99)
Glucose, Bld: 224 mg/dL — ABNORMAL HIGH (ref 70–99)
Glucose, Bld: 227 mg/dL — ABNORMAL HIGH (ref 70–99)
Glucose, Bld: 369 mg/dL — ABNORMAL HIGH (ref 70–99)
Glucose, Bld: 417 mg/dL — ABNORMAL HIGH (ref 70–99)
Glucose, Bld: 493 mg/dL — ABNORMAL HIGH (ref 70–99)
Glucose, Bld: 580 mg/dL (ref 70–99)
Potassium: 3.7 mEq/L (ref 3.5–5.1)
Potassium: 3.7 mEq/L (ref 3.5–5.1)
Potassium: 3.8 mEq/L (ref 3.5–5.1)
Potassium: 3.8 mEq/L (ref 3.5–5.1)
Potassium: 3.9 mEq/L (ref 3.5–5.1)
Potassium: 3.9 mEq/L (ref 3.5–5.1)
Sodium: 130 mEq/L — ABNORMAL LOW (ref 135–145)
Sodium: 134 mEq/L — ABNORMAL LOW (ref 135–145)
Sodium: 136 mEq/L (ref 135–145)
Sodium: 136 mEq/L (ref 135–145)
Sodium: 138 mEq/L (ref 135–145)
Sodium: 138 mEq/L (ref 135–145)
Sodium: 138 mEq/L (ref 135–145)

## 2010-10-07 LAB — CARDIAC PANEL(CRET KIN+CKTOT+MB+TROPI)
CK, MB: 3 ng/mL (ref 0.3–4.0)
CK, MB: 3.1 ng/mL (ref 0.3–4.0)
Relative Index: 1.8 (ref 0.0–2.5)
Total CK: 144 U/L (ref 7–232)
Total CK: 170 U/L (ref 7–232)
Troponin I: 0.01 ng/mL (ref 0.00–0.06)
Troponin I: 0.01 ng/mL (ref 0.00–0.06)

## 2010-10-07 LAB — COMPREHENSIVE METABOLIC PANEL
ALT: 17 U/L (ref 0–53)
AST: 23 U/L (ref 0–37)
Albumin: 3.6 g/dL (ref 3.5–5.2)
Alkaline Phosphatase: 71 U/L (ref 39–117)
BUN: 25 mg/dL — ABNORMAL HIGH (ref 6–23)
Chloride: 100 mEq/L (ref 96–112)
Potassium: 3.9 mEq/L (ref 3.5–5.1)
Sodium: 134 mEq/L — ABNORMAL LOW (ref 135–145)
Total Bilirubin: 0.7 mg/dL (ref 0.3–1.2)

## 2010-10-07 LAB — URINALYSIS, ROUTINE W REFLEX MICROSCOPIC
Glucose, UA: >1000 mg/dL — AB
Hgb urine dipstick: NEGATIVE
Ketones, ur: 15 mg/dL — AB
Protein, ur: NEGATIVE mg/dL

## 2010-10-07 LAB — URINE MICROSCOPIC-ADD ON

## 2010-10-07 LAB — MAGNESIUM: Magnesium: 2.1 mg/dL (ref 1.5–2.5)

## 2010-10-08 ENCOUNTER — Other Ambulatory Visit (INDEPENDENT_AMBULATORY_CARE_PROVIDER_SITE_OTHER): Payer: Medicare Other | Admitting: *Deleted

## 2010-10-08 DIAGNOSIS — M25552 Pain in left hip: Secondary | ICD-10-CM

## 2010-10-08 DIAGNOSIS — M25559 Pain in unspecified hip: Secondary | ICD-10-CM

## 2010-10-08 MED ORDER — SIMVASTATIN 40 MG PO TABS: 40.0000 mg | ORAL_TABLET | Freq: Every day | ORAL | Status: DC

## 2010-10-08 MED ORDER — HYDROCODONE-ACETAMINOPHEN 7.5-750 MG PO TABS: 1.0000 | ORAL_TABLET | Freq: Three times a day (TID) | ORAL | Status: DC | PRN

## 2010-10-08 NOTE — Group Therapy Note (Signed)
REASON FOR VISIT:  Left-sided lower extremity pain involving the calf.  HISTORY:  A 60 year old male who had insidious onset of left lower extremity pain which was evaluated by his primary care physician.  He was sent to [facility] Medicine Clinic, where he received a intramuscular corticosteroid injection with little improvement.  He underwent MRI of the lumbar spine dated July 15, 2010, demonstrating a disk bulge and superimposed left lateral recess protrusion L5-S1 resulting in encroachment of left S1 nerve root.  In addition, had broad-based disk bulge in combination with facet degenerative changes and ligamentum flavum hypertrophy resulting in moderate narrowing at L4-5 and also encroaching upon the exiting left L4 nerve root.  Most recently, the patient underwent some physical therapy and he feels like he is getting better.  He has not had any new medical problems in the interval time.  PAST MEDICAL HISTORY:  Significant for diabetes, hypertension and asthma.  CURRENT MEDICATIONS: 1. Metformin 500 mg a day. 2. Glipizide 5 mg a day. 3. Lisinopril 2.5 daily. 4. Simvastatin 40 mg a day. 5. Hydrocodone 7.5 prescribed is 3 times a day, but really is just     down once a day. 6. Spiriva 1 puff per day. 7. Advair 1 puff b.i.d. 8. Proventil p.r.n.  ALLERGIES:  To LATEX causing swelling.  Pain level is 4/10.  Sleep is fair.  His standing tolerance is 3 hours before he has exacerbation of his left lower extremity pain.  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  He has been on disability for about 7 years.  REVIEW OF SYSTEMS:  Positive for shortness of breath, otherwise negative for bowel and bladder dysfunction.  PAST HISTORY:  As noted, diabetes.  Most recent hemoglobin A1c is 6.3. Bilateral knee OA reference to possible TKR in future.  Negative response to injections.  SOCIAL HISTORY:  Single.  Nonsmoker, nondrinker.  PHYSICAL EXAMINATION:  VITAL SIGNS:   Blood pressure 133/95, pulse 86, respirations 18 and O2 sat 96% on room air. GENERAL:  No acute distress.  Mood and affect are appropriate. EXTREMITIES:  His gait shows no evidence of toe drag or knee instability.  He does heel walk, although he seems slightly weaker in terms of his dorsiflexion on the left side compared to the right side when he is using it functionally.  On manual muscle testing, he has equal strength bilateral hip flexor, knee extensor and ankle dorsiflexor.  His sensation is intact to the light touch, pinprick and pinprick at bilateral L3, 4, 5, S1 dermatomal distribution.  His knees have bilateral crepitus, but full range of motion.  Hip range of motion is full.  Ankle range of motion is full without pain.  There is no evidence of erythema.  IMPRESSION:  Primarily L4 and S1 radiculopathies overall improving with physical therapy.  He has reduced his pain medications to once a day. Overall, he is happy with his progress.  RECOMMENDATIONS:  At this point, we will start gabapentin working up to 100 mg t.i.d. over the course of the next couple weeks.  I will see him back in 1 month.  He will complete his physical therapy to see how he is doing on the Neurontin and if he still is not back to his baseline, we would do a epidural steroid injection L4-5 translaminar and failing this would do left L4 and left S1 transforaminal.  I discussed with the patient, agrees with plan.  Thank you for interesting consultation.  Erick Colace, M.D. Electronically Signed    AEK/MedQ D:  10/07/2010 11:07:32  T:  10/08/2010 00:41:33  Job #:  [id number]  cc:   Jackson Latino, MD

## 2010-10-08 NOTE — Telephone Encounter (Signed)
Please call pharmacy for vicodin.   Thanks.

## 2010-10-10 NOTE — Telephone Encounter (Signed)
Called to pharm 

## 2010-10-14 ENCOUNTER — Ambulatory Visit: Payer: Medicare Other

## 2010-10-15 ENCOUNTER — Ambulatory Visit: Payer: Medicare Other

## 2010-11-05 ENCOUNTER — Ambulatory Visit: Payer: Medicare Other | Admitting: Physical Medicine & Rehabilitation

## 2010-11-05 ENCOUNTER — Encounter: Payer: Medicare Other | Attending: Physical Medicine & Rehabilitation

## 2010-11-05 DIAGNOSIS — I10 Essential (primary) hypertension: Secondary | ICD-10-CM | POA: Insufficient documentation

## 2010-11-05 DIAGNOSIS — Z79899 Other long term (current) drug therapy: Secondary | ICD-10-CM | POA: Insufficient documentation

## 2010-11-05 DIAGNOSIS — E119 Type 2 diabetes mellitus without complications: Secondary | ICD-10-CM | POA: Insufficient documentation

## 2010-11-05 DIAGNOSIS — M546 Pain in thoracic spine: Secondary | ICD-10-CM

## 2010-11-05 DIAGNOSIS — M79609 Pain in unspecified limb: Secondary | ICD-10-CM | POA: Insufficient documentation

## 2010-11-05 DIAGNOSIS — J45909 Unspecified asthma, uncomplicated: Secondary | ICD-10-CM | POA: Insufficient documentation

## 2010-11-05 NOTE — Discharge Summary (Signed)
NAMEVITALY, [name]          ACCOUNT NO.:  [id number]   MEDICAL RECORD NO.:  [id number]          PATIENT TYPE:  INP   LOCATION:  3707                         FACILITY:  [facility]   PHYSICIAN:  Acey Lav, MD  DATE OF BIRTH:  1951/05/26   DATE OF ADMISSION:  04/18/2008  DATE OF DISCHARGE:  04/19/2008                               DISCHARGE SUMMARY   DISCHARGE DIAGNOSES:  1. Supraventricular tachycardia.  2. Chest pain, ruled out myocardial infarction.  3. Chronic obstructive pulmonary disease.  4. Hypertension.  5. Hyperlipidemia.   DISCHARGE MEDICATIONS:  1. Diltiazem 120 mg oral once daily.  2. Zocor 40 mg oral once daily.  3. Aspirin 81 mg once daily.  4. Sterapred 5 mg taper for 5 days, 5 tabs on day 1, 4 tabs on day 2,      3 tabs on day 3, 2 tabs on day 4, and 1 tab on day 5.   DISPOSITION AND FOLLOWUP:  Followup with Dr. Mariah Milling on May 05, 2008, at 9:15 a.m. to follow up for the SVTs and diltiazem.  Followup  with Dr. Radonna Ricker on May 17, 2008, at 2:50 p.m. for hospitalization  followup.   PROCEDURES PERFORMED:  None.   CONSULTATIONS:  [facility], Dr. Antonieta Iba.   BRIEF ADMITTING HISTORY AND PHYSICAL:  This is a 60 year old African  American male with COPD and a history of tobacco abuse and a history of  elevated blood pressures who presented to the ED with chest pain.  The  chest pain began 2 weeks prior and occurred in the left chest, but does  not radiate.  The pain occurs with exertion and last for 45 minutes with  rest.  The patient denies shortness of breath, diaphoresis, syncope,  nausea, or vomiting.  The chest pain is not worse with inspiration,  palpation, or positioning.  The patient also endorse episodes of  palpation in the last month and never before have these episodes been  associated with chest pain.   ADMITTING VITAL SIGNS:  Temperature 97.6, blood pressure was 156/96,  heart rate was 90, respiratory rate was  18, oxygen saturation was 97% on  room air.   PHYSICAL EXAMINATION:  GENERAL:  The patient was not in any apparent  distress.  RESPIRATORY:  He had diffuse rhonchi in the left lung, mid, and lower  lobes, and mild wheeze on the anterior lung fields.  CARDIOVASCULAR:  Showed regular rate and rhythm.  No murmurs, rubs, or  gallops, and he did not have a JVD.  GI:  Soft, nontender, nondistended, and have positive bowel sounds.  EXTREMITIES:  No edema.  SKIN:  He did have urticaria on the right and left forearm where the  tourniquet had been placed.  MS:  He had 5/5 strength throughout.  NEUROLOGIC:  He is alert and oriented x3.  Cranial nerves II through XII  intact with no focal deficit.   ADMITTING LABS:  Sodium was 141, potassium 3.5, chloride 104, bicarb 30,  BUN was 7, creatinine was 0.9, and glucose was 109, calcium was 9.4.  White blood count was 7.4, hemoglobin  was 13.6, hematocrit was 40,  platelets were 281.  Cardiac enzymes showed a CK-MB of 2.6 and a  troponin less than 0.05, and a BNP less than 30.  Cholesterol showed  173, triglycerides were 153, HDL 27, LDL 115, VLDL 31, TSH of 1.728, and  a B12 of 392.  A chest x-ray showed mild vascular congestion.  Initial  EKG showed a normal sinus rate with pseudonormalization of T-wave  inversions in the inferior leads when compared to an EKG done on April 13, 2008, in the clinic.   HOSPITAL COURSE:  1. Supraventricular tachycardia.  When the patient returned to his      room on April 18, 2008, after his Myoview and 2-D echo, he was      noted to have a heart rate of 160.  He did note a sense of flutter      and this was similar to the sensation he had felt over the past few      weeks.  An EKG showed a regular SVT pattern.  [facility] was consulted and promptly responded.  The heart rate      failed to normalize with carotid massage.  The patient quickly      returned to a normal sinus rhythm after  receiving adenosine 6 mg,      he was started on diltiazem 30 mg every 6 hours with parameters to      hold as his heart rate was less than 70 or systolic blood pressure      less than 110.  The patient tolerated the diltiazem and remained in      a normal sinus rate.  TSH and magnesium were within normal limits.  2. Chest pain.  The patient remained stable and his chest pain      resolved with morphine and nitroglycerin, and aspirin and statin      were started.  Cardiac enzymes were negative x3.  A urine drug      screen was negative.  A chest x-ray showed only chronic changes      consistent with COPD and an adenosine Myoview was negative.  While      his story is concerning for an acute coronary syndrome, the workup      remained negative.  This is the most likely discomfort from the      arrhythmia, he has been experiencing over the past couple of weeks.  3. Chronic obstructive pulmonary disease.  Chest x-ray consistent with      chronic changes of COPD.  The patient was started on Atrovent every      2 hours and albuterol every 4 hours as needed.  Initially, steroids      were held because he was asymptomatic and his oxygen saturation was      appropriate on room air.  During his stay, he did note to have a      cough and a slight wheeze on exam.  Solu-Medrol IV was started and      changed to oral prednisone before discharge.  I do believe this is      why his glucose was elevated on his discharge day labs.  He is a      former smoker and was counseled on the importance of remaining      tobacco free.  4. Hypertension.  The patient's blood pressures were elevated on      admission.  Previous  records of visits in the emergency department      revealed multiple elevated blood pressure readings of systolic      pressures in the 130s-150s over diastolic pressures in the 90s.      The patient was started on diltiazem to treat both his SVTs and      elevated blood pressure.  The patient  tolerated the medication      without any difficulty.  5. Hyperlipidemia.  Labs on admission revealed an elevated LDL and a      low HDL.  It was felt that he would benefit from a statin.  Zocor      40 mg daily was initiated.   DISCHARGE VITAL SIGNS:  Temperature was 98.7, blood pressure was 130/96,  heart rate was 72, respiratory rate was 18, and he was satting 96% on  room air, and 94% with ambulation.   DISCHARGE LABS:  His CBC, white blood count was 8.0, hemoglobin 15.0,  and platelets 301.  Sodium was 140, potassium was 5.0, chloride was 105,  and bicarb was 27, BUN was 10, creatinine was 0.93, glucose was 206,  magnesium was 2.4.      Marinda Elk, M.D.  Electronically Signed      Acey Lav, MD  Electronically Signed    AF/MEDQ  D:  04/19/2008  T:  04/20/2008  Job:  [id number]   cc:   Antonieta Iba, MD  Dr. Andrey Campanile

## 2010-11-06 ENCOUNTER — Other Ambulatory Visit: Payer: Self-pay | Admitting: Physical Medicine & Rehabilitation

## 2010-11-06 DIAGNOSIS — R222 Localized swelling, mass and lump, trunk: Secondary | ICD-10-CM

## 2010-11-06 NOTE — Assessment & Plan Note (Signed)
CHIEF COMPLAINT:  Right-sided back pain.  A 60 year old male who had insidious onset left lower extremity pain. MRI of the lumbar spine dated July 15, 2010, showing a disk bulge, superimposed left lateral recess protrusion L5-S1, encroachment of S1 nerve root.  He had some physical therapy, was getting better.  He returns today stating that pain has improved, but now is having pain in the right side low back.  He has had no new medical problems in the interval time.  PAST MEDICAL HISTORY:  Significant for diabetes, hypertension, COPD.  ALLERGIES:  To LATEX.  Pain level is reported as a 10 on the right side of his low to mid-back area.  His pain pretty much all the day.  He drives.  He is independent with all his self care, he has some problems with his walking.  He has had no fevers or chills.  No bowel or bladder discomfort.  He has no lower extremity weakness or numbness.  SOCIAL HISTORY:  Divorced, lives by myself.  Denies any drug or alcohol use.  PHYSICAL EXAMINATION:  VITAL SIGNS:  Blood pressure 138/79, pulse 81, respirations 18, satting 95% on room air. GENERAL:  No acute distress.  Mood and affect appropriate. MUSCULOSKELETAL:  His back has some prominence of the right paraspinal muscles at T12-L1 on the right side, this is tender to palpation.  There is no evidence of fluctuance.  No evidence of induration.  His lower extremities have normal strength.  He has normal sensation, normal deep tendon reflexes.  His gait is normal.  IMPRESSION:  Right-sided T12-L1 paraspinal pain with some prominence to suggest underlying mass.  This appears to be intramuscular rather than superficial.  I discussed this with Radiology, they would recommend ultrasound for initial eval and if in no obvious mass and patient gets better, we will treat him conservatively.  If he continues have pain, 1- month followup after muscle relaxers and local care.  Then, we will proceed on with MRI,  although MRI of this particular area back in January was negative.  Discussed with patient, agrees with plan.     Erick Colace, M.D. Electronically Signed    AEK/MedQ D:  11/05/2010 12:40:25  T:  11/05/2010 23:59:09  Job #:  [id number]

## 2010-11-08 ENCOUNTER — Other Ambulatory Visit: Payer: Self-pay | Admitting: *Deleted

## 2010-11-08 ENCOUNTER — Ambulatory Visit
Admission: RE | Admit: 2010-11-08 | Discharge: 2010-11-08 | Disposition: A | Discharge status: Home / Self Care | Payer: Medicare Other | Source: Ambulatory Visit | Attending: Physical Medicine & Rehabilitation | Admitting: Physical Medicine & Rehabilitation

## 2010-11-08 DIAGNOSIS — R222 Localized swelling, mass and lump, trunk: Secondary | ICD-10-CM

## 2010-11-08 DIAGNOSIS — M25552 Pain in left hip: Secondary | ICD-10-CM

## 2010-11-08 MED ORDER — HYDROCODONE-ACETAMINOPHEN 7.5-750 MG PO TABS: 1.0000 | ORAL_TABLET | Freq: Three times a day (TID) | ORAL | Status: DC | PRN

## 2010-11-08 NOTE — Telephone Encounter (Signed)
Please call his pharmacy for this refill.  Thanks.

## 2010-11-08 NOTE — Telephone Encounter (Signed)
When i called vicodin in, pharmacist ask " do you realize that this is a 40 day supply?" i didn't notice til then, looking back, the directions back in the fall were 4x a day, pharmacist states he will need to tell pt that this script is a 40 day script and more than likely pt will be upset if you would like to change the directions to 4 x daily let me know and change med list to reflect that and i will call pharm.  Thank you, h.

## 2010-11-08 NOTE — Discharge Summary (Signed)
NAMEAABAN, GRIEP          ACCOUNT NO.:  [id number]   MEDICAL RECORD NO.:  [id number]          PATIENT TYPE:  INP   LOCATION:  6731                         FACILITY:  [facility]   PHYSICIAN:  Cliffton Asters, M.D.    DATE OF BIRTH:  August 01, 1950   DATE OF ADMISSION:  06/28/2008  DATE OF DISCHARGE:  07/01/2008                               DISCHARGE SUMMARY   DISCHARGE DIAGNOSES:  1. Diabetes mellitus type 2, newly diagnosed, A1c 12.9, complicated by      hyperosmolar hyperglycemic state.  2. Hypertension.  3. Hyperlipidemia.  4. History of supraventricular tachycardia requiring adenosine in      2006.  5. Chronic obstructive pulmonary disease.  6. History of tobacco abuse.  7. History of Baker cyst of the left knee, ruptured.  8. Osteoarthritis, severe bilateral osteoarthritis of the knees.  9. Erectile dysfunction.   DISCHARGE MEDICATIONS:  1. Spiriva HandiHaler 18 mcg capsules, inhale the contents of 1      capsule once daily.  2. Advair Diskus 100/50 mcg per dose, inhale 1 puff twice a day.  3. Albuterol 90 mcg aerosolized, inhale 2 puffs 4 times a day as      needed.  4. Vicodin 5/500 mg tabs, take 1 tablet p.o. q.i.d. p.r.n. pain.  5. Viagra 25 mg use as directed.  6. Cardizem CD 240 mg XR p.o. daily.  7. Zocor 40 mg p.o. daily.  8. Anacin 81 mg p.o. daily.  9. Hydrochlorothiazide 12.5 mg p.o. daily.  10.Glipizide 5 mg p.o. daily.  11.Metformin 500 mg p.o. b.i.d.   DISCHARGE CONDITION AND FOLLOWUP:  The patient was discharged in stable  condition, his hyperglycemia and hyperosmolar state having resolved with  therapy.  His glucose was controlled on oral hypoglycemics.  He is to  follow up with Dr. Polly Cobia in the [facility] on July 05, 2008, at 1:30 p.m.  At that time, items to address include the  patient's adherence to his diabetes medications as well as whether he  has been able to check his blood sugars.  He would likely benefit from  referral  to Dorthula Matas for further diabetes education.  In addition,  the patient would likely benefit from further reinforcement of the  importance of lifestyle modification.   BRIEF ADMITTING HISTORY AND PHYSICAL:  For complete details, please see  the paper chart, but in brief, Mr. Jacobsen is a 60 year old man with  past medical history outlined above, who came to clinic for regular  followup and was found to have a blood glucose of 900 and newly-  diagnosed Type II Diabetes.  He had had polydipsia, polyuria, blurred  vision, fatigue and a dry mouth for several weeks.   PHYSICAL EXAMINATION:  VITAL SIGNS:  Temperature 98.2, blood pressure  123/82, pulse 89.  GENERAL:  Pupils were equal, round, and reactive to light.  LUNGS:  Normal respiratory effort.  Good air entry bilaterally with  bibasilar crackles present.  HEART:  Normal rate and regular rhythm with no murmurs, rubs, or  gallops.  There is no peripheral edema.  NEUROLOGIC:  Nonfocal.   LABORATORY DATA:  Sodium 123, potassium 4.4, chloride 85, bicarb 24,  glucose 900, BUN 39, and creatinine 2.29.  White blood count 8.0,  hemoglobin 13.1, and platelets 210.   HOSPITAL COURSE:  Hyperglycemic hyperosmolar state:  The patient was  admitted to the step-down unit and placed on an insulin drip.  His  glucose was gradually corrected and he was given aggressive IV  hydration.  He was subsequently transitioned to glipizide as well as  sliding-scale insulin.  As his renal function continued to improve with  IV hydration, he was started on metformin.  As he tolerated glipizide  and metformin well, he was discharged on these medications as an  outpatient.  He is to follow up with Dr. Polly Cobia in the Outpatient Clinic  as noted above.  Due to concern about the patient's renal function  especially in the context of just having started metformin, his  diclofenac for his osteoarthritis was discontinued.  He had already  learned how to measure his  own glucose and met with the diabetes  educator, Dorthula Matas, prior to his admission to the hospital.  He had  a meter to check his blood sugars already.  He planned to get help with  this from family members.   DISCHARGE LABORATORY DATA AND VITALS:  Temperature 97.9, pulse 81,  respirations 16, blood pressure 113/77, and oxygen saturation 97% on  room air.  Sodium 138, potassium 3.8, chloride 110, bicarb 25, glucose  170, BUN 6, creatinine 0.93, and calcium 8.3.      Loel Dubonnet, MD  Electronically Signed      Cliffton Asters, M.D.  Electronically Signed    PN/MEDQ  D:  07/06/2008  T:  07/07/2008  Job:  [id number]   cc:   Zara Council, MD

## 2010-11-11 ENCOUNTER — Other Ambulatory Visit: Payer: Self-pay | Admitting: Internal Medicine

## 2010-11-11 DIAGNOSIS — M25552 Pain in left hip: Secondary | ICD-10-CM

## 2010-11-11 MED ORDER — HYDROCODONE-ACETAMINOPHEN 7.5-750 MG PO TABS: 1.0000 | ORAL_TABLET | Freq: Four times a day (QID) | ORAL | Status: DC | PRN

## 2010-11-11 NOTE — Telephone Encounter (Signed)
Pharm made aware of change in directions

## 2010-11-11 NOTE — Telephone Encounter (Signed)
I will change it to q6h prn and please call the pharmacy about the change.  Thanks.

## 2010-11-13 ENCOUNTER — Ambulatory Visit (INDEPENDENT_AMBULATORY_CARE_PROVIDER_SITE_OTHER): Payer: Medicare Other | Admitting: Internal Medicine

## 2010-11-13 VITALS — BP 125/83 | HR 90 | Temp 98.3°F | Ht 73.0 in | Wt 230.5 lb

## 2010-11-13 DIAGNOSIS — IMO0002 Reserved for concepts with insufficient information to code with codable children: Secondary | ICD-10-CM

## 2010-11-13 DIAGNOSIS — M5417 Radiculopathy, lumbosacral region: Secondary | ICD-10-CM

## 2010-11-13 DIAGNOSIS — E119 Type 2 diabetes mellitus without complications: Secondary | ICD-10-CM

## 2010-11-13 DIAGNOSIS — I10 Essential (primary) hypertension: Secondary | ICD-10-CM

## 2010-11-13 LAB — GLUCOSE, CAPILLARY: Glucose-Capillary: 75 mg/dL (ref 70–99)

## 2010-11-13 LAB — POCT GLYCOSYLATED HEMOGLOBIN (HGB A1C): Hemoglobin A1C: 6.9

## 2010-11-13 NOTE — Assessment & Plan Note (Addendum)
Lab Results  Component Value Date   HGBA1C 6.9 11/13/2010   HGBA1C 6.3 07/09/2010   CREATININE 0.97 05/15/2010   MICROALBUR 5.18* 08/30/2010   MICRALBCREAT 15.9 08/30/2010   CHOL 131 08/30/2010   HDL 52 08/30/2010   TRIG 73 0/09/5407    Last eye exam and foot exam:    Component Value Date/Time   HMDIABEYEEXA no diabetic retinopathy; early hypertensive changes 09/27/2008   HMDIABFOOTEX done 05/15/2010    CBG runs about 120-140s, Well controlled and will continue current regimen.

## 2010-11-13 NOTE — Progress Notes (Signed)
Subjective:    Patient ID: [name], male    DOB: 11-Feb-1951, 60 y.o.   MRN: [id number]  HPI Patient is a 60 years old male with past medical history  as outlined here who comes to the Clinic for back pain. It started about 3 months ago and went to pain Clinic and was given flexeril, but has not gotten the refill.Moving makes it worse, resting makes it better. Has no fever, chill, chest pain, shortness of breath, hemoptysis, abdominal pain, nausea, vomiting, diarrhea, melena, dysuria, significant weight change. Denies recent smoking, alcohol or drug abuse. Has been taking all his medications as instructed. CBG runs 120-140s.    Review of Systems Per HPI.  Current Outpatient Medications Current Outpatient Prescriptions  Medication Sig Dispense Refill  . albuterol (PROVENTIL,VENTOLIN) 90 MCG/ACT inhaler Inhale 2 puffs into the lungs every 4 (four) hours as needed.  17 g  5  . aspirin (ANACIN) 81 MG EC tablet Take 81 mg by mouth daily.        Marland Kitchen diltiazem (TIAZAC) 240 MG 24 hr capsule TAKE 1 TABLET BY MOUTH ONCE A DAY  30 capsule  1  . Fluticasone-Salmeterol (ADVAIR DISKUS) 250-50 MCG/DOSE AEPB Inhale 1 puff into the lungs 2 (two) times daily.        Marland Kitchen glipiZIDE (GLUCOTROL) 5 MG tablet Take 5 mg by mouth daily.        Marland Kitchen HYDROcodone-acetaminophen (VICODIN ES) 7.5-750 MG per tablet Take 1 tablet by mouth every 6 (six) hours as needed.  120 tablet  0  . lisinopril (PRINIVIL,ZESTRIL) 2.5 MG tablet TAKE 1 TABLET BY MOUTH EVERY DAY  30 tablet  11  . metFORMIN (GLUCOPHAGE) 500 MG tablet TAKE 1 TABLET BY MOUTH TWO TIMES A DAY  62 tablet  6  . sildenafil (VIAGRA) 25 MG tablet Use as directed       . simvastatin (ZOCOR) 40 MG tablet Take 1 tablet (40 mg total) by mouth daily.  90 tablet  2  . tiotropium (SPIRIVA HANDIHALER) 18 MCG inhalation capsule Place 18 mcg into inhaler and inhale daily.        Marland Kitchen DISCONTD: diltiazem (CARDIZEM CD) 240 MG 24 hr capsule Take 240 mg by mouth daily.        .  cyclobenzaprine (FLEXERIL) 5 MG tablet Take 5 mg by mouth 2 (two) times daily with a meal.        . DISCONTD: naproxen (NAPROSYN) 500 MG tablet Take 1 tablet (500 mg total) by mouth 2 (two) times daily with a meal.  60 tablet  2    Allergies Latex  Past Medical History  Diagnosis Date  . Diabetes mellitus, type 2   . Hypertension   . Hyperlipidemia   . Lipoma of skin   . PSVT (paroxysmal supraventricular tachycardia)     Required adenosine 2006  . Trochanteric bursitis of left hip   . History of tobacco abuse     Year started: 96   Year quit: 01/2007  . COPD (chronic obstructive pulmonary disease)   . Erectile dysfunction   . Osteoarthritis   . Knee osteoarthritis     Bilateral,   Patient has been evaluated by Ortho for knee replacement in the past  . Baker's cyst, ruptured     No past surgical history on file.     Objective:   Physical Exam General: Vital signs reviewed and noted. Well-developed, well-nourished, in no acute distress; alert, appropriate and cooperative throughout examination.  Head: Normocephalic, atraumatic.  Neck: No deformities, masses, or tenderness noted.  Lungs:  Normal respiratory effort. Clear to auscultation BL without crackles or wheezes.  Heart: RRR. S1 and S2 normal without gallop, murmur, or rubs.  Abdomen:  BS normoactive. Soft, Nondistended, non-tender.  No masses or organomegaly.  Extremities: No pretibial edema.His back paraspinal muscle tenderness worse on right side, no mass noted.                                   Assessment & Plan:

## 2010-11-13 NOTE — Assessment & Plan Note (Signed)
Lab Results  Component Value Date   NA 142 05/15/2010   K 3.9 05/15/2010   CL 106 05/15/2010   CO2 26 05/15/2010   BUN 14 05/15/2010   CREATININE 0.97 05/15/2010    BP Readings from Last 3 Encounters:  11/13/10 125/83  08/30/10 110/72  08/02/10 142/90   BP at goal of target and continue current meds.

## 2010-11-13 NOTE — Patient Instructions (Signed)
Please take all your medications as instructed in your instructions.   Please bring your glucometer with you at next visit.   Back Pain - Chronic Back pain seldom lasts longer than 3 months. Long standing back pain can be caused by a ruptured disc. About half the time the exact cause cannot be found. Arthritis, osteoporosis, tumors, infections, previous back surgery, and other causes may be involved. Anxiety and depression are common factors. A ruptured disc often causes sciatica. This is a pain traveling from the low back down the back of the leg. This is due to irritation of the sciatic nerve. Weakness or numbness in the legs or feet and loss of normal bladder control are more serious signs of disc rupture. You should contact your doctor immediately if these symptoms develop. Avoid bending, heavy lifting, prolonged sitting, and activities which make the problem worse. Continue normal activity as much as possible. Take brief periods of rest throughout the day to reduce your pain during bad periods. A back exercise rehabilitation program can be helpful in reducing symptoms and preventing more pain. Muscle relaxants are sometimes used. Using narcotic pain medicine for long term pain is discouraged. Addiction is a possible outcome.  Surgery is considered only when symptoms do not improve with bed rest and other conservative treatment. You should see your caregiver if problems get worse. SEEK IMMEDIATE MEDICAL CARE IF:  You have marked weakness or numbness in one of your legs.   You have trouble controlling your bladder or bowels.   You develop nausea, vomiting, abdominal pain, shortness of breath or fainting.   You have severe pain not relieved with medications.  Document Released: 07/17/2004 Document Re-Released: 09/05/2008 [facility] Patient Information 2011 [place], [place].   Sciatica Sciatica is a name for lower back problems. Pressure on the sciatic nerve causes pain in the bottom (buttocks)  and back of the leg.  HOME CARE  Usually, pain gets better with rest and medicine.   Take medicine as told by your doctor.   Apply cold or heat to your back as told by your doctor.   Do not bend, lift, or sit much until your pain is better.   Do not do anything that makes the condition worse.   Take short periods of bed rest until you are back to normal.   Start normal activity when the pain is better.  GET HELP RIGHT AWAY IF:  There is more pain.   There is weakness or numbness in the legs.   You cannot control when you poop (bowel movement) or pee (urinate).  MAKE SURE YOU:  Understand these instructions.   Will watch this condition.   Will get help right away if you or your child is not doing well or gets worse.  Document Released: 03/18/2008 Document Re-Released: 09/03/2009 [facility] Patient Information 2011 [place], [place].

## 2010-11-13 NOTE — Assessment & Plan Note (Addendum)
He has chronic back pain likely due to lumbar disc protrusion. [place] of his back muscle did not show any mass. Asked him to get flexeril refill and also try to use heating/icy pad. He needs to f/u with pain Clinic if no improvement as steroid injection may be needed.

## 2010-12-04 ENCOUNTER — Encounter: Payer: Medicare Other | Admitting: Internal Medicine

## 2010-12-04 ENCOUNTER — Other Ambulatory Visit: Payer: Self-pay | Admitting: Cardiology

## 2010-12-06 ENCOUNTER — Encounter: Payer: Medicare Other | Attending: Physical Medicine & Rehabilitation

## 2010-12-06 ENCOUNTER — Ambulatory Visit: Payer: Medicare Other | Admitting: Physical Medicine & Rehabilitation

## 2010-12-06 DIAGNOSIS — M79609 Pain in unspecified limb: Secondary | ICD-10-CM | POA: Insufficient documentation

## 2010-12-06 DIAGNOSIS — E119 Type 2 diabetes mellitus without complications: Secondary | ICD-10-CM | POA: Insufficient documentation

## 2010-12-06 DIAGNOSIS — M546 Pain in thoracic spine: Secondary | ICD-10-CM

## 2010-12-06 DIAGNOSIS — I10 Essential (primary) hypertension: Secondary | ICD-10-CM | POA: Insufficient documentation

## 2010-12-06 DIAGNOSIS — Z79899 Other long term (current) drug therapy: Secondary | ICD-10-CM | POA: Insufficient documentation

## 2010-12-06 DIAGNOSIS — M4600 Spinal enthesopathy, site unspecified: Secondary | ICD-10-CM

## 2010-12-06 DIAGNOSIS — J45909 Unspecified asthma, uncomplicated: Secondary | ICD-10-CM | POA: Insufficient documentation

## 2010-12-09 ENCOUNTER — Other Ambulatory Visit: Payer: Self-pay | Admitting: *Deleted

## 2010-12-09 DIAGNOSIS — M25552 Pain in left hip: Secondary | ICD-10-CM

## 2010-12-09 MED ORDER — HYDROCODONE-ACETAMINOPHEN 7.5-750 MG PO TABS: 1.0000 | ORAL_TABLET | Freq: Four times a day (QID) | ORAL | Status: DC | PRN

## 2010-12-09 NOTE — Telephone Encounter (Signed)
Needs appointment for pain management, uds, treatment agreement-will give refill X 1 only-all additional refills need a visit.

## 2010-12-09 NOTE — Assessment & Plan Note (Signed)
REASON FOR VISIT:  Mid back pain.  A 60 year old male with mid back pain mainly in the right side.  He appeared to have a mass with tenderness over that area.  We ordered ultrasound of the thoracolumbar area Nov 08, 2010, which showed no abnormalities.  He states that his pain has improved.  He still is having some pain, but not every day, muscle relaxers do help these, remaining very active.  In fact, he will walk 2-1/2 hours 1 day, states he does not have a car.  His pain is worse with bending and standing, improves with rest.  He has no pain or weakness in lower extremities.  Blood pressure 162/93, pulse 102, respirations 18, O2 sat 98% on room air.  He does have a primary physician, is on hypertensive medications.  Follow up with Dr. Threasa Beards on this.  His back has minimal tenderness to palpation.  His lower extremity strength is normal.  His gait is normal.  IMPRESSION:  Mid back pain, myofascial.  I think he is overall doing quite well, very functional, taking p.r.n. muscle relaxants, although he states that he needs to take 2 or sometimes 3 Flexeril for it to be effective of the 5 mg dose.  PLAN: 1. We will increase his Flexeril to 10 mg dose up to t.i.d., but he is     going to take it more or less on a p.r.n. basis. 2. Continue gabapentin 100 mg t.i.d.  Follow up with me in 4 months.  I have also given him some knee hug-type exercises to be done on the floor, instruction.     Erick Colace, M.D. Electronically Signed    AEK/MedQ D:  12/06/2010 11:47:10  T:  12/07/2010 00:23:27  Job #:  [id number]  cc:   Jackson Latino, MD

## 2010-12-10 ENCOUNTER — Telehealth: Payer: Self-pay | Admitting: *Deleted

## 2010-12-10 NOTE — Telephone Encounter (Signed)
Call from pt concerning his refill.  Pt was informed that his refill has been completed and that he will need to come in for an appointment prior to getting further refills.  Pt was transferred to scheduling to make an appointment.  Call to pharmacy.  Pt will be unable to pick up the prescription until 12/19/2010 per Medicaid.

## 2010-12-11 NOTE — Telephone Encounter (Signed)
Called to pharm 6/19, i called to day and had pharm add a note to pt that he must be seen in order to obtain next refill

## 2011-01-06 ENCOUNTER — Emergency Department (HOSPITAL_COMMUNITY)
Admission: EM | Admit: 2011-01-06 | Discharge: 2011-01-07 | Disposition: A | Discharge status: Home / Self Care | Payer: Medicare Other | Attending: Emergency Medicine | Admitting: Emergency Medicine

## 2011-01-06 DIAGNOSIS — I10 Essential (primary) hypertension: Secondary | ICD-10-CM | POA: Insufficient documentation

## 2011-01-06 DIAGNOSIS — M129 Arthropathy, unspecified: Secondary | ICD-10-CM | POA: Insufficient documentation

## 2011-01-06 DIAGNOSIS — Z79899 Other long term (current) drug therapy: Secondary | ICD-10-CM | POA: Insufficient documentation

## 2011-01-06 DIAGNOSIS — E119 Type 2 diabetes mellitus without complications: Secondary | ICD-10-CM | POA: Insufficient documentation

## 2011-01-06 DIAGNOSIS — J438 Other emphysema: Secondary | ICD-10-CM | POA: Insufficient documentation

## 2011-01-06 LAB — GLUCOSE, CAPILLARY: Glucose-Capillary: 260 mg/dL — ABNORMAL HIGH (ref 70–99)

## 2011-01-07 LAB — GLUCOSE, CAPILLARY: Glucose-Capillary: 233 mg/dL — ABNORMAL HIGH (ref 70–99)

## 2011-01-07 LAB — POCT I-STAT, CHEM 8
BUN: 17 mg/dL (ref 6–23)
Calcium, Ion: 1.15 mmol/L (ref 1.12–1.32)
Chloride: 105 mEq/L (ref 96–112)
Creatinine, Ser: 1 mg/dL (ref 0.50–1.35)
Glucose, Bld: 222 mg/dL — ABNORMAL HIGH (ref 70–99)
HCT: 40 % (ref 39.0–52.0)
Hemoglobin: 13.6 g/dL (ref 13.0–17.0)
Potassium: 3.8 meq/L (ref 3.5–5.1)
Sodium: 139 meq/L (ref 135–145)
TCO2: 24 mmol/L (ref 0–100)

## 2011-01-08 ENCOUNTER — Other Ambulatory Visit: Payer: Self-pay | Admitting: Internal Medicine

## 2011-01-08 ENCOUNTER — Ambulatory Visit (INDEPENDENT_AMBULATORY_CARE_PROVIDER_SITE_OTHER): Payer: Medicare Other | Admitting: Internal Medicine

## 2011-01-08 ENCOUNTER — Encounter: Payer: Self-pay | Admitting: Internal Medicine

## 2011-01-08 VITALS — BP 121/82 | HR 88 | Temp 97.8°F | Ht 73.0 in | Wt 226.4 lb

## 2011-01-08 DIAGNOSIS — M5417 Radiculopathy, lumbosacral region: Secondary | ICD-10-CM

## 2011-01-08 DIAGNOSIS — J449 Chronic obstructive pulmonary disease, unspecified: Secondary | ICD-10-CM

## 2011-01-08 DIAGNOSIS — M25552 Pain in left hip: Secondary | ICD-10-CM

## 2011-01-08 DIAGNOSIS — E119 Type 2 diabetes mellitus without complications: Secondary | ICD-10-CM

## 2011-01-08 DIAGNOSIS — M25559 Pain in unspecified hip: Secondary | ICD-10-CM

## 2011-01-08 DIAGNOSIS — E785 Hyperlipidemia, unspecified: Secondary | ICD-10-CM

## 2011-01-08 DIAGNOSIS — F439 Reaction to severe stress, unspecified: Secondary | ICD-10-CM

## 2011-01-08 DIAGNOSIS — M1990 Unspecified osteoarthritis, unspecified site: Secondary | ICD-10-CM

## 2011-01-08 DIAGNOSIS — I10 Essential (primary) hypertension: Secondary | ICD-10-CM

## 2011-01-08 DIAGNOSIS — Z599 Problem related to housing and economic circumstances, unspecified: Secondary | ICD-10-CM

## 2011-01-08 MED ORDER — ALBUTEROL 90 MCG/ACT IN AERS: 2.0000 | INHALATION_SPRAY | RESPIRATORY_TRACT | Status: DC | PRN

## 2011-01-08 MED ORDER — FLUTICASONE-SALMETEROL 250-50 MCG/DOSE IN AEPB: 1.0000 | INHALATION_SPRAY | Freq: Two times a day (BID) | RESPIRATORY_TRACT | Status: DC

## 2011-01-08 MED ORDER — ASPIRIN 81 MG PO TBEC: 81.0000 mg | DELAYED_RELEASE_TABLET | Freq: Every day | ORAL | Status: AC

## 2011-01-08 MED ORDER — SIMVASTATIN 40 MG PO TABS: 40.0000 mg | ORAL_TABLET | Freq: Every day | ORAL | Status: DC

## 2011-01-08 MED ORDER — HYDROCODONE-ACETAMINOPHEN 7.5-750 MG PO TABS: 1.0000 | ORAL_TABLET | Freq: Four times a day (QID) | ORAL | Status: DC | PRN

## 2011-01-08 MED ORDER — METFORMIN HCL 500 MG PO TABS: 1000.0000 mg | ORAL_TABLET | Freq: Two times a day (BID) | ORAL | Status: DC

## 2011-01-08 MED ORDER — GLIPIZIDE 5 MG PO TABS: 5.0000 mg | ORAL_TABLET | Freq: Every day | ORAL | Status: DC

## 2011-01-08 MED ORDER — DILTIAZEM HCL ER BEADS 240 MG PO CP24: 240.0000 mg | ORAL_CAPSULE | Freq: Every day | ORAL | Status: DC

## 2011-01-08 MED ORDER — TIOTROPIUM BROMIDE MONOHYDRATE 18 MCG IN CAPS: 18.0000 ug | ORAL_CAPSULE | Freq: Every day | RESPIRATORY_TRACT | Status: DC

## 2011-01-08 MED ORDER — LISINOPRIL 2.5 MG PO TABS: 2.5000 mg | ORAL_TABLET | Freq: Every day | ORAL | Status: DC

## 2011-01-08 NOTE — Assessment & Plan Note (Signed)
Chronic osteoarthritis bilaterally, R>L, considering knee replacement surgery if condition worsens, followed by an orthopedic physician -continue vicodin 7.5-750 mg q6hrs prn -pain contract signed at this visit

## 2011-01-08 NOTE — Assessment & Plan Note (Signed)
Patient with ED visit 2 days ago for ?hyperglycemia (CBG = 260), currently asymptomatic -increase patient's metformin from 500 mg BID to 1000 mg BID -re-check Hb A1C at next visit -patient encouraged to bring his glucometer to his next appointment -referral to [facility], at patient's request -Social Work consult to deal with the apartment-related stress he believes is causing his elevated CBG's

## 2011-01-08 NOTE — Assessment & Plan Note (Signed)
Patient notes resolution of his back pain since his visit to the pain clinic. -currently pain-free -will d/c flexeril, as patient is not taking this medication and is pain-free

## 2011-01-08 NOTE — Patient Instructions (Signed)
We have increased your Metformin medication to 1000 mg, twice per day.  Please continue taking your other medications as prescribed.  Please return for a follow-up visit in 1 month, since we have changed one of your diabetes medications. -please bring your glucometer to that visit.

## 2011-01-08 NOTE — Progress Notes (Signed)
Follow-up Visit  HPI The patient is a 60 yo man with a history of DM, HTN, HL, and osteoarthritis of the knees, presenting for a follow-up visit.  The patient notes an increase in his daily blood glucose levels last week, with values in the high 200's-low 300's, rather than his usual range of low 100's.  He presented to the ED 2 days ago with nausea and "shaking", and was found to have a CBG of 260.  His metformin was increased from 500 mg BID to 1000 mg BID, and he was discharged.  Since that time, he reports no symptoms.  He notes full compliance with his medications, and no change in his diet/exercise, but thinks his levels have been higher due to stress concerning his new apartment.  He currently notes no polyuria, polydipsia, blurry vision, or change in mental status.  The patient also notes a history of knee osteoarthritis, R>L.  He manages his pain with vicodin 7.5-750 mg, which allows him to walk around town, 1-2 miles/day.  Tramadol, tylenol, ibuprofin, and bengay cream have not helped in the past.  He is followed by an orthopedic surgeon, who has discussed knee replacement with the patient, but they have decided to wait until he has more functional impairment before pursuing surgery.  He would like a refill of his vicodin today.  ROS: General: no fevers, chills, changes in weight, changes in appetite Skin: no rash HEENT: no blurry vision, hearing changes, sore throat Pulm: no dyspnea, coughing, wheezing CV: no chest pain, palpitations, shortness of breath Abd: no abdominal pain, nausea/vomiting, diarrhea/constipation Ext: see HPI Neuro: no weakness, numbness, or tingling  Filed Vitals:   01/08/11 0957  BP: 121/82  Pulse: 88  Temp: 97.8 F (36.6 C)    PEX General: alert, cooperative, and in no apparent distress HEENT: pupils equal round and reactive to light, vision grossly intact, oropharynx clear and non-erythematous  Neck: supple, no lymphadenopathy, JVD, or carotid  bruits Lungs: clear to ascultation bilaterally, normal work of respiration, no wheezes, rales, ronchi Heart: regular rate and rhythm, no murmurs, gallops, or rubs Abdomen: soft, non-tender, non-distended, normal bowel sounds Msk: Right and Left knees with moderate pain upon palpation of joint, full ROM, pain with load bearing, +crepitus noted. Extremities: 2+ DP/PT pulses bilaterally, no cyanosis, clubbing, or edema Neurologic: alert & oriented X3, cranial nerves II-XII intact, strength grossly intact, sensation intact to light touch  Assessment/Plan

## 2011-01-29 ENCOUNTER — Encounter: Payer: Medicare Other | Admitting: Internal Medicine

## 2011-01-29 ENCOUNTER — Other Ambulatory Visit: Payer: Self-pay | Admitting: *Deleted

## 2011-01-29 MED ORDER — GLUCOSE BLOOD VI STRP: 1.0000 | ORAL_STRIP | Freq: Every day | Status: DC

## 2011-01-29 MED ORDER — ACCU-CHEK AVIVA PLUS W/DEVICE KIT: 1.0000 | PACK | Freq: Every day | Status: DC

## 2011-01-29 NOTE — Telephone Encounter (Signed)
Per [facility], pt needs new rx for Accu-Chek Aviva meter.

## 2011-02-04 ENCOUNTER — Telehealth: Payer: Self-pay | Admitting: Licensed Clinical Social Worker

## 2011-02-04 NOTE — Telephone Encounter (Signed)
PCP said that patient is experiencing stress related to his new apartment.  Left message to assist.

## 2011-02-05 ENCOUNTER — Ambulatory Visit: Payer: Medicare Other | Admitting: Dietician

## 2011-02-05 ENCOUNTER — Encounter: Payer: Medicare Other | Admitting: Internal Medicine

## 2011-02-05 ENCOUNTER — Emergency Department (HOSPITAL_COMMUNITY)
Admission: EM | Admit: 2011-02-05 | Discharge: 2011-02-05 | Disposition: A | Discharge status: Home / Self Care | Payer: Medicare Other | Attending: Emergency Medicine | Admitting: Emergency Medicine

## 2011-02-05 ENCOUNTER — Telehealth: Payer: Self-pay | Admitting: Licensed Clinical Social Worker

## 2011-02-05 ENCOUNTER — Emergency Department (HOSPITAL_COMMUNITY): Payer: Medicare Other

## 2011-02-05 DIAGNOSIS — R109 Unspecified abdominal pain: Secondary | ICD-10-CM | POA: Insufficient documentation

## 2011-02-05 DIAGNOSIS — M5137 Other intervertebral disc degeneration, lumbosacral region: Secondary | ICD-10-CM | POA: Insufficient documentation

## 2011-02-05 DIAGNOSIS — M51379 Other intervertebral disc degeneration, lumbosacral region without mention of lumbar back pain or lower extremity pain: Secondary | ICD-10-CM | POA: Insufficient documentation

## 2011-02-05 DIAGNOSIS — M549 Dorsalgia, unspecified: Secondary | ICD-10-CM | POA: Insufficient documentation

## 2011-02-05 DIAGNOSIS — E119 Type 2 diabetes mellitus without complications: Secondary | ICD-10-CM | POA: Insufficient documentation

## 2011-02-05 DIAGNOSIS — I10 Essential (primary) hypertension: Secondary | ICD-10-CM | POA: Insufficient documentation

## 2011-02-05 DIAGNOSIS — I498 Other specified cardiac arrhythmias: Secondary | ICD-10-CM | POA: Insufficient documentation

## 2011-02-05 DIAGNOSIS — K5730 Diverticulosis of large intestine without perforation or abscess without bleeding: Secondary | ICD-10-CM | POA: Insufficient documentation

## 2011-02-05 LAB — URINALYSIS, ROUTINE W REFLEX MICROSCOPIC
Nitrite: NEGATIVE
Specific Gravity, Urine: 1.013 (ref 1.005–1.030)
Urobilinogen, UA: 1 mg/dL (ref 0.0–1.0)
pH: 7.5 (ref 5.0–8.0)

## 2011-02-05 LAB — BASIC METABOLIC PANEL
BUN: 11 mg/dL (ref 6–23)
CO2: 26 mEq/L (ref 19–32)
Calcium: 10.1 mg/dL (ref 8.4–10.5)
GFR calc non Af Amer: >60 mL/min (ref >60)
Glucose, Bld: 304 mg/dL — ABNORMAL HIGH (ref 70–99)
Potassium: 4.4 mEq/L (ref 3.5–5.1)
Sodium: 138 mEq/L (ref 135–145)

## 2011-02-05 LAB — DIFFERENTIAL
Basophils Relative: 0 % (ref 0–1)
Lymphocytes Relative: 16 % (ref 12–46)
Monocytes Absolute: 0.2 10*3/uL (ref 0.1–1.0)
Monocytes Relative: 2 % — ABNORMAL LOW (ref 3–12)
Neutro Abs: 6.7 10*3/uL (ref 1.7–7.7)
Neutrophils Relative %: 81 % — ABNORMAL HIGH (ref 43–77)

## 2011-02-05 LAB — CBC
HCT: 40 % (ref 39.0–52.0)
Hemoglobin: 13.5 g/dL (ref 13.0–17.0)
MCH: 31 pg (ref 26.0–34.0)
MCHC: 33.8 g/dL (ref 30.0–36.0)
RBC: 4.36 MIL/uL (ref 4.22–5.81)

## 2011-02-05 LAB — APTT: aPTT: 34 seconds (ref 24–37)

## 2011-02-05 LAB — PROTIME-INR: INR: 0.98 (ref 0.00–1.49)

## 2011-02-05 LAB — URINE MICROSCOPIC-ADD ON

## 2011-02-05 NOTE — Telephone Encounter (Signed)
[name] returned my call.  Apparently he is having issues with his water at his apartment complex in that it is heavily chlorinated.  He suspects that the chlorine is coming from the pool and leaking into the water system. He thinks that it is causing his sugars to go up and has now resorted to bringing in bottled water which he has to go across town to obtain.   I've asked patient to bring in address and contact information and I will write a letter on his behalf. Patient said that other people have moved out of the complex because of this issue.  His housing is Section 8 housing.

## 2011-02-09 ENCOUNTER — Other Ambulatory Visit: Payer: Self-pay | Admitting: Internal Medicine

## 2011-02-10 ENCOUNTER — Telehealth: Payer: Self-pay | Admitting: *Deleted

## 2011-02-10 NOTE — Telephone Encounter (Signed)
Pt called and left message for refill on pain med, i called the pharm and was told he rec'd #15 of 5mg  oxycodone 8/15 from dr Patria Mane at [facility] ED, pt has refills on hydrocodone and pharm is instructed to fill when appropriate.

## 2011-02-12 NOTE — Telephone Encounter (Signed)
Thank you for taking care of this matter.

## 2011-02-25 ENCOUNTER — Encounter: Payer: Medicare Other | Admitting: Internal Medicine

## 2011-03-24 LAB — BASIC METABOLIC PANEL
BUN: 7
Calcium: 9.4
Creatinine, Ser: 0.95
GFR calc Af Amer: >60
GFR calc non Af Amer: >60

## 2011-03-24 LAB — POCT I-STAT, CHEM 8
BUN: 8
Chloride: 103
Creatinine, Ser: 1.1
Hemoglobin: 13.9
Potassium: 3.6
Sodium: 142

## 2011-03-24 LAB — COMPREHENSIVE METABOLIC PANEL
ALT: 15
Alkaline Phosphatase: 67
Alkaline Phosphatase: 84
BUN: 10
CO2: 27
CO2: 27
Chloride: 105
Creatinine, Ser: 0.93
GFR calc non Af Amer: >60
GFR calc non Af Amer: >60
Glucose, Bld: 120 — ABNORMAL HIGH
Glucose, Bld: 206 — ABNORMAL HIGH
Potassium: 3.2 — ABNORMAL LOW
Sodium: 139
Total Bilirubin: 0.8
Total Protein: 6.9

## 2011-03-24 LAB — POCT CARDIAC MARKERS
CKMB, poc: 2.6
Troponin i, poc: <0.05
Troponin i, poc: <0.05

## 2011-03-24 LAB — CARDIAC PANEL(CRET KIN+CKTOT+MB+TROPI)
CK, MB: 2.1
CK, MB: 2.1
Relative Index: 0.6
Relative Index: 0.7
Total CK: 299 — ABNORMAL HIGH
Troponin I: 0.01

## 2011-03-24 LAB — LIPID PANEL
Cholesterol: 173
HDL: 27 — ABNORMAL LOW
Triglycerides: 153 — ABNORMAL HIGH

## 2011-03-24 LAB — MAGNESIUM
Magnesium: 2.3
Magnesium: 2.4

## 2011-03-24 LAB — DIFFERENTIAL
Eosinophils Absolute: 0.1
Lymphocytes Relative: 38
Lymphs Abs: 2.8
Neutro Abs: 3.7
Neutrophils Relative %: 51

## 2011-03-24 LAB — CBC
HCT: 43.2
Hemoglobin: 12.7 — ABNORMAL LOW
Hemoglobin: 15
MCV: 98.6
Platelets: 281
Platelets: 301
RBC: 3.76 — ABNORMAL LOW
RBC: 4.16 — ABNORMAL LOW
WBC: 7.4
WBC: 8

## 2011-03-24 LAB — RAPID URINE DRUG SCREEN, HOSP PERFORMED: Cocaine: NOT DETECTED

## 2011-03-24 LAB — B-NATRIURETIC PEPTIDE (CONVERTED LAB): Pro B Natriuretic peptide (BNP): <30

## 2011-04-04 ENCOUNTER — Ambulatory Visit: Payer: Medicare Other | Admitting: Physical Medicine & Rehabilitation

## 2011-04-04 ENCOUNTER — Encounter: Payer: Medicare Other | Attending: Physical Medicine & Rehabilitation

## 2011-04-04 NOTE — Assessment & Plan Note (Signed)
REASON FOR VISIT:  Left leg numbness.  HISTORY:  A 60 year old male who I last saw in clinic on December 06, 2010. He has a history of mainly myofascial pain.  He had an episode what sounded to be some sciatic type discomfort.  He went to the emergency room and had some type of IV medications.  He states that since that time his pain is gotten better, he does still have a little bit of numbness in the left side.  He had a previous MRI lumbar spine dated July 15, 2010 showing some left lateral recess protrusion L5-S1, moderate narrowing L4-5 encroaching upon the exiting left L4 nerve root.  He has 0 pain now.  He does his Williams type exercises.  SOCIAL:  Lives alone, nonsmoker, nondrinker, walks several blocks a day.  OBJECTIVE:  VITAL SIGNS:  Blood pressure 137/64, pulse 89, respirations 16, and O2 sat 98% on room air. GENERAL:  No acute distress.  Mood and affect appropriate. MUSCULOSKELETAL:  His straight leg raising test is negative.  Lower extremity strength is normal.  He is able toe walk, heel walk.  His deep tendon reflexes normal in bilateral lower extremities.  He does have some decreased sensation in left L4 dermatomal distribution.  IMPRESSION:  History of lumbar foraminal stenosis L4-5 with some L4 chronic radiculitis, really do not think we need to do anything about this, it was explained well by his most recent MRI in January.  I will see him back on a p.r.n. basis.  Should he have another flare-up of his pain certainly I can re-evaluate him to see if he needs an epidural injection.  Discussed with the patient, agrees with plan.  He is no longer taking his Flexeril and gabapentin.     Erick Colace, M.D. Electronically Signed    AEK/MedQ D:  04/04/2011 11:56:44  T:  04/04/2011 12:58:35  Job #:  [id number]

## 2011-04-08 ENCOUNTER — Ambulatory Visit: Payer: Medicare Other | Admitting: Physical Medicine & Rehabilitation

## 2011-04-09 ENCOUNTER — Encounter: Payer: Self-pay | Admitting: Internal Medicine

## 2011-04-09 ENCOUNTER — Ambulatory Visit (INDEPENDENT_AMBULATORY_CARE_PROVIDER_SITE_OTHER): Payer: Medicare Other | Admitting: Internal Medicine

## 2011-04-09 VITALS — BP 128/82 | HR 97 | Temp 97.3°F | Ht 73.0 in | Wt 223.7 lb

## 2011-04-09 DIAGNOSIS — IMO0002 Reserved for concepts with insufficient information to code with codable children: Secondary | ICD-10-CM

## 2011-04-09 DIAGNOSIS — Z23 Encounter for immunization: Secondary | ICD-10-CM

## 2011-04-09 DIAGNOSIS — Z0000 Encounter for general adult medical examination without abnormal findings: Secondary | ICD-10-CM

## 2011-04-09 DIAGNOSIS — J449 Chronic obstructive pulmonary disease, unspecified: Secondary | ICD-10-CM

## 2011-04-09 DIAGNOSIS — E119 Type 2 diabetes mellitus without complications: Secondary | ICD-10-CM

## 2011-04-09 DIAGNOSIS — M5417 Radiculopathy, lumbosacral region: Secondary | ICD-10-CM

## 2011-04-09 DIAGNOSIS — I10 Essential (primary) hypertension: Secondary | ICD-10-CM

## 2011-04-09 LAB — POCT GLYCOSYLATED HEMOGLOBIN (HGB A1C): Hemoglobin A1C: 7.7

## 2011-04-09 LAB — GLUCOSE, CAPILLARY: Glucose-Capillary: 49 mg/dL — ABNORMAL LOW (ref 70–99)

## 2011-04-09 NOTE — Assessment & Plan Note (Signed)
Upon BP re-check, his BP was 128/82, representing good control -continue current regimen

## 2011-04-09 NOTE — Assessment & Plan Note (Signed)
-  flu shot given today -tdap given today 

## 2011-04-09 NOTE — Patient Instructions (Signed)
Your diabetes and blood pressure are well-controlled, keep up the good work! -continue to try to make good dietary choices, and exercise daily to further lower your Hemoglobin A1C (a marker for diabetes)  Please return for a follow-up visit in 3 months.

## 2011-04-09 NOTE — Assessment & Plan Note (Signed)
Well managed, continue current regimen 

## 2011-04-09 NOTE — Progress Notes (Signed)
HPI The patient is a 60 yo man, history of DM, HL, HTN, COPD, and OA, presenting for a routine follow-up appointment, and for an ED follow-up.  The patient was recently seen in the ED 02/05/11 for an acute episode of severe lower back pain, radiating to the left leg, associated with some left leg numbness, likely representing a herniated disk vs spasm.  The patient was treated with flexeril and oxycodone, and his pain slowly improved, and he now reports that he is back to his baseline.  The patient continues to note chronic low back, hip, and knee pain, currently managed with vicodin, and managed at the Pain Clinic.  The patient has been keeping a paper log of his fasting CBG's, which have been typically in the 100's, with only a few rare values in the 200's.  He notes no polyuria, polydipsia, blurry vision, or alteration of mental status.  Of note, the patient came fasting to his appointment today, and his CBG in clinic is 49.  He notes some shakiness, but no alteration of consciousness.  The patient has a history of COPD, and has been using his inhalers regularly, and currently notes no dyspnea, or SOB.  ROS: General: no fevers, chills, changes in weight, changes in appetite Skin: no rash HEENT: no blurry vision, hearing changes, sore throat Pulm: no dyspnea, coughing, wheezing CV: no chest pain, palpitations, shortness of breath Abd: no abdominal pain, nausea/vomiting, diarrhea/constipation GU: no dysuria, hematuria, polyuria Ext: see HPI Neuro: no weakness, numbness, or tingling  Filed Vitals:   04/09/11 1510  BP: 145/83  Pulse: 97  Temp: 97.3 F (36.3 C)    PEX General: alert, cooperative, and in no apparent distress HEENT: pupils equal round and reactive to light, vision grossly intact, oropharynx clear and non-erythematous  Neck: supple, no lymphadenopathy, JVD Lungs: clear to ascultation bilaterally, normal work of respiration, no wheezes, rales, ronchi Heart: regular rate and  rhythm, no murmurs, gallops, or rubs Abdomen: soft, non-tender, non-distended, normal bowel sounds Msk: no joint edema, warmth, or erythema Extremities: 2+ DP/PT pulses bilaterally, no cyanosis, clubbing, or edema Neurologic: alert & oriented X3, cranial nerves II-XII intact, strength grossly intact, sensation intact to light touch  Assessment/Plan

## 2011-04-09 NOTE — Assessment & Plan Note (Addendum)
Diabetes well-controlled with Metformin and Glipizide.  Of note, Hb A1C has increased from 6.9 at last visit, to 7.7 today, however this still represents relatively good control of diabetes.  Also of note, he presented with a CBG of 49 after fasting all day, but has no episodes of hypoglycemia noted in his log book of home CBG values. -this patient's diabetes is reasonably well-controlled.  If A1C continues to increase, we may need to consider more intensive treatment at future visits.  At this point, however, we will keep him on his PO regimen. -checking urine microalbumin today

## 2011-04-09 NOTE — Assessment & Plan Note (Signed)
Patient reports an episode of what appeared to be a disk herniation vs lower back spasm, which he reports has completely resolved -patient continues to report baseline low back, hip, and knee pain, managed with vicodin

## 2011-04-10 NOTE — Progress Notes (Signed)
agree with plans annd note.

## 2011-04-11 ENCOUNTER — Ambulatory Visit (INDEPENDENT_AMBULATORY_CARE_PROVIDER_SITE_OTHER): Payer: Medicare Other | Admitting: Dietician

## 2011-04-11 DIAGNOSIS — E119 Type 2 diabetes mellitus without complications: Secondary | ICD-10-CM

## 2011-04-11 NOTE — Patient Instructions (Signed)
You said you want to work on eating 3 meals a day- not skipping lunch   Also eating healthier snacks and lunch foods- peanut butter and tuna and fruits and vegtable

## 2011-04-11 NOTE — Progress Notes (Signed)
Medical Nutrition Therapy:  Appt start time: 0930 end time:  1015.  Assessment:  Primary concerns today: Blood sugar control, Meal planning and Annual Review  Usual eating pattern includes Meal 2 and 2+ snacks per day.      Avoided foods include: fried foods.     Usual physical activity includes walking 30 minutes a day 6 days per week  Diagnosis and Intervention:  Progress Towards Goal(s):  In progress   Nutritional Diagnosis:  NB-1.1 Food and nutrition-related knowledge deficit As related to lack of prior exposure to foods containing carbs.  As evidenced by his report and not knowing that milk has carbs. NI-5.6.3 Inappropriate intake of fats (specify): his reported intake of saturated fat sources As related to risk of heart disease.  As evidenced by his food recall.  Interventions: 1- Educated about carb content of foods 2- Educated about healthy fats and foods he can eat to replace unhealthy fats. 3- Social support and encouragement provided. 4- Reviewed laboratory data incuding values in target and values that need improvement.     Monitoring/Evaluation:  Dietary intake and meter download in 6 week(s)

## 2011-05-20 ENCOUNTER — Ambulatory Visit (INDEPENDENT_AMBULATORY_CARE_PROVIDER_SITE_OTHER): Payer: Medicare Other | Admitting: Dietician

## 2011-05-20 DIAGNOSIS — E119 Type 2 diabetes mellitus without complications: Secondary | ICD-10-CM

## 2011-05-20 NOTE — Progress Notes (Signed)
Medical Nutrition Therapy Monitoring and Evalutation:  Appt start time: 0930 end time:  1015.  Assessment:  Primary concerns today: Blood sugar control, Meal planning and Annual Review  Patient states desired weight loss to 195- 200 #. Weight increased ~ 5 #- some may be due to 3 layers of clothing, boots. Usual eating pattern includes 2 meal and 2+ snacks per day.   Now carries something with him for lunch most days. Interested in increasing his acitivity. His  physical activity includes walking 30 minutes a day 2-3 days per week now that colder. Feels his blood sugars are higher as a result. Blood sugar reviewed and seem to be higher in November(average is 139) than October(average is .   Diagnosis and Intervention:  Progress Towards Goal(s):  In progress   Nutritional Diagnosis:  NB-1.1 Food and nutrition-related knowledge deficit As related to lack of prior exposure to foods containing carbs improved somewhat as evidenced by patient being able to identify most food sources of starches and sugars. NI-5.6.3 Inappropriate intake of saturated fat sources As related to risk of heart disease improving  As evidenced by his food recall.  Interventions: 1- Reviewed food sources of carbs, the importance of eating a moderate amount at each meal and their effect on blood sugar control. 2- Educated about healthy fats and foods he can eat to replace unhealthy fats within his budget. 3- Social support and encouragement provided. 4- Educated/corrected misconception about target numbers for blood sugars.( patient thought 88 was too low and  was trying to keep his blood sugar > 120.)   Monitoring/Evaluation:  Dietary intake and meter download in 8-12 week(s)

## 2011-05-20 NOTE — Patient Instructions (Addendum)
Good Job eating something around lunchtime each day.  Eat less fat by eating less spam, butter, and oil. Eating healthier fats like fish and nuts.  Eat less salt by eating less spam, less canned goods, more frozen vegetables id you can.  For activity you said you were going to think about going to [facility] or one of the [place] [facility] centers.

## 2011-06-10 ENCOUNTER — Other Ambulatory Visit: Payer: Self-pay | Admitting: *Deleted

## 2011-06-10 DIAGNOSIS — M1990 Unspecified osteoarthritis, unspecified site: Secondary | ICD-10-CM

## 2011-06-10 DIAGNOSIS — M25552 Pain in left hip: Secondary | ICD-10-CM

## 2011-06-11 NOTE — Telephone Encounter (Signed)
Called pharmacy to refill this med; pharmacy said they realized he still had 1 refill left on the previous prescription, and the patient picked it up yesterday.  So I will now delete this request.

## 2011-06-20 ENCOUNTER — Other Ambulatory Visit: Payer: Self-pay | Admitting: *Deleted

## 2011-06-20 MED ORDER — GLIPIZIDE 5 MG PO TABS: 5.0000 mg | ORAL_TABLET | Freq: Every day | ORAL | Status: DC

## 2011-06-30 ENCOUNTER — Other Ambulatory Visit: Payer: Self-pay | Admitting: Internal Medicine

## 2011-06-30 ENCOUNTER — Telehealth: Payer: Self-pay | Admitting: *Deleted

## 2011-06-30 DIAGNOSIS — E119 Type 2 diabetes mellitus without complications: Secondary | ICD-10-CM

## 2011-06-30 MED ORDER — METFORMIN HCL 500 MG PO TABS: 1000.0000 mg | ORAL_TABLET | Freq: Two times a day (BID) | ORAL | Status: DC

## 2011-06-30 NOTE — Telephone Encounter (Signed)
Could you please clarify the metformin script, is it 500mg  2 tabs twice daily which would = 120/mon or is it 2 tabs daily which would = 60/mon. Please advise

## 2011-06-30 NOTE — Telephone Encounter (Signed)
Thank you for this note.  The patient should be taking 1000 mg BID, so that would be 2 tablets, twice per day.  I must have made a mistake on the quantity on the prescription in July.  I'll re-send a new, corrected prescription to his pharmacy.  Thanks, [employer]

## 2011-07-11 ENCOUNTER — Other Ambulatory Visit: Payer: Self-pay | Admitting: *Deleted

## 2011-07-11 DIAGNOSIS — M25552 Pain in left hip: Secondary | ICD-10-CM

## 2011-07-11 DIAGNOSIS — M1990 Unspecified osteoarthritis, unspecified site: Secondary | ICD-10-CM

## 2011-07-11 MED ORDER — HYDROCODONE-ACETAMINOPHEN 7.5-750 MG PO TABS: 1.0000 | ORAL_TABLET | Freq: Four times a day (QID) | ORAL | Status: DC | PRN

## 2011-07-11 NOTE — Telephone Encounter (Signed)
Pt # [phone]

## 2011-07-11 NOTE — Telephone Encounter (Signed)
Rx called in to [facility]. 

## 2011-07-20 ENCOUNTER — Other Ambulatory Visit: Payer: Self-pay | Admitting: Internal Medicine

## 2011-07-21 NOTE — Telephone Encounter (Signed)
Pls make appt with Dr Manson Passey - he has openings in March - routine F/U appt

## 2011-08-18 ENCOUNTER — Other Ambulatory Visit: Payer: Self-pay | Admitting: Physical Medicine & Rehabilitation

## 2011-09-03 ENCOUNTER — Encounter: Payer: Medicare Other | Admitting: Internal Medicine

## 2011-09-17 ENCOUNTER — Encounter: Payer: Self-pay | Admitting: Internal Medicine

## 2011-09-17 ENCOUNTER — Ambulatory Visit (INDEPENDENT_AMBULATORY_CARE_PROVIDER_SITE_OTHER): Payer: Medicare Other | Admitting: Internal Medicine

## 2011-09-17 VITALS — BP 141/83 | HR 97 | Temp 97.4°F | Ht 73.0 in | Wt 215.9 lb

## 2011-09-17 DIAGNOSIS — E785 Hyperlipidemia, unspecified: Secondary | ICD-10-CM

## 2011-09-17 DIAGNOSIS — E119 Type 2 diabetes mellitus without complications: Secondary | ICD-10-CM

## 2011-09-17 DIAGNOSIS — G252 Other specified forms of tremor: Secondary | ICD-10-CM

## 2011-09-17 DIAGNOSIS — G250 Essential tremor: Secondary | ICD-10-CM

## 2011-09-17 DIAGNOSIS — I10 Essential (primary) hypertension: Secondary | ICD-10-CM

## 2011-09-17 LAB — LIPID PANEL
HDL: 38 mg/dL — ABNORMAL LOW (ref >39)
Total CHOL/HDL Ratio: 3.2 Ratio
VLDL: 33 mg/dL (ref 0–40)

## 2011-09-17 LAB — POCT GLYCOSYLATED HEMOGLOBIN (HGB A1C): Hemoglobin A1C: 10.9

## 2011-09-17 LAB — GLUCOSE, CAPILLARY: Glucose-Capillary: 233 mg/dL — ABNORMAL HIGH (ref 70–99)

## 2011-09-17 MED ORDER — PROPRANOLOL HCL 40 MG PO TABS: 40.0000 mg | ORAL_TABLET | Freq: Two times a day (BID) | ORAL | Status: DC

## 2011-09-17 MED ORDER — GLIPIZIDE 10 MG PO TABS: 10.0000 mg | ORAL_TABLET | Freq: Every day | ORAL | Status: DC

## 2011-09-17 NOTE — Progress Notes (Signed)
HPI The patient is a 61 yo man, history of DM, HTN, HL, COPD, presenting for a routine follow-up visit.  The patient's Hb A1C has increased today from 7.7 to 10.9.  The patient notes no changes in his diet, but notes that he has been exercising less due to the cold weather, and that he has had more family stress than usual lately.  He is currently managed with metformin 1000 mg BID and glipizide 5 mg BID.  Of note, the patient's Hb A1C values over the last several years have been around 6-7, with about 1 outlying high value every couple of years.  He notes no blurry vision or alteration of consciousness.  The patient also notes a bilateral hand tremor, present since birth, worsening with age, worse when holding an object, which resolves after a drink of alcohol.  He notes that his parents had a similar tremor in old age.   ROS: General: no fevers, chills, changes in weight, changes in appetite Skin: no rash HEENT: no blurry vision, hearing changes, sore throat Pulm: no dyspnea, coughing, wheezing CV: no chest pain, palpitations, shortness of breath Abd: no abdominal pain, nausea/vomiting, diarrhea/constipation GU: no dysuria, hematuria, polyuria Ext: no arthralgias, myalgias Neuro: no weakness, numbness, or tingling  Filed Vitals:   09/17/11 1535  BP: 141/83  Pulse: 97  Temp: 97.4 F (36.3 C)   PEX General: alert, cooperative, and in no apparent distress HEENT: pupils equal round and reactive to light, vision grossly intact, oropharynx clear and non-erythematous  Neck: supple, no lymphadenopathy Lungs: clear to ascultation bilaterally, normal work of respiration, no wheezes, rales, ronchi Heart: regular rate and rhythm, no murmurs, gallops, or rubs Abdomen: soft, non-tender, non-distended, normal bowel sounds Msk: bilateral hands with fine tremor when reaching for object or holding object.  Tone normal in arms bilaterally with no cogwheeling or rigidity. Extremities: 2+ DP/PT pulses  bilaterally, no cyanosis, clubbing, or edema Neurologic: alert & oriented X3, cranial nerves II-XII intact, strength grossly intact, sensation intact to light touch  Assessment/Plan

## 2011-09-17 NOTE — Patient Instructions (Signed)
Your hemoglobin A1C today is 10.9, which is higher than usual. -We are increasing your Glipizide medication from 5 mg daily, to 10 mg daily -Diet and exercise can help you lower your blood sugars to a healthy level  Your hand shaking is due to a condition called Essential Tremor, which is a harmless condition. -We are prescribing Propranolol, 1 tablet twice per day  Please return for a follow-up visit in 3 months

## 2011-09-18 DIAGNOSIS — G250 Essential tremor: Secondary | ICD-10-CM | POA: Insufficient documentation

## 2011-09-18 NOTE — Assessment & Plan Note (Signed)
The patient's A1C is elevated today, after being well-controlled for the last 2 years.  May represent true worsening of disease, or may represent decreased exercise recently.  Patient is very fearful of starting insulin.  Currently on metformin 1000 BID and glipizide 5. -increase glipizide to 10, keep metformin -patient appears motivated to make diet/exercise changes -recheck A1C in 3 months, if still elevated, consider adding pioglitazone vs [place]

## 2011-09-18 NOTE — Assessment & Plan Note (Signed)
BP well-controlled; may improve with propranolol for tremor

## 2011-09-18 NOTE — Assessment & Plan Note (Signed)
Checking lipid panel today. 

## 2011-09-18 NOTE — Assessment & Plan Note (Signed)
The patient's symptoms appear consistent with essential tremor.  It is now to the point where it is bothering the patient. -start propranolol at low dose, may increase if no effect -reassess at next visit

## 2011-10-17 ENCOUNTER — Telehealth: Payer: Self-pay | Admitting: *Deleted

## 2011-10-17 NOTE — Telephone Encounter (Signed)
Spoke with the patient today regarding his essential tremor.  He notes that the Propranolol 40 mg BID has not improved his symptoms, though it has not made his symptoms any worse.  Patient notes no symptoms of lightheadedness, weakness, fatigue, or dizziness when standing.    I advised the patient to increase his propranolol to 60 mg BID (1.5 tablets).  Based on the patient's BP at last clinic visit of 140's/80's, patient should have no trouble tolerating this increased dose.  If he still notes no improvement in symptoms, the patient can present to clinic for an appointment, and if BP is stable, can consider increasing propranolol further for symptomatic relief of essential tremor.  The patient expressed understanding.

## 2011-10-17 NOTE — Telephone Encounter (Signed)
Pt called to let you know the Propranolol, 1 tablet twice per day is NOT helping his tremors.  It seems to make them worse.   She wants to know if he should stop taking it and try something else.  Please advise Pt # [phone]

## 2011-10-19 ENCOUNTER — Other Ambulatory Visit: Payer: Self-pay | Admitting: Internal Medicine

## 2011-10-20 ENCOUNTER — Other Ambulatory Visit: Payer: Self-pay | Admitting: Internal Medicine

## 2011-10-20 DIAGNOSIS — E119 Type 2 diabetes mellitus without complications: Secondary | ICD-10-CM

## 2011-10-20 MED ORDER — GLIPIZIDE 10 MG PO TABS: 10.0000 mg | ORAL_TABLET | Freq: Every day | ORAL | Status: DC

## 2011-10-22 ENCOUNTER — Telehealth: Payer: Self-pay | Admitting: *Deleted

## 2011-10-22 NOTE — Telephone Encounter (Signed)
Call from pt.  States he continues to have tremors; taking Inderal 40mg  BID. Wants to know if you could increase the dosage?  Thanks

## 2011-10-22 NOTE — Telephone Encounter (Signed)
Pt states since Monday, he has been taking 80mg  (2 - 40mg  tabs) twice a day and now he's out of medication. Also states sometimes he has the tremors and sometimes he does not.

## 2011-10-22 NOTE — Telephone Encounter (Signed)
Spoke with patient.  He confirmed that he has been taking Propranolol 80 mg BID since the last telephone encounter, rather than 60 mg BID.  He notes no lightheadedness or unsteadiness, but also no significant change in his tremors.  We discussed that to control essential tremor, sometimes higher doses than what he is taking are required, but that we needed to check his blood pressure before we could safely increase this medication.  I'll ask the front desk to call him with an appointment in the next few days if possible, to present for a clinic visit.  We'll check his BP, if stable, we can continue to increase propranolol (perhaps to 120 BID).  If he continues to have little to no response, it may be time to stop the medication, as not everyone will respond to this medication.

## 2011-10-22 NOTE — Telephone Encounter (Signed)
Appt has been scheduled w/front desk for Fri 5/3 @ 1115AM - pt was called and agreed.

## 2011-10-24 ENCOUNTER — Encounter: Payer: Self-pay | Admitting: Ophthalmology

## 2011-10-24 ENCOUNTER — Ambulatory Visit (INDEPENDENT_AMBULATORY_CARE_PROVIDER_SITE_OTHER): Payer: Medicare Other | Admitting: Ophthalmology

## 2011-10-24 ENCOUNTER — Ambulatory Visit (INDEPENDENT_AMBULATORY_CARE_PROVIDER_SITE_OTHER): Payer: Medicare Other | Admitting: Dietician

## 2011-10-24 VITALS — BP 134/91 | HR 82 | Temp 97.1°F | Ht 73.0 in | Wt 216.2 lb

## 2011-10-24 DIAGNOSIS — E119 Type 2 diabetes mellitus without complications: Secondary | ICD-10-CM

## 2011-10-24 MED ORDER — INSULIN GLARGINE 100 UNIT/ML ~~LOC~~ SOLN: 18.0000 [IU] | Freq: Every day | SUBCUTANEOUS | Status: DC

## 2011-10-24 MED ORDER — INSULIN PEN NEEDLE 31G X 5 MM MISC: 1.0000 | Freq: Every day | Status: DC

## 2011-10-24 NOTE — Progress Notes (Signed)
Subjective:   [name]   DOB: 04/15/1951 61 y.o.   MRN: [id number]  HPI: [name] is a 61 y.o. man here for follow up of recent rise in A1c from 7.7 to 10.9 noted 3/27.   DM II- Though he is not due for an A1c, I will get one today to see how his sugar has been doing in the past month to see if there is any improvement. He says he was eating more and was not exercising over the winter. He says he did run out of metformin but only for a few days. He says once in a while he forgets second dose of metformin, maybe once a week. Been trying to eat less junk, has been walking more. [place]. [place]. Had moved one year ago and is regretting that. Had a park that he walked in regularly when he lived on this side of town. Checks it every other day in the morning before eating- was 129 on Monday, this AM was 181. Has checked only a few values after eating and he says that it was in the 180s-190s.   HTN- blood pressure is 134/91 today, which is better than 141/83 at his last visit  HLD- well controlled  Knee pain- taking vicodin not every day, is trying to get off of it  Essential tremor- propranolol is helping  Past Medical History  Diagnosis Date  . Diabetes mellitus, type 2   . Hypertension   . Hyperlipidemia   . Lipoma of skin   . PSVT (paroxysmal supraventricular tachycardia)     Required adenosine 2006  . Trochanteric bursitis of left hip   . History of tobacco abuse     Year started: 24   Year quit: 01/2007  . COPD (chronic obstructive pulmonary disease)   . Erectile dysfunction   . Osteoarthritis   . Knee osteoarthritis     Bilateral,   Patient has been evaluated by Ortho for knee replacement in the past  . Baker's cyst, ruptured    Current Outpatient Prescriptions  Medication Sig Dispense Refill  . albuterol (PROVENTIL,VENTOLIN) 90 MCG/ACT  inhaler Inhale 2 puffs into the lungs every 4 (four) hours as needed.  17 g  5  . aspirin (ANACIN) 81 MG EC tablet Take 1 tablet (81 mg total) by mouth daily.  30 tablet  11  . Blood Glucose Monitoring Suppl (ACCU-CHEK AVIVA PLUS) W/DEVICE KIT 1 kit by Does not apply route daily.  1 kit  0  . diltiazem (TIAZAC) 240 MG 24 hr capsule Take 1 capsule (240 mg total) by mouth daily.  30 capsule  11  . Fluticasone-Salmeterol (ADVAIR DISKUS) 250-50 MCG/DOSE AEPB Inhale 1 puff into the lungs 2 (two) times daily.  60 each  11  . glipiZIDE (GLUCOTROL) 10 MG tablet Take 1 tablet (10 mg total) by mouth daily.  31 tablet  5  . glucose blood (ACCU-CHEK AVIVA) test strip 1 each by Other route daily.  100 each  3  . HYDROcodone-acetaminophen (VICODIN ES) 7.5-750 MG per tablet Take 1 tablet by mouth every 6 (six) hours as needed (for knee pain).  120 tablet  5  . lisinopril (PRINIVIL,ZESTRIL) 2.5 MG tablet Take 1 tablet (2.5 mg total) by mouth daily.  30 tablet  11  . metFORMIN (GLUCOPHAGE) 500 MG tablet Take 2 tablets (1,000 mg total)  by mouth 2 (two) times daily with a meal.  120 tablet  11  . propranolol (INDERAL) 40 MG tablet Take 1 tablet (40 mg total) by mouth 2 (two) times daily.  60 tablet  11  . sildenafil (VIAGRA) 25 MG tablet Use as directed       . simvastatin (ZOCOR) 40 MG tablet Take 1 tablet (40 mg total) by mouth daily.  90 tablet  2  . tiotropium (SPIRIVA HANDIHALER) 18 MCG inhalation capsule Place 1 capsule (18 mcg total) into inhaler and inhale daily.  30 capsule  11   Family History  Problem Relation Age of Onset  . Cancer Mother     Throat cancer  . Cirrhosis Father   . Alcohol abuse Father    History   Social History  . Marital Status: Divorced    Spouse Name: N/A    Number of Children: N/A  . Years of Education: N/A   Social History Main Topics  . Smoking status: Former Smoker -- 0.5 packs/day    Types: Cigarettes    Quit date: 01/23/2007  . Smokeless tobacco: None   Comment:  quit 4 yrs ago  . Alcohol Use: No  . Drug Use: No  . Sexually Active: None   Other Topics Concern  . None   Social History Narrative   Patient wants to use [facility] for diabetes testing supplies. These forms should be put directly into doctor's box.   Merrie Roof RN    Objective:  Physical Exam: Filed Vitals:   10/24/11 1126  BP: 134/91  Pulse: 82  Temp: 97.1 F (36.2 C)  TempSrc: Oral  Height: 6\' 1"  (1.854 m)  Weight: 216 lb 3.2 oz (98.068 kg)  SpO2: 100%   General: pleasant elderly male sitting in chair, has prostheses for both LE HEENT: PERRL, EOMI, no scleral icterus Cardiac: RRR, no rubs, murmurs or gallops Pulm: clear to auscultation bilaterally, moving normal volumes of air Abd: soft, nontender, nondistended, BS present Ext: warm and well perfused, no pedal edema Neuro: alert and oriented X3, cranial nerves II-XII grossly intact  Assessment & Plan:

## 2011-10-24 NOTE — Progress Notes (Signed)
Diabetes Self-Management Training (DSMT)  Follow-Up 4 Visit  10/24/2011 [name], identified by name and date of birth, is a 61 y.o. male with Type 2 Diabetes. Year of diabetes diagnosis:   ASSESSMENT Patient concerns are Medication.  There were no vitals taken for this visit. There is no height or weight on file to calculate BMI. Lab Results  Component Value Date   LDLCALC 50 09/17/2011   Lab Results  Component Value Date   HGBA1C 11.7 10/24/2011   Labs reviewed. Support systems: lives alone Special needs: Simplified materials Prior DM Education: Yes   Medications See Medications list.  Is interested in learning more and Needs skills/knowledge review  Patients belief/attitude about diabetes: Diabetes can be controlled.  Self foot exams daily: Yes  Diabetes Complications: None   Exercise Plan Doing walking for 30 minutesa day.   Self-Monitoring Frequency of testing: 1-2 times/day  Hyperglycemia: Yes Daily Hypoglycemia: No   Meal Planning Some knowledge   Assessment comments: here for insulin instruction per physician order. Patient self injected saline using syringe. He was then taught to use insulin pen using teach back method. He learned without difficulty. He was asked to return for follow up DSMT at same time as next doctor visit.    INDIVIDUAL DIABETES EDUCATION PLAN:  Medication Nutrition & meal planning Acute complications _______________________________________________________________________  Intervention TOPICS COVERED TODAY:  Medication  Taught/evaluated insulin injection, site rotation, insulin storage and needle disposal.  PATIENTS GOALS/PLAN (copy and paste in patient instructions so patient receives a copy): 1.  Learning Objective:       Know all steps in insulin injection using pen 2.  Behavioral Objective:         Medications: To improve blood glucose levels, I will take my medication as prescribed Never 0%  Personalized  Follow-Up Plan for Ongoing Self Management Support:  Doctor's Office, church, friends and CDE visits ______________________________________________________________________   Outcomes Expected outcomes: Demonstrated interest in learning.Expect positive changes in lifestyle. Self-care Barriers: Low literacy, Lack of material resources Education material provided: yes- instruction using pictures  Patient to contact team via Phone if problems or questions. Time in: 1200     Time out: 1230  Future DSMT - 4-6 wks   [name], Lupita Leash

## 2011-10-24 NOTE — Assessment & Plan Note (Addendum)
Patient has continued rise in HgA1c now to 11.7 despite trying to exercise more and eat less and healthier foods. He has likely lost insulin secreting function of his pancreas. He denies excess thirst or polyuria but he has lost 15 lbs in past year. He says this is intentional since he met with Lupita Leash and has been trying to exercise more and eat less. I was planning to check a UA but on chart review patient has mad maximal glucosuria since 2010 so this would not be helpful. We arranged an immediate visit with Norm Parcel for teaching on how to give lantus injection. Patient is not comfortable with needles so I would like to keep him to one injection a day, we will start him at 18 units (0.2unit/kg). I will continue his metformin. I was to stop his glipizide but Lupita Leash reports that glipizide can not work in the setting of glucose toxicity but regain function once glucose levels have returned to normal. Given how elevated his A1c is, he will likely need prandial insulin if the glipizide does not work. I have asked him to keep a log of his sugars two times a day (wake-up and before dinner) and to return to clinic in 1 month. Lupita Leash advises that NPH would likely give tighter control than lantus. Will discuss case with her as needed.

## 2011-10-24 NOTE — Patient Instructions (Signed)
-  It is important that you start taking the lantus insulin every evening before you go to sleep. Please check your morning blood sugar and call the clinic at [phone] if your sugar is less than 70. -Please also check your sugar before eating dinner so when you come back we can make adjustments to your diabetes regimen.

## 2011-10-27 ENCOUNTER — Telehealth: Payer: Self-pay | Admitting: Dietician

## 2011-10-27 NOTE — Telephone Encounter (Signed)
Patient left voicemail that he could not stat his insulin because we forgot to give it to him. CDE had told patient at insulin instruction that he would have to get his insulin pens at the pharmacy.   Spoke to patient today- 10-27-11 and he says his blood sugars have been doing well without the insulin: cell phone was breaking up so hard to hear him. Informed patient to pick lantus up at pharmacy and start it per doctor order and we'd talk tomorrow or the next day about how it is affecting his blood sugars

## 2011-10-28 ENCOUNTER — Telehealth: Payer: Self-pay | Admitting: Dietician

## 2011-10-28 ENCOUNTER — Other Ambulatory Visit: Payer: Self-pay | Admitting: Ophthalmology

## 2011-10-28 MED ORDER — INSULIN DETEMIR 100 UNIT/ML ~~LOC~~ SOLN: 15.0000 [IU] | Freq: Every day | SUBCUTANEOUS | Status: DC

## 2011-10-28 NOTE — Telephone Encounter (Signed)
Thank you [name]. I prescribed the levemir 15 units and we can titrate up.

## 2011-10-28 NOTE — Telephone Encounter (Signed)
Call from patient: Patients insurance does not cover lantus. Called pharmacy: Patient needs rx for Levemir flexpen.

## 2011-10-30 NOTE — Telephone Encounter (Signed)
Called patient to be sure he knows that a new insulin prescription was sent in with a new dose. He has not started it yet- a friend is picking it up now. He reports his blood sugar this am was 133. He thinks  It is lower because he is watching his diet better. He verbalized understanding new dose and new insulin.

## 2011-11-03 ENCOUNTER — Telehealth: Payer: Self-pay | Admitting: Dietician

## 2011-11-03 NOTE — Telephone Encounter (Signed)
Discussed proper insulin pen storage with patient.

## 2011-11-04 ENCOUNTER — Telehealth: Payer: Self-pay | Admitting: *Deleted

## 2011-11-04 ENCOUNTER — Ambulatory Visit (INDEPENDENT_AMBULATORY_CARE_PROVIDER_SITE_OTHER): Payer: Medicare Other | Admitting: Internal Medicine

## 2011-11-04 ENCOUNTER — Encounter: Payer: Self-pay | Admitting: Internal Medicine

## 2011-11-04 VITALS — BP 110/69 | HR 76 | Temp 98.1°F | Wt 215.5 lb

## 2011-11-04 DIAGNOSIS — J069 Acute upper respiratory infection, unspecified: Secondary | ICD-10-CM | POA: Insufficient documentation

## 2011-11-04 DIAGNOSIS — I10 Essential (primary) hypertension: Secondary | ICD-10-CM

## 2011-11-04 DIAGNOSIS — E119 Type 2 diabetes mellitus without complications: Secondary | ICD-10-CM

## 2011-11-04 DIAGNOSIS — M1990 Unspecified osteoarthritis, unspecified site: Secondary | ICD-10-CM

## 2011-11-04 DIAGNOSIS — M25559 Pain in unspecified hip: Secondary | ICD-10-CM

## 2011-11-04 DIAGNOSIS — M25552 Pain in left hip: Secondary | ICD-10-CM

## 2011-11-04 MED ORDER — HYDROCODONE-ACETAMINOPHEN 7.5-750 MG PO TABS: 1.0000 | ORAL_TABLET | Freq: Four times a day (QID) | ORAL | Status: DC | PRN

## 2011-11-04 MED ORDER — GUAIFENESIN-CODEINE 100-10 MG/5ML PO SYRP: 5.0000 mL | ORAL_SOLUTION | Freq: Three times a day (TID) | ORAL | Status: AC | PRN

## 2011-11-04 NOTE — Assessment & Plan Note (Signed)
Lab Results  Component Value Date   HGBA1C 11.7 10/24/2011   HGBA1C 6.3 07/09/2010   CREATININE 0.82 02/05/2011   MICROALBUR 8.29* 04/09/2011   MICRALBCREAT 32.8* 04/09/2011   CHOL 121 09/17/2011   HDL 38* 09/17/2011   TRIG 163* 09/17/2011    Last eye exam and foot exam:    Component Value Date/Time   HMDIABEYEEXA no diabetic retinopathy; early hypertensive changes 09/27/2008   HMDIABFOOTEX done 05/15/2010    Assessment: Diabetes control: not controlled Progress toward goals: unchanged Barriers to meeting goals: no barriers identified  Plan: Diabetes treatment: continue current medications recently started insulin, says he has been taking shots daily without difficulty Refer to: none Instruction/counseling given: reminded to bring blood glucose meter & log to each visit and reminded to bring medications to each visit

## 2011-11-04 NOTE — Telephone Encounter (Signed)
Pt called to talk with Dr Manson Passey.   He wants to talk about a letter for the housing authority.  Can you give him a call at [phone]

## 2011-11-04 NOTE — Assessment & Plan Note (Signed)
Lab Results  Component Value Date   NA 138 02/05/2011   K 4.4 02/05/2011   CL 102 02/05/2011   CO2 26 02/05/2011   BUN 11 02/05/2011   CREATININE 0.82 02/05/2011    BP Readings from Last 3 Encounters:  11/04/11 110/69  10/24/11 134/91  09/17/11 141/83    Assessment: Hypertension control:  controlled  Progress toward goals:  improved Barriers to meeting goals:  no barriers identified  Plan: Hypertension treatment:  continue current medications

## 2011-11-04 NOTE — Patient Instructions (Signed)
Please take cough syrup as needed for cough. Please follow up in 2 months. If your symptoms do not improved within one week, please call the clinic and ask to speak to Dr. Baltazar Apo.

## 2011-11-04 NOTE — Progress Notes (Signed)
Subjective:     Patient ID: [name], male   DOB: February 17, 1951, 61 y.o.   MRN: [id number]  HPI [name] is a 61 year old gentleman with past medical history significant for hypertension, hyperlipidemia, diabetes who presents to the clinic today for cough. Patient reports productive cough for the last 2 days. He also had subjective fevers and chills but did not have a thermometer to check his temperature. He believes that he may have caught a cold at the pharmacy when picking up his insulin. No sick contacts at home. No nausea or vomiting reported. Patient denies headache, runny nose, sneezing or sinus congestion. Patient does not smoke. Patient reports to be taking his insulin 15 units daily and states that he is tolerating it fine.No other complaints or concerns today.   Review of Systems  Constitutional: Positive for fever and chills. Negative for activity change and appetite change.  HENT: Negative for congestion, rhinorrhea and sneezing.   Eyes: Negative for discharge.  Respiratory: Positive for cough. Negative for chest tightness, shortness of breath and wheezing.        Objective:   Physical Exam  Constitutional: He appears well-developed.  HENT:  Head: Normocephalic and atraumatic.  Mouth/Throat: Oropharynx is clear and moist. No oropharyngeal exudate.  Eyes: EOM are normal. Pupils are equal, round, and reactive to light. Right eye exhibits no discharge. Left eye exhibits no discharge.  Neck: Normal range of motion. Neck supple.  Cardiovascular: Normal rate, regular rhythm and normal heart sounds.   Pulmonary/Chest: Effort normal and breath sounds normal. No respiratory distress. He has no wheezes. He has no rales.  Abdominal: Soft. Bowel sounds are normal.  Lymphadenopathy:    He has no cervical adenopathy.

## 2011-11-04 NOTE — Assessment & Plan Note (Signed)
Onset 2 days. Viral vs bacterial. Given history of COPD, it may be bacterial. Patient is not short of breath, and no wheezing evident, thus do not believe this is COPD exacerbation. Will hold off on abx, and prescribe cough medicine. If symptoms do not improve in next few days, he may need CXR and abx. Advised patient to contact clinic if his symptoms worsened.

## 2011-11-05 ENCOUNTER — Encounter: Payer: Self-pay | Admitting: Internal Medicine

## 2011-11-05 LAB — COMPREHENSIVE METABOLIC PANEL
AST: 10 U/L (ref 0–37)
Alkaline Phosphatase: 75 U/L (ref 39–117)
BUN: 14 mg/dL (ref 6–23)
Glucose, Bld: 192 mg/dL — ABNORMAL HIGH (ref 70–99)
Sodium: 141 mEq/L (ref 135–145)
Total Bilirubin: 0.3 mg/dL (ref 0.3–1.2)

## 2011-11-05 NOTE — Telephone Encounter (Signed)
Called patient.  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  He desires to move closer to his family, so that they can help him with his chronic health problems.  I will write this letter, and the patient can pick it up Friday 11/07/11.

## 2011-11-16 ENCOUNTER — Other Ambulatory Visit: Payer: Self-pay | Admitting: Internal Medicine

## 2011-12-15 ENCOUNTER — Telehealth: Payer: Self-pay | Admitting: Dietician

## 2011-12-15 NOTE — Telephone Encounter (Signed)
Returned call and left a message for patient to call front office for an appointment around the first week of August for an A1C

## 2012-01-14 ENCOUNTER — Encounter: Payer: Medicare Other | Admitting: Internal Medicine

## 2012-01-16 ENCOUNTER — Other Ambulatory Visit: Payer: Self-pay | Admitting: *Deleted

## 2012-01-16 DIAGNOSIS — I10 Essential (primary) hypertension: Secondary | ICD-10-CM

## 2012-01-18 MED ORDER — DILTIAZEM HCL ER BEADS 240 MG PO CP24: 240.0000 mg | ORAL_CAPSULE | Freq: Every day | ORAL | Status: DC

## 2012-01-27 ENCOUNTER — Other Ambulatory Visit: Payer: Self-pay | Admitting: Internal Medicine

## 2012-01-28 ENCOUNTER — Other Ambulatory Visit: Payer: Self-pay | Admitting: *Deleted

## 2012-01-28 ENCOUNTER — Encounter: Payer: Medicare Other | Admitting: Internal Medicine

## 2012-01-28 MED ORDER — ALBUTEROL SULFATE HFA 108 (90 BASE) MCG/ACT IN AERS: 2.0000 | INHALATION_SPRAY | RESPIRATORY_TRACT | Status: DC | PRN

## 2012-01-28 NOTE — Telephone Encounter (Signed)
Fax from pharmacy for refill Proventil HFA Inhaler - had to put in new rx request so please check.

## 2012-02-06 ENCOUNTER — Other Ambulatory Visit: Payer: Self-pay | Admitting: *Deleted

## 2012-02-06 DIAGNOSIS — J449 Chronic obstructive pulmonary disease, unspecified: Secondary | ICD-10-CM

## 2012-02-06 MED ORDER — TIOTROPIUM BROMIDE MONOHYDRATE 18 MCG IN CAPS: 18.0000 ug | ORAL_CAPSULE | Freq: Every day | RESPIRATORY_TRACT | Status: DC

## 2012-03-10 ENCOUNTER — Other Ambulatory Visit: Payer: Self-pay | Admitting: *Deleted

## 2012-03-10 ENCOUNTER — Encounter: Payer: Self-pay | Admitting: Internal Medicine

## 2012-03-10 ENCOUNTER — Ambulatory Visit (INDEPENDENT_AMBULATORY_CARE_PROVIDER_SITE_OTHER): Payer: Medicare Other | Admitting: Internal Medicine

## 2012-03-10 VITALS — BP 132/84 | HR 73 | Temp 97.7°F | Ht 73.0 in | Wt 229.6 lb

## 2012-03-10 DIAGNOSIS — M25552 Pain in left hip: Secondary | ICD-10-CM

## 2012-03-10 DIAGNOSIS — M1990 Unspecified osteoarthritis, unspecified site: Secondary | ICD-10-CM

## 2012-03-10 DIAGNOSIS — Z0000 Encounter for general adult medical examination without abnormal findings: Secondary | ICD-10-CM

## 2012-03-10 DIAGNOSIS — Z23 Encounter for immunization: Secondary | ICD-10-CM

## 2012-03-10 DIAGNOSIS — M25559 Pain in unspecified hip: Secondary | ICD-10-CM

## 2012-03-10 DIAGNOSIS — I10 Essential (primary) hypertension: Secondary | ICD-10-CM

## 2012-03-10 DIAGNOSIS — E119 Type 2 diabetes mellitus without complications: Secondary | ICD-10-CM

## 2012-03-10 MED ORDER — HYDROCODONE-ACETAMINOPHEN 7.5-750 MG PO TABS: 1.0000 | ORAL_TABLET | Freq: Four times a day (QID) | ORAL | Status: DC | PRN

## 2012-03-10 NOTE — Assessment & Plan Note (Signed)
The patient has a history of DM, currently on levemir 15 and metformin.  His A1C was elevated to 11.7 at his last visit.  Due to changes in his insulin, as well as his exercise routine, A1C has now decreased to 6.8. -continue metformin, levemir -encouraged patient to continue diet/exercise changes -patient has a goal of discontinuing insulin treatment.  We discuss that weight loss may help him decrease his insulin usage, or possibly even be able to stop using insulin.

## 2012-03-10 NOTE — Progress Notes (Signed)
HPI The patient is a 61 y.o. yo male with a history of COPD, HL, DM, HTN, presenting for a follow-up visit.  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.  The patient has a history of COPD.  He notes using his advair and spiriva inhaler daily.  He also notes having to use albuterol once per day after walking up a hill to the bus stop (takes 15 minutes), due to getting "out of breath".  This has been stable over the last several years.  The patient has   The patient's BP is a little elevated today, though he notes he was rushing to this appointment.  Repeat BP in-office is 132/84.  The patient has a history of DM.  He brings his journal of blood sugar recordings (not glucometer).  He notes 1 day in the last month when his glucometer read "out of range, too low", though he felt "fine", with no shakiness, confusion, diaphoresis, and lightheaded.  The highest value in the last month is 190, and most values are 120-140's (fasting).  The patient only checks his blood sugar once/day, fasting before breakfast.  He notes no polyuria/polydipsia, blurry vision.  His last eye exam was 7 months ago.   ROS: General: no fevers, chills, changes in weight, changes in appetite Skin: no rash HEENT: no blurry vision, hearing changes, sore throat Pulm: no dyspnea, coughing, wheezing CV: no chest pain, palpitations, shortness of breath Abd: no abdominal pain, nausea/vomiting, diarrhea/constipation GU: no dysuria, hematuria, polyuria Ext: no arthralgias, myalgias Neuro: no weakness, numbness, or tingling  Filed Vitals:   03/10/12 1506  BP: 132/84  Pulse:   Temp:    Current Outpatient Prescriptions on File Prior to Visit  Medication Sig Dispense Refill  . albuterol (PROVENTIL HFA;VENTOLIN HFA) 108 (90 BASE) MCG/ACT inhaler Inhale 2 puffs into the lungs every 4 (four) hours as needed for wheezing.  6.7 g  11  . albuterol  (PROVENTIL,VENTOLIN) 90 MCG/ACT inhaler Inhale 2 puffs into the lungs every 4 (four) hours as needed.  17 g  5  . aspirin (ANACIN) 81 MG EC tablet Take 1 tablet (81 mg total) by mouth daily.  30 tablet  11  . Blood Glucose Monitoring Suppl (ACCU-CHEK AVIVA PLUS) W/DEVICE KIT 1 kit by Does not apply route daily.  1 kit  0  . diltiazem (TIAZAC) 240 MG 24 hr capsule Take 1 capsule (240 mg total) by mouth daily.  30 capsule  11  . Fluticasone-Salmeterol (ADVAIR DISKUS) 250-50 MCG/DOSE AEPB Inhale 1 puff into the lungs 2 (two) times daily.  60 each  11  . glipiZIDE (GLUCOTROL) 10 MG tablet Take 1 tablet (10 mg total) by mouth daily.  31 tablet  5  . glucose blood (ACCU-CHEK AVIVA) test strip 1 each by Other route daily.  100 each  3  . insulin detemir (LEVEMIR) 100 UNIT/ML injection Inject 15 Units into the skin daily.  15 mL  12  . Insulin Pen Needle (B-D UF III MINI PEN NEEDLES) 31G X 5 MM MISC 1 each by Does not apply route at bedtime.  100 each  3  . lisinopril (PRINIVIL,ZESTRIL) 2.5 MG tablet TAKE 1 TABLET BY MOUTH EVERY DAY  30 tablet  11  . metFORMIN (GLUCOPHAGE) 500 MG tablet Take 2 tablets (1,000 mg total) by mouth 2 (two) times daily with a meal.  120 tablet  11  . propranolol (  INDERAL) 40 MG tablet Take 1 tablet (40 mg total) by mouth 2 (two) times daily.  60 tablet  11  . sildenafil (VIAGRA) 25 MG tablet Use as directed       . simvastatin (ZOCOR) 40 MG tablet TAKE 1 TABLET BY MOUTH EVERY DAY  90 tablet  3  . tiotropium (SPIRIVA HANDIHALER) 18 MCG inhalation capsule Place 1 capsule (18 mcg total) into inhaler and inhale daily.  30 capsule  11  . DISCONTD: HYDROcodone-acetaminophen (VICODIN ES) 7.5-750 MG per tablet Take 1 tablet by mouth every 6 (six) hours as needed (for knee pain).  120 tablet  3    PEX General: alert, cooperative, and in no apparent distress HEENT: pupils equal round and reactive to light, vision grossly intact, oropharynx clear and non-erythematous  Neck: supple, no  lymphadenopathy Lungs: clear to ascultation bilaterally, normal work of respiration, no wheezes, rales, ronchi Heart: regular rate and rhythm, no murmurs, gallops, or rubs Abdomen: soft, non-tender, non-distended, normal bowel sounds Extremities: 2+ DP/PT pulses bilaterally, no cyanosis, clubbing, or edema Neurologic: alert & oriented X3, cranial nerves II-XII intact, strength grossly intact, sensation intact to light touch  Assessment/Plan

## 2012-03-10 NOTE — Assessment & Plan Note (Signed)
Well-controlled, continue current regimen 

## 2012-03-10 NOTE — Assessment & Plan Note (Signed)
-   flu shot given today 

## 2012-03-10 NOTE — Patient Instructions (Signed)
Your Hemoglobin A1C (a marker for your blood sugars for the last 3 months) has dropped to 6.8, which represents great blood sugar control!  Congratulations! -as we discussed, diet, exercise, and weight loss can further improve your blood sugar, and can help [place] towards your goal of decreasing your insulin usage  We have given you your flu shot today.  You may experience some soreness at the injection site for the next 1-2 days, which is normal.  Please return for a follow-up visit in about 3 months.

## 2012-03-25 ENCOUNTER — Other Ambulatory Visit: Payer: Self-pay | Admitting: *Deleted

## 2012-03-25 DIAGNOSIS — J449 Chronic obstructive pulmonary disease, unspecified: Secondary | ICD-10-CM

## 2012-03-25 MED ORDER — FLUTICASONE-SALMETEROL 250-50 MCG/DOSE IN AEPB: 1.0000 | INHALATION_SPRAY | Freq: Two times a day (BID) | RESPIRATORY_TRACT | Status: DC

## 2012-03-26 ENCOUNTER — Other Ambulatory Visit: Payer: Self-pay | Admitting: Internal Medicine

## 2012-04-02 ENCOUNTER — Other Ambulatory Visit: Payer: Self-pay | Admitting: Internal Medicine

## 2012-04-07 ENCOUNTER — Telehealth: Payer: Self-pay | Admitting: *Deleted

## 2012-04-07 NOTE — Telephone Encounter (Signed)
Pt called [facility] and needs refill on generic Vicodin ES - pharmacy states store rules can fill only 2 days early unless they hear from doctor. Dr Manson Passey is in clinic this AM - discuss above with him ok to refill early. Talked with pharmacy was to fill 04/08/12 - aware Dr Manson Passey ok refill today. Pt aware to give pharmacy 1 hour to fill. Stanton Kidney Oluwademilade Kellett RN 04/07/12 9AM

## 2012-06-04 ENCOUNTER — Other Ambulatory Visit: Payer: Self-pay | Admitting: *Deleted

## 2012-06-04 DIAGNOSIS — M1990 Unspecified osteoarthritis, unspecified site: Secondary | ICD-10-CM

## 2012-06-04 DIAGNOSIS — M25552 Pain in left hip: Secondary | ICD-10-CM

## 2012-06-04 MED ORDER — HYDROCODONE-ACETAMINOPHEN 7.5-325 MG PO TABS: 1.0000 | ORAL_TABLET | Freq: Four times a day (QID) | ORAL | Status: DC | PRN

## 2012-06-04 NOTE — Telephone Encounter (Signed)
Rx called in 

## 2012-06-04 NOTE — Telephone Encounter (Signed)
Pt called for a refill on vicodin.  Pt has refills but he has been getting vicodin 7.5-750. Will need to be changed to Vicodin 7.5-325 mg. Last received 11/14

## 2012-08-02 ENCOUNTER — Other Ambulatory Visit: Payer: Self-pay | Admitting: *Deleted

## 2012-08-02 DIAGNOSIS — E119 Type 2 diabetes mellitus without complications: Secondary | ICD-10-CM

## 2012-08-02 MED ORDER — METFORMIN HCL 500 MG PO TABS: 1000.0000 mg | ORAL_TABLET | Freq: Two times a day (BID) | ORAL | Status: DC

## 2012-08-04 ENCOUNTER — Other Ambulatory Visit: Payer: Self-pay | Admitting: *Deleted

## 2012-08-04 ENCOUNTER — Encounter: Payer: Medicare Other | Admitting: Internal Medicine

## 2012-08-04 MED ORDER — SILDENAFIL CITRATE 25 MG PO TABS: 25.0000 mg | ORAL_TABLET | Freq: Every day | ORAL | Status: DC | PRN

## 2012-08-18 ENCOUNTER — Encounter: Payer: Self-pay | Admitting: Internal Medicine

## 2012-08-18 ENCOUNTER — Ambulatory Visit (INDEPENDENT_AMBULATORY_CARE_PROVIDER_SITE_OTHER): Payer: Medicare Other | Admitting: Internal Medicine

## 2012-08-18 VITALS — BP 130/82 | HR 70 | Temp 97.3°F | Ht 73.0 in | Wt 230.6 lb

## 2012-08-18 DIAGNOSIS — E785 Hyperlipidemia, unspecified: Secondary | ICD-10-CM

## 2012-08-18 LAB — BASIC METABOLIC PANEL
CO2: 30 mEq/L (ref 19–32)
Chloride: 105 mEq/L (ref 96–112)
Glucose, Bld: 149 mg/dL — ABNORMAL HIGH (ref 70–99)
Potassium: 3.8 mEq/L (ref 3.5–5.3)
Sodium: 144 mEq/L (ref 135–145)

## 2012-08-18 LAB — CBC
Hemoglobin: 13 g/dL (ref 13.0–17.0)
RBC: 4.34 MIL/uL (ref 4.22–5.81)

## 2012-08-18 LAB — GLUCOSE, CAPILLARY: Glucose-Capillary: 160 mg/dL — ABNORMAL HIGH (ref 70–99)

## 2012-08-18 LAB — POCT GLYCOSYLATED HEMOGLOBIN (HGB A1C): Hemoglobin A1C: 7.3

## 2012-08-18 NOTE — Assessment & Plan Note (Signed)
Patient's last LDL = 50, when checked 11 months ago.  Per current guidelines, will continue patient's current statin therapy, will not recheck at this time.

## 2012-08-18 NOTE — Assessment & Plan Note (Signed)
The patient's BP was initially elevated when taken over 2 sweaters, but repeat was normal.  Continue current regimen.

## 2012-08-18 NOTE — Progress Notes (Signed)
HPI The patient is a 62 y.o. male with a history of DM2, HTN, HL, COPD (GOLD stage 3), presenting for a routine follow-up visit.  The patient has a history of DM.  He brings his glucometer.  Blood sugars range 97-156.  No hypoglycemia, diaphoresis, tremulousness, AMS, blurry vision, or polyuria/polydipsia.  The patient has had difficulty exercising due to the winter weather recently.  No major dietary changes.  Patient has gained 7 lbs since his last visit.  The patient is still taking levemir 15 units daily.  The patient's last eye exam was over 1 year ago, and he goes to an eye doctor on [address] nearby.  A1C has increased slightly from 6.8 to 7.3.  The patient's BP is mildly elevated today at 160/98.  Repeat BP is 130/82.  ROS: General: no fevers, chills, changes in appetite Skin: no rash HEENT: no blurry vision, hearing changes, sore throat Pulm: no dyspnea, coughing, wheezing CV: no chest pain, palpitations, shortness of breath Abd: no abdominal pain, nausea/vomiting, diarrhea/constipation GU: no dysuria, hematuria, polyuria Ext: no arthralgias, myalgias Neuro: no weakness, numbness, or tingling  Filed Vitals:   08/18/12 1414  BP: 130/82  Pulse:   Temp:     PEX General: alert, cooperative, and in no apparent distress HEENT: pupils equal round and reactive to light, vision grossly intact, oropharynx clear and non-erythematous  Neck: supple, no lymphadenopathy Lungs: clear to ascultation bilaterally, normal work of respiration, no wheezes, rales, ronchi Heart: regular rate and rhythm, no murmurs, gallops, or rubs Abdomen: soft, non-tender, non-distended, normal bowel sounds Extremities: no cyanosis, clubbing, or edema Neurologic: alert & oriented X3, cranial nerves II-XII intact, strength grossly intact, sensation intact to light touch  Current Outpatient Prescriptions on File Prior to Visit  Medication Sig Dispense Refill  . albuterol (PROVENTIL HFA;VENTOLIN HFA) 108 (90  BASE) MCG/ACT inhaler Inhale 2 puffs into the lungs every 4 (four) hours as needed for wheezing.  6.7 g  11  . albuterol (PROVENTIL,VENTOLIN) 90 MCG/ACT inhaler Inhale 2 puffs into the lungs every 4 (four) hours as needed.  17 g  5  . aspirin (ANACIN) 81 MG EC tablet Take 1 tablet (81 mg total) by mouth daily.  30 tablet  11  . Blood Glucose Monitoring Suppl (ACCU-CHEK AVIVA PLUS) W/DEVICE KIT 1 kit by Does not apply route daily.  1 kit  0  . diltiazem (TIAZAC) 240 MG 24 hr capsule Take 1 capsule (240 mg total) by mouth daily.  30 capsule  11  . Fluticasone-Salmeterol (ADVAIR DISKUS) 250-50 MCG/DOSE AEPB Inhale 1 puff into the lungs 2 (two) times daily.  60 each  11  . glipiZIDE (GLUCOTROL) 10 MG tablet TAKE 1 TABLET (10 MG TOTAL) BY MOUTH DAILY.  31 tablet  5  . glucose blood (ACCU-CHEK AVIVA) test strip 1 each by Other route daily.  100 each  3  . HYDROcodone-acetaminophen (NORCO) 7.5-325 MG per tablet Take 1 tablet by mouth every 6 (six) hours as needed for pain.  120 tablet  5  . insulin detemir (LEVEMIR) 100 UNIT/ML injection Inject 15 Units into the skin daily.  15 mL  12  . Insulin Pen Needle (B-D UF III MINI PEN NEEDLES) 31G X 5 MM MISC 1 each by Does not apply route at bedtime.  100 each  3  . lisinopril (PRINIVIL,ZESTRIL) 2.5 MG tablet TAKE 1 TABLET BY MOUTH EVERY DAY  30 tablet  11  . metFORMIN (GLUCOPHAGE) 500 MG tablet Take 2 tablets (1,000 mg  total) by mouth 2 (two) times daily with a meal.  120 tablet  0  . propranolol (INDERAL) 40 MG tablet Take 1 tablet (40 mg total) by mouth 2 (two) times daily.  60 tablet  11  . sildenafil (VIAGRA) 25 MG tablet Take 1 tablet (25 mg total) by mouth daily as needed for erectile dysfunction. Use as directed  10 tablet  2  . simvastatin (ZOCOR) 40 MG tablet TAKE 1 TABLET BY MOUTH EVERY DAY  90 tablet  3  . tiotropium (SPIRIVA HANDIHALER) 18 MCG inhalation capsule Place 1 capsule (18 mcg total) into inhaler and inhale daily.  30 capsule  11   No  current facility-administered medications on file prior to visit.    Assessment/Plan

## 2012-08-18 NOTE — Assessment & Plan Note (Signed)
Lab Results  Component Value Date   HGBA1C 7.3 08/18/2012   HGBA1C 6.8 03/10/2012   HGBA1C 11.7 10/24/2011     Assessment:  Diabetes control: fair control  Progress toward A1C goal:  at goal  Comments: A1C has increased slightly from 6.8 to 7.3, likely reflecting patient's self-reported decrease in exercise.  Patient expresses interest in exercising again now that the weather is improving.  Plan:  Medications:  continue current medications  Home glucose monitoring:   Frequency: 2 times a day   Timing: before meals  Instruction/counseling given: reminded to get eye exam  Educational resources provided:    Self management tools provided:    Other plans: Will keep current diabetes regimen, since A1C is nearly at goal, and encourage exercise.

## 2012-08-18 NOTE — Patient Instructions (Signed)
General Instructions: Your hemoglobin A1C has increased today from 6.8 to 7.3.  You mentioned that you have been exercising less, which may explain this increase.  As you expressed interest in returning to an exercise regimen, we will keep your insulin dose the same, and I expect this value will improve with exercise.  We have sent a referral for your yearly eye exam.  We are checking some routine labs today, and I will call you if the results are abnormal.  Please return for a follow-up visit in 4-5 months.   Treatment Goals:  Goals (1 Years of Data) as of 08/18/12         As of Today 03/10/12 10/24/11 09/17/11 04/09/11     Diet    . Have 3 meals a day           Lifestyle    . Eat Lunch          . Increase physical activity           Result Component    . HEMOGLOBIN A1C < 7.0  7.3 6.8 11.7 10.9 7.7      Progress Toward Treatment Goals:  Treatment Goal 08/18/2012  Hemoglobin A1C at goal  Blood pressure at goal    Self Care Goals & Plans:  Self Care Goal 08/18/2012  Manage my medications take my medicines as prescribed; bring my medications to every visit  Monitor my health keep track of my blood glucose    Home Blood Glucose Monitoring 08/18/2012  Check my blood sugar 2 times a day  When to check my blood sugar before meals     Care Management & Community Referrals:  Referral 08/18/2012  Referrals made for care management support none needed

## 2012-09-10 ENCOUNTER — Other Ambulatory Visit: Payer: Self-pay | Admitting: Internal Medicine

## 2012-09-25 ENCOUNTER — Other Ambulatory Visit: Payer: Self-pay | Admitting: Internal Medicine

## 2012-09-28 MED ORDER — PRAVASTATIN SODIUM 40 MG PO TABS: 40.0000 mg | ORAL_TABLET | Freq: Every evening | ORAL | Status: DC

## 2012-10-11 ENCOUNTER — Other Ambulatory Visit: Payer: Self-pay | Admitting: Internal Medicine

## 2012-10-14 ENCOUNTER — Other Ambulatory Visit: Payer: Self-pay | Admitting: Internal Medicine

## 2012-10-14 MED ORDER — METFORMIN HCL 1000 MG PO TABS: 1000.0000 mg | ORAL_TABLET | Freq: Two times a day (BID) | ORAL | Status: DC

## 2012-10-14 NOTE — Telephone Encounter (Signed)
Refill for glipizide came electronically, I called pt and told him this med was d/c. He is to continue with metformin,

## 2012-11-16 ENCOUNTER — Telehealth: Payer: Self-pay | Admitting: *Deleted

## 2012-11-16 ENCOUNTER — Ambulatory Visit (INDEPENDENT_AMBULATORY_CARE_PROVIDER_SITE_OTHER): Payer: Medicare Other | Admitting: Internal Medicine

## 2012-11-16 VITALS — BP 100/60 | HR 77 | Temp 97.3°F | Ht 73.0 in | Wt 215.4 lb

## 2012-11-16 DIAGNOSIS — E119 Type 2 diabetes mellitus without complications: Secondary | ICD-10-CM

## 2012-11-16 DIAGNOSIS — M1990 Unspecified osteoarthritis, unspecified site: Secondary | ICD-10-CM

## 2012-11-16 DIAGNOSIS — E785 Hyperlipidemia, unspecified: Secondary | ICD-10-CM

## 2012-11-16 DIAGNOSIS — R000 Tachycardia, unspecified: Secondary | ICD-10-CM

## 2012-11-16 LAB — HEPATIC FUNCTION PANEL
ALT: 12 U/L (ref 0–53)
Bilirubin, Direct: 0.1 mg/dL (ref 0.0–0.3)
Total Bilirubin: 0.6 mg/dL (ref 0.3–1.2)

## 2012-11-16 LAB — LIPID PANEL
Cholesterol: 106 mg/dL (ref 0–200)
Total CHOL/HDL Ratio: 2.9 Ratio
VLDL: 21 mg/dL (ref 0–40)

## 2012-11-16 LAB — GLUCOSE, CAPILLARY: Glucose-Capillary: 316 mg/dL — ABNORMAL HIGH (ref 70–99)

## 2012-11-16 MED ORDER — GLIPIZIDE 10 MG PO TABS: 10.0000 mg | ORAL_TABLET | Freq: Two times a day (BID) | ORAL | Status: DC

## 2012-11-16 NOTE — Telephone Encounter (Signed)
Ok to add.  [name]

## 2012-11-16 NOTE — Assessment & Plan Note (Signed)
Diabetes mellitus uncontrolled. His hemoglobin A1c today is 8. When reviewing his medication list it was noted that glipizide was stopped. It is unclear how much Levemir he is taking. Patient informed me 14 and something units. When I called the pharmacy it was noted that he filled his last Levemir beginning of April. He was due to pick it up on Oct 27 2012 which he did not. I think he has some trouble understanding how much insulin he is supposed to take and this is likely the reason why his hemoglobin A1c is now worse. He had an eye exam 2 months ago. Per patient everything was fine. We have no report on file. At this point I will continue metformin 1000 mg twice a day and restart glipizide 10 mg twice a day. Discontinue Levemir. I instructed the patient to check his blood sugars at least 3 times a day and bring his meter along with all his medications with him during the next office visit. May consider to restart insulin in the near future but I think at this point patient is overwhelmed to take insulin.

## 2012-11-16 NOTE — Patient Instructions (Addendum)
Stop taking insulin  Start glipizide 10 mg twice a day. Continue metformin 1000 mg twice a day. Please pick up the prescription from the pharmacy. It  is ready.  Please check your blood sugars 3 times a day before meals. Please bring all your medications anterior meter during the next office visit.

## 2012-11-16 NOTE — Progress Notes (Signed)
Subjective:   [name]   DOB: [birth year] 62 y.o.   MRN: [id number]  HPI: [name] is a 62 y.o.  male with past medical history significant as outlined below who presented to the clinic from the orthopaedic office. At the office he was supposed to undergo knee arthroscopy but  was found to have a heart rate of 120 and CBG of 300. An EKG was obtained which showed sinus tach cardia. Patient was referred for further evaluation to the clinic. Patient reports he is not clear what happened but denied during the episode any chest pain, shortness of breath, dizziness. He noted he took his medications including diltiazem and insulin.    Patient did not bring his medication with him. I called the [facility] pharmacy to get details about medications Past Medical History  Diagnosis Date  . Diabetes mellitus, type 2   . Hypertension   . Hyperlipidemia   . Lipoma of skin   . PSVT (paroxysmal supraventricular tachycardia)     Required adenosine 2006  . Trochanteric bursitis of left hip   . History of tobacco abuse     Year started: 36   Year quit: 01/2007  . COPD (chronic obstructive pulmonary disease)   . Erectile dysfunction   . Osteoarthritis   . Knee osteoarthritis     Bilateral,   Patient has been evaluated by Ortho for knee replacement in the past  . Baker's cyst, ruptured    Current Outpatient Prescriptions  Medication Sig Dispense Refill  . insulin detemir (LEVEMIR) 100 UNIT/ML injection Inject 15 Units into the skin daily.  15 mL  12  . albuterol (PROVENTIL HFA;VENTOLIN HFA) 108 (90 BASE) MCG/ACT inhaler Inhale 2 puffs into the lungs every 4 (four) hours as needed for wheezing.  6.7 g  11  . aspirin (ANACIN) 81 MG EC tablet Take 1 tablet (81 mg total) by mouth daily.  30 tablet  11  . Blood Glucose Monitoring Suppl (ACCU-CHEK AVIVA PLUS) W/DEVICE KIT 1 kit by Does not apply route daily.  1 kit  0  . diltiazem (TIAZAC) 240 MG 24 hr capsule Take 1 capsule  (240 mg total) by mouth daily.  30 capsule  11  . Fluticasone-Salmeterol (ADVAIR DISKUS) 250-50 MCG/DOSE AEPB Inhale 1 puff into the lungs 2 (two) times daily.  60 each  11  . glucose blood (ACCU-CHEK AVIVA) test strip 1 each by Other route daily.  100 each  3  . HYDROcodone-acetaminophen (NORCO) 7.5-325 MG per tablet Take 1 tablet by mouth every 6 (six) hours as needed for pain.  120 tablet  5  . Insulin Pen Needle (B-D UF III MINI PEN NEEDLES) 31G X 5 MM MISC 1 each by Does not apply route at bedtime.  100 each  3  . lisinopril (PRINIVIL,ZESTRIL) 2.5 MG tablet TAKE 1 TABLET BY MOUTH EVERY DAY  30 tablet  11  . metFORMIN (GLUCOPHAGE) 1000 MG tablet Take 1 tablet (1,000 mg total) by mouth 2 (two) times daily with a meal.  60 tablet  11  . pravastatin (PRAVACHOL) 40 MG tablet Take 1 tablet (40 mg total) by mouth every evening.  90 tablet  3  . propranolol (INDERAL) 40 MG tablet Take 1 tablet (40 mg total) by mouth 2 (two) times daily.  60 tablet  11  . tiotropium (SPIRIVA HANDIHALER) 18 MCG inhalation capsule Place 1 capsule (18 mcg total) into inhaler and inhale daily.  30 capsule  11   No current  facility-administered medications for this visit.   Family History  Problem Relation Age of Onset  . Cancer Mother     Throat cancer  . Cirrhosis Father   . Alcohol abuse Father    History   Social History  . Marital Status: Divorced    Spouse Name: N/A    Number of Children: N/A  . Years of Education: N/A   Social History Main Topics  . Smoking status: Former Smoker -- 0.50 packs/day    Types: Cigarettes    Quit date: 01/23/2007  . Smokeless tobacco: Not on file     Comment: quit 4 yrs ago  . Alcohol Use: No  . Drug Use: No  . Sexually Active: Not on file   Other Topics Concern  . Not on file   Social History Narrative   Patient wants to use [facility] for diabetes testing supplies. These forms should be put directly into doctor's box.   Merrie Roof RN    Review of Systems: Constitutional: Denies fever, chills, diaphoresis, appetite change and fatigue.  Respiratory: Denies SOB, DOE, cough, chest tightness,  and wheezing.   Cardiovascular: Denies chest pain, palpitations and leg swelling.  Gastrointestinal: Denies nausea, vomiting, abdominal pain, diarrhea, constipation, blood in stool and abdominal distention.  Musculoskeletal: Noted right knee pain Skin: Denies pallor, rash and wound.  Neurological: Denies dizziness, weakness, light-headedness, numbness and headaches.   Objective:  Physical Exam: Filed Vitals:   11/16/12 1449  BP: 100/60  Pulse: 77  Temp: 97.3 F (36.3 C)  TempSrc: Oral  Height: 6\' 1"  (1.854 m)  Weight: 215 lb 6.4 oz (97.705 kg)  SpO2: 99%   Constitutional: Vital signs reviewed.  Patient is a well-developed and well-nourished male in no acute distress and cooperative with exam. Alert and oriented x3.  Eyes: PERRL, EOMI, conjunctivae normal, No scleral icterus.  Neck: Supple, Cardiovascular: RRR, S1 normal, S2 normal, no MRG, pulses symmetric and intact bilaterally Pulmonary/Chest: normal respiratory effort, CTAB, no wheezes, rales, or rhonchi Abdominal: Soft. Non-tender, non-distended, bowel sounds are normal.  Neurological: A&O x3

## 2012-11-16 NOTE — Assessment & Plan Note (Addendum)
Patient is planned to undergo knee arthroscopy. Provided a document noting that patient's Tachycardia is resolved. Likely due to anxiety prior to surgery. Recommend to continue diltiazem. Patient  needs to take Diltiazem prior to surgery. Considering uncontrolled diabetes: I was started patient on glipizide and continue metformin. Will discontinue Levemir at this point. Please see more details under problem : diabetes mellitus. Per ACC/AHA guidelines Knee arthroscopy is a  low risk surgery for cardiac events.  Reviewing revised Goldman cardiac risk index it is notable that patient has a 1% rate of cardiac death, nonfatal MI, nonfatal cardiac arrest

## 2012-11-16 NOTE — Telephone Encounter (Signed)
[facility]thopedic surgical ctr calls and states that pt was to have surg procedure done today but it was cancelled due to heart rate of 120+ and blood sugar of 300+, they are requesting an immediate appt today for pt, dr Loistine Chance had one cancellation for 1500 and pt will be added to that slot

## 2012-11-16 NOTE — Assessment & Plan Note (Signed)
Tachycardia resolved. Most likely due to anxiety prior to surgery.  EKG reviewed which was obtained at the orthopedic office. Sinus tachycardia but otherwise unremarkable.Continue her diltiazem. Patient needs to take his medication to surgery. Occult the pharmacy since patient had propanolol on his medication list. He has not filled this medication since December of 2012

## 2012-11-17 NOTE — Progress Notes (Signed)
Case discussed with Dr. Illath immediately after the resident saw the patient. We reviewed the resident's history and exam and pertinent patient test results. I agree with the assessment, diagnosis and plan of care documented in the resident's note. 

## 2012-11-22 ENCOUNTER — Encounter: Payer: Self-pay | Admitting: Internal Medicine

## 2012-11-22 ENCOUNTER — Ambulatory Visit (INDEPENDENT_AMBULATORY_CARE_PROVIDER_SITE_OTHER): Payer: Medicare Other | Admitting: Internal Medicine

## 2012-11-22 VITALS — BP 115/73 | HR 70 | Temp 97.1°F | Ht 73.0 in | Wt 219.0 lb

## 2012-11-22 DIAGNOSIS — R000 Tachycardia, unspecified: Secondary | ICD-10-CM

## 2012-11-22 DIAGNOSIS — I10 Essential (primary) hypertension: Secondary | ICD-10-CM

## 2012-11-22 DIAGNOSIS — E119 Type 2 diabetes mellitus without complications: Secondary | ICD-10-CM

## 2012-11-22 LAB — GLUCOSE, CAPILLARY: Glucose-Capillary: 263 mg/dL — ABNORMAL HIGH (ref 70–99)

## 2012-11-22 MED ORDER — DILTIAZEM HCL ER BEADS 240 MG PO CP24: 240.0000 mg | ORAL_CAPSULE | Freq: Every day | ORAL | Status: DC

## 2012-11-22 MED ORDER — METFORMIN HCL 1000 MG PO TABS: 1000.0000 mg | ORAL_TABLET | Freq: Two times a day (BID) | ORAL | Status: DC

## 2012-11-22 NOTE — Assessment & Plan Note (Signed)
Lab Results  Component Value Date   HGBA1C 8.0 11/16/2012   HGBA1C 7.3 08/18/2012   HGBA1C 6.8 03/10/2012     Assessment: Diabetes control: fair control Progress toward A1C goal:  unchanged Comments: At the patient's last visit, his Levemir 15 was stopped, and glipizide 10 mg BID was started.  Interestingly, the patient notes improved blood sugars since that time, also with increased exercise since that time.  The patient prefers to avoid restarting insulin if possible.  At this time, if the patient is reporting accurate CBG's, I think it's reasonable to continue with oral hypoglycemics, though I suspect he will need to restart insulin sometime in the near future.  Plan: Medications:  Continue metformin and glipizide.  Check A1C in 3 months, and determine whether to restart insulin at that time. Home glucose monitoring: Frequency: 2 times a day Timing: before breakfast;before dinner Instruction/counseling given: reminded to bring blood glucose meter & log to each visit Educational resources provided:   Self management tools provided:   Other plans: Patient reports a recent eye exam at the [facility] on [address] within the last 2 months, will call for records

## 2012-11-22 NOTE — Patient Instructions (Addendum)
General Instructions: For your diabetes, continue taking glipizide 10 mg twice per day, as well as Metformin, 1000 mg, twice per day.  We will recheck your Hemoglobin A1C in 3 months, and determine whether you need to restart insulin for control of your diabetes.  Please return for a follow-up visit in 3 months.  Treatment Goals:  Goals (1 Years of Data) as of 11/22/12         11/16/12 08/18/12 03/10/12 10/24/11     Diet    . Have 3 meals a day          Lifestyle    . Eat Lunch         . Increase physical activity          Result Component    . HEMOGLOBIN A1C < 7.0  8.0 7.3 6.8 11.7      Progress Toward Treatment Goals:  Treatment Goal 11/22/2012  Hemoglobin A1C unchanged  Blood pressure at goal    Self Care Goals & Plans:  Self Care Goal 08/18/2012  Manage my medications take my medicines as prescribed; bring my medications to every visit  Monitor my health keep track of my blood glucose    Home Blood Glucose Monitoring 11/22/2012  Check my blood sugar 2 times a day  When to check my blood sugar before breakfast; before dinner     Care Management & Community Referrals:  Referral 11/22/2012  Referrals made for care management support none needed

## 2012-11-22 NOTE — Progress Notes (Signed)
HPI The patient is a 62 y.o. male with a history of DM, HTN, essential tremor, presenting for a follow-up visit for DM.  The patient was seen 1 week ago, at which time his levemir was stopped due to perceived difficulty understanding how to use this medication, and glipizide was restarted.  He did not bring his glucometer today, but notes fasting blood sugars in the 140's, down from the 180's.  His evening blood sugars are in the 170's.  He notes no episodes of hypoglycemia, or symptoms of shakiness, diaphoresis, or AMS.  He did not bring his glucometer.  He has tried to increase his physical activity, walking every day.  His diet is unchaged, but he states that he is trying to lose weight.  The patient's last eye exam was 2-3 months ago with the [facility] on [address] (glaucoma).  Upon further chart review, the patient was started on Levemir about 1 year ago, for an isolated A1C of 11.7.  The patient has been managed on relatively low doses of levemir (15 units), with A1C's around 7-8.  ROS: General: no fevers, chills, changes in weight, changes in appetite Skin: no rash HEENT: no blurry vision, hearing changes, sore throat Pulm: no dyspnea, coughing, wheezing CV: no chest pain, palpitations, shortness of breath Abd: no abdominal pain, nausea/vomiting, diarrhea/constipation GU: no dysuria, hematuria, polyuria Ext: +right knee pain Neuro: no weakness, numbness, or tingling  Filed Vitals:   11/22/12 0935  BP: 115/73  Pulse: 70  Temp: 97.1 F (36.2 C)    PEX General: alert, cooperative, and in no apparent distress HEENT: pupils equal round and reactive to light, vision grossly intact, oropharynx clear and non-erythematous  Neck: supple, no lymphadenopathy Lungs: clear to ascultation bilaterally, normal work of respiration, no wheezes, rales, ronchi Heart: regular rate and rhythm, no murmurs, gallops, or rubs Abdomen: soft, non-tender, non-distended, normal bowel  sounds Extremities: 2+ DP/PT pulses bilaterally, no cyanosis, clubbing, or edema Neurologic: alert & oriented X3, cranial nerves II-XII intact, strength grossly intact, sensation intact to light touch  Current Outpatient Prescriptions on File Prior to Visit  Medication Sig Dispense Refill  . albuterol (PROVENTIL HFA;VENTOLIN HFA) 108 (90 BASE) MCG/ACT inhaler Inhale 2 puffs into the lungs every 4 (four) hours as needed for wheezing.  6.7 g  11  . aspirin (ANACIN) 81 MG EC tablet Take 1 tablet (81 mg total) by mouth daily.  30 tablet  11  . Blood Glucose Monitoring Suppl (ACCU-CHEK AVIVA PLUS) W/DEVICE KIT 1 kit by Does not apply route daily.  1 kit  0  . diltiazem (TIAZAC) 240 MG 24 hr capsule Take 1 capsule (240 mg total) by mouth daily.  30 capsule  11  . Fluticasone-Salmeterol (ADVAIR DISKUS) 250-50 MCG/DOSE AEPB Inhale 1 puff into the lungs 2 (two) times daily.  60 each  11  . glipiZIDE (GLUCOTROL) 10 MG tablet Take 1 tablet (10 mg total) by mouth 2 (two) times daily before a meal.  60 tablet  2  . glucose blood (ACCU-CHEK AVIVA) test strip 1 each by Other route daily.  100 each  3  . HYDROcodone-acetaminophen (NORCO) 7.5-325 MG per tablet Take 1 tablet by mouth every 6 (six) hours as needed for pain.  120 tablet  5  . Insulin Pen Needle (B-D UF III MINI PEN NEEDLES) 31G X 5 MM MISC 1 each by Does not apply route at bedtime.  100 each  3  . lisinopril (PRINIVIL,ZESTRIL) 2.5 MG tablet TAKE 1  TABLET BY MOUTH EVERY DAY  30 tablet  11  . metFORMIN (GLUCOPHAGE) 1000 MG tablet Take 1 tablet (1,000 mg total) by mouth 2 (two) times daily with a meal.  60 tablet  11  . pravastatin (PRAVACHOL) 40 MG tablet Take 1 tablet (40 mg total) by mouth every evening.  90 tablet  3  . tiotropium (SPIRIVA HANDIHALER) 18 MCG inhalation capsule Place 1 capsule (18 mcg total) into inhaler and inhale daily.  30 capsule  11   No current facility-administered medications on file prior to visit.    Assessment/Plan

## 2012-11-22 NOTE — Assessment & Plan Note (Signed)
This appears to have resolved, with a HR of 70 today.  This was thought to represent anxiety.

## 2012-12-01 ENCOUNTER — Telehealth: Payer: Self-pay | Admitting: Licensed Clinical Social Worker

## 2012-12-01 NOTE — Telephone Encounter (Signed)
CSW received faxed request for PCS from agency.  CSW placed call to [name] to determine what services he is in need of.  CSW placed called to pt.  CSW left message requesting return call. CSW provided contact hours and phone number.

## 2012-12-02 ENCOUNTER — Other Ambulatory Visit: Payer: Self-pay | Admitting: *Deleted

## 2012-12-02 MED ORDER — HYDROCODONE-ACETAMINOPHEN 7.5-325 MG PO TABS: 1.0000 | ORAL_TABLET | Freq: Four times a day (QID) | ORAL | Status: DC | PRN

## 2012-12-06 NOTE — Progress Notes (Signed)
Case discussed with Dr. Brown immediately after the resident saw the patient. We reviewed the resident's history and exam and pertinent patient test results. I agree with the assessment, diagnosis and plan of care documented in the resident's note. 

## 2012-12-10 NOTE — Telephone Encounter (Signed)
Pt returned call to CSW.  Pt states he is having increased difficulty and pain with knees during his ADL's.  CSW initiated request.

## 2012-12-30 ENCOUNTER — Other Ambulatory Visit: Payer: Self-pay

## 2013-01-10 ENCOUNTER — Telehealth: Payer: Self-pay | Admitting: *Deleted

## 2013-01-10 NOTE — Telephone Encounter (Signed)
Pt called - needs a note faxed to [phone] ( jury selection ) unable to do jury duty do to health problems - diabetes. Pt reported in today for jury duty and gave him 2 days for note to be faxed. Stanton Kidney Aubert Choyce RN 01/10/13 1:45PM

## 2013-01-11 ENCOUNTER — Encounter: Payer: Self-pay | Admitting: Internal Medicine

## 2013-01-11 NOTE — Telephone Encounter (Signed)
Letter has been faxed 01/11/13

## 2013-01-23 ENCOUNTER — Other Ambulatory Visit: Payer: Self-pay | Admitting: Internal Medicine

## 2013-02-04 ENCOUNTER — Other Ambulatory Visit: Payer: Self-pay | Admitting: Internal Medicine

## 2013-02-23 ENCOUNTER — Encounter: Payer: PRIVATE HEALTH INSURANCE | Admitting: Internal Medicine

## 2013-02-23 ENCOUNTER — Encounter: Payer: Self-pay | Admitting: Internal Medicine

## 2013-02-23 ENCOUNTER — Other Ambulatory Visit: Payer: Self-pay | Admitting: *Deleted

## 2013-02-23 DIAGNOSIS — E119 Type 2 diabetes mellitus without complications: Secondary | ICD-10-CM

## 2013-02-23 MED ORDER — GLIPIZIDE 10 MG PO TABS: 10.0000 mg | ORAL_TABLET | Freq: Two times a day (BID) | ORAL | Status: DC

## 2013-03-02 ENCOUNTER — Other Ambulatory Visit: Payer: Self-pay | Admitting: Internal Medicine

## 2013-03-23 ENCOUNTER — Encounter: Payer: Self-pay | Admitting: Internal Medicine

## 2013-03-23 ENCOUNTER — Ambulatory Visit (INDEPENDENT_AMBULATORY_CARE_PROVIDER_SITE_OTHER): Payer: PRIVATE HEALTH INSURANCE | Admitting: Internal Medicine

## 2013-03-23 VITALS — BP 121/73 | HR 69 | Temp 98.2°F | Ht 73.0 in | Wt 215.2 lb

## 2013-03-23 DIAGNOSIS — Z0000 Encounter for general adult medical examination without abnormal findings: Secondary | ICD-10-CM

## 2013-03-23 DIAGNOSIS — E119 Type 2 diabetes mellitus without complications: Secondary | ICD-10-CM

## 2013-03-23 DIAGNOSIS — Z23 Encounter for immunization: Secondary | ICD-10-CM

## 2013-03-23 DIAGNOSIS — I10 Essential (primary) hypertension: Secondary | ICD-10-CM

## 2013-03-23 LAB — GLUCOSE, CAPILLARY: Glucose-Capillary: 124 mg/dL — ABNORMAL HIGH (ref 70–99)

## 2013-03-23 NOTE — Progress Notes (Signed)
HPI The patient is a 62 y.o. male with a history of diabetes, hypertension, COPD, returning for a follow-up visit.  The patient has a history of diabetes, previously controlled with Levemir 15 units. This was discontinued at a prior visit, and the patient's last A1c was 8.0 in May. At that time glipizide and metformin doses were maximized, and, after significant diet and exercise changes, the patient's A1c has now decreased to 6.7. The patient brings his glucometer log, which shows no evidence of hypoglycemia, with a range from 80-203.  The patient has a history of hypertension. Blood pressure is well-controlled today.  The patient notes that he is recovering well from his recent surgery, and he is now walking without a cane.  ROS: General: no fevers, chills, changes in weight, changes in appetite Skin: no rash HEENT: no blurry vision, hearing changes, sore throat Pulm: no dyspnea, coughing, wheezing CV: no chest pain, palpitations, shortness of breath Abd: no abdominal pain, nausea/vomiting, diarrhea/constipation GU: no dysuria, hematuria, polyuria Ext: no arthralgias, myalgias Neuro: no weakness, numbness, or tingling  Filed Vitals:   03/23/13 1535  BP: 121/73  Pulse: 69  Temp: 98.2 F (36.8 C)    PEX General: alert, cooperative, and in no apparent distress HEENT: pupils equal round and reactive to light, vision grossly intact, oropharynx clear and non-erythematous  Neck: supple, no lymphadenopathy Lungs: clear to ascultation bilaterally, normal work of respiration, no wheezes, rales, ronchi Heart: regular rate and rhythm, no murmurs, gallops, or rubs Abdomen: soft, non-tender, non-distended, normal bowel sounds Extremities: 2+ DP/PT pulses bilaterally, no cyanosis, clubbing, or edema Neurologic: alert & oriented X3, cranial nerves II-XII intact, strength grossly intact, sensation intact to light touch  Current Outpatient Prescriptions on File Prior to Visit  Medication Sig  Dispense Refill  . albuterol (PROVENTIL HFA;VENTOLIN HFA) 108 (90 BASE) MCG/ACT inhaler Inhale 2 puffs into the lungs every 4 (four) hours as needed for wheezing.  6.7 g  11  . aspirin (ANACIN) 81 MG EC tablet Take 1 tablet (81 mg total) by mouth daily.  30 tablet  11  . Blood Glucose Monitoring Suppl (ACCU-CHEK AVIVA PLUS) W/DEVICE KIT 1 kit by Does not apply route daily.  1 kit  0  . diltiazem (TIAZAC) 240 MG 24 hr capsule Take 1 capsule (240 mg total) by mouth daily.  30 capsule  11  . Fluticasone-Salmeterol (ADVAIR DISKUS) 250-50 MCG/DOSE AEPB Inhale 1 puff into the lungs 2 (two) times daily.  60 each  11  . glipiZIDE (GLUCOTROL) 10 MG tablet Take 1 tablet (10 mg total) by mouth 2 (two) times daily before a meal.  180 tablet  3  . glucose blood (ACCU-CHEK AVIVA) test strip 1 each by Other route daily.  100 each  3  . HYDROcodone-acetaminophen (NORCO) 7.5-325 MG per tablet Take 1 tablet by mouth every 6 (six) hours as needed for pain.  120 tablet  5  . Insulin Pen Needle (B-D UF III MINI PEN NEEDLES) 31G X 5 MM MISC 1 each by Does not apply route at bedtime.  100 each  3  . lisinopril (PRINIVIL,ZESTRIL) 2.5 MG tablet TAKE 1 TABLET BY MOUTH EVERY DAY  30 tablet  11  . metFORMIN (GLUCOPHAGE) 1000 MG tablet Take 1 tablet (1,000 mg total) by mouth 2 (two) times daily with a meal.  60 tablet  11  . pravastatin (PRAVACHOL) 40 MG tablet Take 1 tablet (40 mg total) by mouth every evening.  90 tablet  3  . propranolol (  INDERAL) 40 MG tablet TAKE 1 TABLET BY MOUTH TWICE A DAY  60 tablet  8  . SPIRIVA HANDIHALER 18 MCG inhalation capsule PLACE 1 CAPSULE (18 MCG TOTAL) INTO INHALER AND INHALE DAILY.  30 capsule  11   No current facility-administered medications on file prior to visit.    Assessment/Plan

## 2013-03-23 NOTE — Patient Instructions (Addendum)
General Instructions: Your Hemoglobin A1C has decreased to 6.7 today, which is fantastic!  Continue to make healthy diet choices and exercise regularly.  Your blood pressure is well-controlled today.  You have received your flu shot today.  It is normal to have some arm soreness for a few days after receiving this shot.  Please return for a routine follow-up visit in 3-4 months.   Treatment Goals:  Goals (1 Years of Data) as of 03/23/13         As of Today 11/16/12 08/18/12 03/10/12     Diet    . Have 3 meals a day          Lifestyle    . Eat Lunch         . Increase physical activity          Result Component    . HEMOGLOBIN A1C < 7.0  6.7 8.0 7.3 6.8      Progress Toward Treatment Goals:  Treatment Goal 03/23/2013  Hemoglobin A1C at goal  Blood pressure at goal    Self Care Goals & Plans:  Self Care Goal 03/23/2013  Manage my medications take my medicines as prescribed; bring my medications to every visit; refill my medications on time  Monitor my health keep track of my blood glucose; bring my blood pressure log to each visit; check my feet daily  Eat healthy foods eat more vegetables; eat foods that are low in salt    Home Blood Glucose Monitoring 03/23/2013  Check my blood sugar once a day  When to check my blood sugar N/A; before meals     Care Management & Community Referrals:  Referral 03/23/2013  Referrals made for care management support none needed

## 2013-03-23 NOTE — Progress Notes (Signed)
Case discussed with Dr. Brown at time of visit.  We reviewed the resident's history and exam and pertinent patient test results.  I agree with the assessment, diagnosis, and plan of care documented in the resident's note. 

## 2013-03-23 NOTE — Assessment & Plan Note (Signed)
Flu shot given today

## 2013-03-23 NOTE — Assessment & Plan Note (Signed)
BP Readings from Last 3 Encounters:  03/23/13 121/73  11/22/12 115/73  11/16/12 100/60    Lab Results  Component Value Date   NA 144 08/18/2012   K 3.8 08/18/2012   CREATININE 0.95 08/18/2012    Assessment: Blood pressure control: controlled Progress toward BP goal:  at goal Comments: BP well-controlled  Plan: Medications:  continue current medications Educational resources provided:   Self management tools provided:   Other plans: Recheck at next visit

## 2013-03-23 NOTE — Assessment & Plan Note (Signed)
Lab Results  Component Value Date   HGBA1C 6.7 03/23/2013   HGBA1C 8.0 11/16/2012   HGBA1C 7.3 08/18/2012     Assessment: Diabetes control: good control (HgbA1C at goal) Progress toward A1C goal:  at goal Comments: The patient's A1c has improved to 6.7, with only oral hypoglycemics, without Levemir. I attribute this mostly to the patient's diet and exercise changes.  Plan: Medications:  Continue metformin and glipizide Home glucose monitoring: Frequency: once a day Timing: N/A;before meals Instruction/counseling given: reminded to bring blood glucose meter & log to each visit Educational resources provided:   Self management tools provided: copy of home glucose meter download Other plans: Foot exam performed today

## 2013-04-05 ENCOUNTER — Other Ambulatory Visit: Payer: Self-pay | Admitting: Internal Medicine

## 2013-04-05 MED ORDER — HYDROCODONE-ACETAMINOPHEN 7.5-325 MG PO TABS: 1.0000 | ORAL_TABLET | Freq: Four times a day (QID) | ORAL | Status: DC | PRN

## 2013-04-23 ENCOUNTER — Other Ambulatory Visit: Payer: Self-pay | Admitting: Internal Medicine

## 2013-06-03 ENCOUNTER — Telehealth: Payer: Self-pay | Admitting: *Deleted

## 2013-06-03 NOTE — Telephone Encounter (Signed)
Pt states he will be out of town this weekend, can he fill his pain med early, today? Please advise asap

## 2013-06-06 NOTE — Telephone Encounter (Signed)
I apologize for the delay.  It is ok for this patient to fill this medication early.  Thank you.

## 2013-07-05 ENCOUNTER — Other Ambulatory Visit: Payer: Self-pay | Admitting: *Deleted

## 2013-07-06 ENCOUNTER — Other Ambulatory Visit: Payer: Self-pay | Admitting: Internal Medicine

## 2013-07-06 MED ORDER — HYDROCODONE-ACETAMINOPHEN 7.5-325 MG PO TABS: 1.0000 | ORAL_TABLET | Freq: Four times a day (QID) | ORAL | Status: DC | PRN

## 2013-09-21 ENCOUNTER — Encounter: Payer: PRIVATE HEALTH INSURANCE | Admitting: Internal Medicine

## 2013-10-03 ENCOUNTER — Other Ambulatory Visit: Payer: Self-pay | Admitting: *Deleted

## 2013-10-03 MED ORDER — HYDROCODONE-ACETAMINOPHEN 7.5-325 MG PO TABS: 1.0000 | ORAL_TABLET | Freq: Four times a day (QID) | ORAL | Status: DC | PRN

## 2013-10-03 NOTE — Telephone Encounter (Signed)
Pt informed Rx are ready 

## 2013-10-19 ENCOUNTER — Ambulatory Visit (INDEPENDENT_AMBULATORY_CARE_PROVIDER_SITE_OTHER): Payer: PRIVATE HEALTH INSURANCE | Admitting: Internal Medicine

## 2013-10-19 ENCOUNTER — Encounter: Payer: Self-pay | Admitting: Internal Medicine

## 2013-10-19 VITALS — BP 154/86 | HR 74 | Temp 97.5°F | Ht 73.0 in | Wt 223.5 lb

## 2013-10-19 DIAGNOSIS — I10 Essential (primary) hypertension: Secondary | ICD-10-CM

## 2013-10-19 DIAGNOSIS — E119 Type 2 diabetes mellitus without complications: Secondary | ICD-10-CM

## 2013-10-19 LAB — POCT GLYCOSYLATED HEMOGLOBIN (HGB A1C): Hemoglobin A1C: 7.7

## 2013-10-19 LAB — GLUCOSE, CAPILLARY: Glucose-Capillary: 156 mg/dL — ABNORMAL HIGH (ref 70–99)

## 2013-10-19 MED ORDER — LISINOPRIL 10 MG PO TABS: 10.0000 mg | ORAL_TABLET | Freq: Every day | ORAL | Status: DC

## 2013-10-19 NOTE — Assessment & Plan Note (Signed)
BP Readings from Last 3 Encounters:  10/19/13 154/86  03/23/13 121/73  11/22/12 115/73    Lab Results  Component Value Date   NA 144 08/18/2012   K 3.8 08/18/2012   CREATININE 0.95 08/18/2012    Assessment: Blood pressure control: mildly elevated Progress toward BP goal:  deteriorated Comments: The patient's blood pressure is mildly elevated today. Looking at the patient's medication regimen, I believe we started with propranolol for essential tremor, then added diltiazem for blood pressure control. Lisinopril was later added to treat microalbuminuria. However, none of these medications are at their maximum dose. In the long run, I believe we can increase lisinopril, and eventually discontinue diltiazem. However, today in the setting of acutely elevated blood pressure, we will just increase lisinopril, and defer down titrating of diltiazem for a future visit.  Plan: Medications:  Increase lisinopril from 2.5 to 10 mg daily.  Continue propranolol and diltiazem. Educational resources provided:   Self management tools provided:   Other plans: Pt to return in 3-4 weeks for a lab only visit for BMET, after increasing lisinopril.

## 2013-10-19 NOTE — Patient Instructions (Signed)
General Instructions: Your blood pressure is mildly elevated today -we are increasing your Lisinopril medication to 10 mg, once per day -continue to take your Propranolol and Diltiazem  For your Diabetes -take Glipizide, 1 tablet twice per day -take Metformin, 1 tablet twice per day -we discussed the importance of diet and exercise changes today -at your next visit, we will check a urine sample for protein in the urine  Please return for labs in about 2-3 weeks.  Call ahead to make a walk-in appointment.  Please return for a follow-up visit in 2-3 months.   Treatment Goals:  Goals (1 Years of Data) as of 10/19/13         As of Today 03/23/13 11/16/12 08/18/12     Diet    . Have 3 meals a day          Lifestyle    . Eat Lunch         . Increase physical activity          Result Component    . HEMOGLOBIN A1C < 7.0  7.7 6.7 8.0 7.3      Progress Toward Treatment Goals:  Treatment Goal 10/19/2013  Hemoglobin A1C deteriorated  Blood pressure deteriorated    Self Care Goals & Plans:  Self Care Goal 10/19/2013  Manage my medications take my medicines as prescribed; bring my medications to every visit; refill my medications on time  Monitor my health keep track of my blood glucose; bring my glucose meter and log to each visit; check my feet daily  Eat healthy foods eat foods that are low in salt; eat baked foods instead of fried foods  Be physically active take a walk every day    Home Blood Glucose Monitoring 03/23/2013  Check my blood sugar once a day  When to check my blood sugar N/A; before meals     Care Management & Community Referrals:  Referral 10/19/2013  Referrals made for care management support none needed

## 2013-10-19 NOTE — Assessment & Plan Note (Signed)
Lab Results  Component Value Date   HGBA1C 7.7 10/19/2013   HGBA1C 6.7 03/23/2013   HGBA1C 8.0 11/16/2012     Assessment: Diabetes control: fair control Progress toward A1C goal:  deteriorated Comments: A1C has rise, likely due to diet/exercise changes, as well as possibly due to underdosing of glipizide  Plan: Medications:  Continue metformin 1000 mg twice per day. Continue glipizide 10 mg twice per day. Continue diet and exercise changes.  Consider adding an additional medication such as Januvia at his next visit if A1c is not well controlled Home glucose monitoring: Frequency:   Timing:   Instruction/counseling given: reminded to bring blood glucose meter & log to each visit Educational resources provided: handout Self management tools provided:   Other plans: Pt unable to produce specimen for urine microalbumin today, will try at next visit

## 2013-10-19 NOTE — Progress Notes (Signed)
HPI The patient is a 63 y.o. male with a history of DM2, HTN, COPD, chronic lumbosacral radiculopathy, coming for a follow-up visit for DM.  The patient has a history of DM.  His A1C has increased from 6.7 to 7.7.  The patient attributes this to being less able to exercise in the cold weather, and dietary indiscretions over the winter. He notes full compliance with metformin, but believes he has been taking glipizide only once per day, instead of twice per day. He notes no episodes of symptomatic hypoglycemia or hyperglycemia. He did not bring his glucometer today.  BP mildly elevated today.  He notes compliance with diltiazem, lisinopril, and propranolol.  ROS: General: no fevers, chills, changes in weight, changes in appetite Skin: no rash HEENT: no blurry vision, hearing changes, sore throat Pulm: no dyspnea, coughing, wheezing CV: no chest pain, palpitations, shortness of breath Abd: no abdominal pain, nausea/vomiting, diarrhea/constipation GU: no dysuria, hematuria, polyuria Ext: no arthralgias, myalgias Neuro: no weakness, numbness, or tingling  Filed Vitals:   10/19/13 0930  BP: 154/86  Pulse: 74  Temp: 97.5 F (36.4 C)    PEX General: alert, cooperative, and in no apparent distress HEENT: pupils equal round and reactive to light, vision grossly intact, oropharynx clear and non-erythematous  Neck: supple Lungs: clear to ascultation bilaterally, normal work of respiration, no wheezes, rales, ronchi Heart: regular rate and rhythm, no murmurs, gallops, or rubs Abdomen: soft, non-tender, non-distended, normal bowel sounds Extremities: no cyanosis, clubbing, or edema Neurologic: alert & oriented X3, cranial nerves II-XII intact, strength grossly intact, sensation intact to light touch  Current Outpatient Prescriptions on File Prior to Visit  Medication Sig Dispense Refill  . ADVAIR DISKUS 250-50 MCG/DOSE AEPB INHALE 1 PUFF 2 TIMES DAILY.  60 each  11  . albuterol (PROVENTIL  HFA;VENTOLIN HFA) 108 (90 BASE) MCG/ACT inhaler Inhale 2 puffs into the lungs every 4 (four) hours as needed for wheezing.  6.7 g  11  . aspirin (ANACIN) 81 MG EC tablet Take 1 tablet (81 mg total) by mouth daily.  30 tablet  11  . Blood Glucose Monitoring Suppl (ACCU-CHEK AVIVA PLUS) W/DEVICE KIT 1 kit by Does not apply route daily.  1 kit  0  . diltiazem (TIAZAC) 240 MG 24 hr capsule Take 1 capsule (240 mg total) by mouth daily.  30 capsule  11  . glipiZIDE (GLUCOTROL) 10 MG tablet Take 1 tablet (10 mg total) by mouth 2 (two) times daily before a meal.  180 tablet  3  . glucose blood (ACCU-CHEK AVIVA) test strip 1 each by Other route daily.  100 each  3  . HYDROcodone-acetaminophen (NORCO) 7.5-325 MG per tablet Take 1 tablet by mouth every 6 (six) hours as needed.  120 tablet  0  . Insulin Pen Needle (B-D UF III MINI PEN NEEDLES) 31G X 5 MM MISC 1 each by Does not apply route at bedtime.  100 each  3  . lisinopril (PRINIVIL,ZESTRIL) 2.5 MG tablet TAKE 1 TABLET BY MOUTH EVERY DAY  30 tablet  11  . metFORMIN (GLUCOPHAGE) 1000 MG tablet Take 1 tablet (1,000 mg total) by mouth 2 (two) times daily with a meal.  60 tablet  11  . pravastatin (PRAVACHOL) 40 MG tablet Take 1 tablet (40 mg total) by mouth every evening.  90 tablet  3  . propranolol (INDERAL) 40 MG tablet TAKE 1 TABLET BY MOUTH TWICE A DAY  60 tablet  8  . SPIRIVA HANDIHALER 18 MCG  inhalation capsule PLACE 1 CAPSULE (18 MCG TOTAL) INTO INHALER AND INHALE DAILY.  30 capsule  11   No current facility-administered medications on file prior to visit.    Assessment/Plan

## 2013-10-20 NOTE — Progress Notes (Signed)
INTERNAL MEDICINE TEACHING ATTENDING ADDENDUM - Rani Sisney, MD: I reviewed and discussed at the time of visit with the resident Dr. Brown, the patient's medical history, physical examination, diagnosis and results of tests and treatment and I agree with the patient's care as documented. 

## 2013-10-26 ENCOUNTER — Other Ambulatory Visit: Payer: Self-pay | Admitting: Internal Medicine

## 2013-11-02 ENCOUNTER — Other Ambulatory Visit (INDEPENDENT_AMBULATORY_CARE_PROVIDER_SITE_OTHER): Payer: PRIVATE HEALTH INSURANCE

## 2013-11-02 DIAGNOSIS — I10 Essential (primary) hypertension: Secondary | ICD-10-CM

## 2013-11-02 LAB — BASIC METABOLIC PANEL
BUN: 12 mg/dL (ref 6–23)
CHLORIDE: 105 meq/L (ref 96–112)
CO2: 24 meq/L (ref 19–32)
Calcium: 9.6 mg/dL (ref 8.4–10.5)
Creat: 0.84 mg/dL (ref 0.50–1.35)
Glucose, Bld: 195 mg/dL — ABNORMAL HIGH (ref 70–99)
Potassium: 4.4 mEq/L (ref 3.5–5.3)
Sodium: 142 mEq/L (ref 135–145)

## 2013-11-30 ENCOUNTER — Other Ambulatory Visit: Payer: Self-pay | Admitting: Internal Medicine

## 2013-11-30 ENCOUNTER — Encounter: Payer: Self-pay | Admitting: Internal Medicine

## 2013-11-30 ENCOUNTER — Encounter: Payer: PRIVATE HEALTH INSURANCE | Admitting: Internal Medicine

## 2013-12-15 ENCOUNTER — Other Ambulatory Visit: Payer: Self-pay | Admitting: *Deleted

## 2013-12-15 MED ORDER — METFORMIN HCL 1000 MG PO TABS: 1000.0000 mg | ORAL_TABLET | Freq: Two times a day (BID) | ORAL | Status: DC

## 2014-01-03 ENCOUNTER — Other Ambulatory Visit: Payer: Self-pay | Admitting: *Deleted

## 2014-01-04 NOTE — Telephone Encounter (Signed)
Dr Daryll Drown told me interns were not filling Rx yet, is that correct?  Thank you, Quita Skye

## 2014-01-05 MED ORDER — HYDROCODONE-ACETAMINOPHEN 7.5-325 MG PO TABS: 1.0000 | ORAL_TABLET | Freq: Four times a day (QID) | ORAL | Status: DC | PRN

## 2014-01-05 NOTE — Telephone Encounter (Signed)
i send the request to both the first year and the attending just so you can become accustom to seeing the request and you might become familiar with pt's on pain meds also

## 2014-01-05 NOTE — Telephone Encounter (Signed)
Refill printed and signed and provided to refill nurse. 

## 2014-02-01 ENCOUNTER — Encounter: Payer: Self-pay | Admitting: Internal Medicine

## 2014-02-01 ENCOUNTER — Ambulatory Visit (INDEPENDENT_AMBULATORY_CARE_PROVIDER_SITE_OTHER): Payer: Commercial Managed Care - HMO | Admitting: Internal Medicine

## 2014-02-01 VITALS — BP 142/87 | HR 75 | Temp 98.6°F | Ht 73.0 in | Wt 218.1 lb

## 2014-02-01 DIAGNOSIS — I10 Essential (primary) hypertension: Secondary | ICD-10-CM

## 2014-02-01 DIAGNOSIS — J449 Chronic obstructive pulmonary disease, unspecified: Secondary | ICD-10-CM

## 2014-02-01 DIAGNOSIS — E119 Type 2 diabetes mellitus without complications: Secondary | ICD-10-CM

## 2014-02-01 DIAGNOSIS — E785 Hyperlipidemia, unspecified: Secondary | ICD-10-CM

## 2014-02-01 DIAGNOSIS — Z0000 Encounter for general adult medical examination without abnormal findings: Secondary | ICD-10-CM

## 2014-02-01 DIAGNOSIS — G252 Other specified forms of tremor: Secondary | ICD-10-CM

## 2014-02-01 DIAGNOSIS — G250 Essential tremor: Secondary | ICD-10-CM

## 2014-02-01 LAB — POCT GLYCOSYLATED HEMOGLOBIN (HGB A1C): HEMOGLOBIN A1C: 8

## 2014-02-01 LAB — LIPID PANEL
CHOL/HDL RATIO: 3.9 ratio
Cholesterol: 147 mg/dL (ref 0–200)
HDL: 38 mg/dL — AB (ref >39)
LDL CALC: 63 mg/dL (ref 0–99)
Triglycerides: 228 mg/dL — ABNORMAL HIGH (ref <150)
VLDL: 46 mg/dL — ABNORMAL HIGH (ref 0–40)

## 2014-02-01 LAB — GLUCOSE, CAPILLARY: Glucose-Capillary: 135 mg/dL — ABNORMAL HIGH (ref 70–99)

## 2014-02-01 MED ORDER — LISINOPRIL 10 MG PO TABS: 20.0000 mg | ORAL_TABLET | Freq: Every day | ORAL | Status: DC

## 2014-02-01 MED ORDER — GLIPIZIDE 10 MG PO TABS: 10.0000 mg | ORAL_TABLET | Freq: Two times a day (BID) | ORAL | Status: DC

## 2014-02-01 MED ORDER — GLIPIZIDE 10 MG PO TABS: ORAL_TABLET | ORAL | Status: DC

## 2014-02-01 MED ORDER — LISINOPRIL 10 MG PO TABS
20.0000 mg | ORAL_TABLET | Freq: Every day | ORAL | Status: DC
Start: 2014-02-01 — End: 2015-04-27

## 2014-02-01 NOTE — Assessment & Plan Note (Signed)
Patient essential tremor is stable. Patient is unsure if the propanolol is helping. -continue propanolol 40 mg daily and consider wean if BP is under better control

## 2014-02-01 NOTE — Addendum Note (Signed)
Addended by: Oval Linsey D on: 02/01/2014 06:31 PM   Modules accepted: Level of Service

## 2014-02-01 NOTE — Assessment & Plan Note (Signed)
Patient given prescription for Zostavax. He said he would take it to local drug store.

## 2014-02-01 NOTE — Assessment & Plan Note (Signed)
Patient has no complaints and lungs are CTAB. -continue spiriva and advair as prescribed

## 2014-02-01 NOTE — Progress Notes (Signed)
I saw and evaluated the patient. I personally confirmed the key portions of Dr. Rothman's history and exam and reviewed pertinent patient test results. The assessment, diagnosis, and plan were formulated together and I agree with the documentation in the resident's note. 

## 2014-02-01 NOTE — Assessment & Plan Note (Signed)
Lab Results  Component Value Date   HGBA1C 8.0 02/01/2014   HGBA1C 7.7 10/19/2013   HGBA1C 6.7 03/23/2013     Assessment: Diabetes control: fair control Progress toward A1C goal:  deteriorated Comments: A1c is trending up. I do not think noncompliance is the issue as he remembers his regimen from memory. He did not bring glucometer today but says BG is generally in 130s. He thinks he has been exercising less due to summer heat.  Plan: Medications:  Continue metformin 1000 mg BID. We will increase glipizide to 20 mg in the morning and 10 mg in the evening and reassess in 3 months Home glucose monitoring: Frequency:   Timing:   Instruction/counseling given: reminded to get eye exam, reminded to bring blood glucose meter & log to each visit and discussed diet Educational resources provided:   Self management tools provided:   Other plans: Eye exam scheduled for 05/2014 with [facility].

## 2014-02-01 NOTE — Assessment & Plan Note (Signed)
Patient last lipid panel was 10/2012 with LDL 49. -recheck lipid panel and continue pravastatin 40 mg daily for now

## 2014-02-01 NOTE — Progress Notes (Signed)
   Subjective:    Patient ID: [name], male    DOB: [birth year], 63 y.o.   MRN: [id number]  Diabetes Hypoglycemia symptoms include tremors. Pertinent negatives for hypoglycemia include no dizziness or headaches. Pertinent negatives for diabetes include no chest pain and no weakness.  Knee Pain  Pertinent negatives include no numbness.    [name] is a 63 year old man with DM2, HTN, COPD, and HL who presents for routine follow-up. He has no complaints today. He does not have his glucometer with him but reports his sugars have generally been in the 130s at home. He has been less active due to the hot weather and thinks his diet is unchanged. He can recall his medication regimens from memory and denies any compliance issues. His chronic R knee pain is stable.  Review of Systems  Constitutional: Positive for activity change. Negative for fever, chills, diaphoresis and appetite change.  Respiratory: Negative for chest tightness and shortness of breath.   Cardiovascular: Negative for chest pain and palpitations.  Gastrointestinal: Negative for nausea, vomiting, abdominal pain, diarrhea and constipation.  Musculoskeletal: Positive for arthralgias.       Stable R knee pain  Neurological: Positive for tremors. Negative for dizziness, syncope, weakness, numbness and headaches.       Objective:   Physical Exam  Constitutional: He is oriented to person, place, and time. He appears well-developed and well-nourished. No distress.  HENT:  Head: Normocephalic and atraumatic.  Mouth/Throat: Oropharynx is clear and moist.  Eyes: EOM are normal. Pupils are equal, round, and reactive to light. No scleral icterus.  Cardiovascular: Normal rate, regular rhythm, normal heart sounds and intact distal pulses.  Exam reveals no gallop and no friction rub.   No murmur heard. Pulmonary/Chest: Effort normal and breath sounds normal. No respiratory distress.  Abdominal: Soft. Bowel sounds are normal.  He exhibits no distension. There is no tenderness.  Musculoskeletal: He exhibits no edema.  Neurological: He is alert and oriented to person, place, and time. He has normal reflexes. No cranial nerve deficit. Coordination normal.  Skin: He is not diaphoretic.  Psychiatric: He has a normal mood and affect. His behavior is normal. Judgment and thought content normal.          Assessment & Plan:

## 2014-02-01 NOTE — Assessment & Plan Note (Signed)
BP Readings from Last 3 Encounters:  02/01/14 142/87  10/19/13 154/86  03/23/13 121/73    Lab Results  Component Value Date   NA 142 11/02/2013   K 4.4 11/02/2013   CREATININE 0.84 11/02/2013    Assessment: Blood pressure control: mildly elevated Progress toward BP goal:  deteriorated Comments: Mildly elevated but still over goal  Plan: Medications:  We will increase lisinopril to 20 mg daily and continue diltiazem 240 mg daily and propanolol 40 mg daily.  Educational resources provided:   none Self management tools provided:   Encouraged exercise and patient says he will when weather is better Other plans: If BP is under control in future, we will consider weaning off propanolol

## 2014-02-01 NOTE — Patient Instructions (Signed)
It was a pleasure to meet you today. As we discussed, your average blood sugar (hemoglobin A1c) is increasing. We have changed your medications so that you will now take 2 tabs (20 mg) of glipizide in the morning and 1 tab (10 mg) in the evening. You will also now take two tabs (20 mg) of lisinopril daily. We look forward to seeing you again in three months to reassess your blood sugars and blood pressure. I have also given you a prescription for the Zostavax to take to your local pharmacy.

## 2014-02-02 ENCOUNTER — Other Ambulatory Visit: Payer: Self-pay | Admitting: *Deleted

## 2014-02-03 MED ORDER — HYDROCODONE-ACETAMINOPHEN 7.5-325 MG PO TABS: 1.0000 | ORAL_TABLET | Freq: Four times a day (QID) | ORAL | Status: DC | PRN

## 2014-02-08 ENCOUNTER — Telehealth: Payer: Self-pay | Admitting: Internal Medicine

## 2014-02-08 MED ORDER — GEMFIBROZIL 600 MG PO TABS: 600.0000 mg | ORAL_TABLET | Freq: Two times a day (BID) | ORAL | Status: DC

## 2014-02-08 NOTE — Telephone Encounter (Signed)
I called Mr [name] today at 1pm to share the results of his lab draw from last week. We discussed that his cholesterol is well controlled but his triglyceride is high. I told him I would call in a new prescription for gemfibrozil 600 mg BID 30 min before meals to take to lower triglyceride level. He had no questions.  Lottie Mussel, MD 02/08/14 1:03PM

## 2014-02-13 ENCOUNTER — Ambulatory Visit (INDEPENDENT_AMBULATORY_CARE_PROVIDER_SITE_OTHER): Payer: Commercial Managed Care - HMO | Admitting: Internal Medicine

## 2014-02-13 ENCOUNTER — Encounter: Payer: Self-pay | Admitting: Internal Medicine

## 2014-02-13 VITALS — BP 128/73 | HR 71 | Temp 98.2°F | Ht 73.0 in | Wt 218.2 lb

## 2014-02-13 DIAGNOSIS — R220 Localized swelling, mass and lump, head: Secondary | ICD-10-CM

## 2014-02-13 DIAGNOSIS — R221 Localized swelling, mass and lump, neck: Secondary | ICD-10-CM | POA: Insufficient documentation

## 2014-02-13 LAB — GLUCOSE, CAPILLARY: GLUCOSE-CAPILLARY: 116 mg/dL — AB (ref 70–99)

## 2014-02-13 MED ORDER — DOXYCYCLINE HYCLATE 100 MG PO TABS
100.0000 mg | ORAL_TABLET | Freq: Two times a day (BID) | ORAL | Status: DC
Start: 2014-02-13 — End: 2015-02-21

## 2014-02-13 NOTE — Progress Notes (Signed)
   Subjective:    Patient ID: [name], male    DOB: 1950-09-12, 63 y.o.   MRN: [id number]  HPI [name] is a 63yo man w/ PMHx of HTN, COPD, Type 2 DM, HLD, and osteoarthritis who presents today with the complaint of "a knot on my neck." Pt reports he was shaving yesterday when he noticed a small "knot" on the right side of his neck. He notes the nodule was located under his right ear and has now moved closer to the right side of his jaw. He notes the nodule is painful to touch. He denies fever, chills, night sweats, recent illness, changes in weight, changes in appetite, difficulty swallowing or pain with swallowing, and rashes. Pt denies any bug bites or hiking in the woods recently.    Review of Systems General: See HPI HEENT: Denies headaches, ear pain, changes in vision, rhinorrhea, sore throat CV: Denies CP, palpitations, SOB, orthopnea Pulm: Denies SOB, cough, wheezing GI: Denies abdominal pain, nausea, vomiting, diarrhea, constipation, melena, hematochezia GU: Denies dysuria, hematuria, frequency Msk: Denies muscle cramps, joint pains Neuro: Denies weakness, numbness, tingling Skin: Denies bruising    Objective:   Physical Exam General: appears stated age, sitting up in chair, NAD HEENT: [place]/AT, EOMI, PERRL, sclera anicteric, pharynx non-erythematous, no abscesses in oral cavity present, mucus membranes moist Neck: supple, no JVD, no lymphadenopathy. Pt has a small 1-2 cm, fluctuant, soft nodule near his right mandible. Nodule is tender to palpation. There is a smaller papule located more laterally to the nodule.  CV: RRR, normal S1/S2, no m/g/r Pulm: CTA bilaterally, breaths non-labored, no wheezing Abd: BS+, soft, non-distended, non-tender Ext: warm, no edema, moves all, no axillary lymphadenopathy present Neuro: alert and oriented x 3, CNs II-XII intact, strength 5/5 in upper and lower extremities bilaterally       Assessment & Plan:

## 2014-02-13 NOTE — Patient Instructions (Signed)
It was a pleasure taking care of you today, [name].  1. Neck nodule - Likely small Staph infection - Start Doxycycline 100 mg twice a day for 7 days - Come back to clinic in 1 week  General Instructions:   Please bring your medicines with you each time you come to clinic.  Medicines may include prescription medications, over-the-counter medications, herbal remedies, eye drops, vitamins, or other pills.   Progress Toward Treatment Goals:  Treatment Goal 02/01/2014  Hemoglobin A1C deteriorated  Blood pressure deteriorated    Self Care Goals & Plans:  Self Care Goal 02/13/2014  Manage my medications take my medicines as prescribed; bring my medications to every visit; refill my medications on time  Monitor my health keep track of my blood glucose; bring my glucose meter and log to each visit; check my feet daily  Eat healthy foods eat more vegetables; eat foods that are low in salt; eat baked foods instead of fried foods  Be physically active find an activity I enjoy    Home Blood Glucose Monitoring 03/23/2013  Check my blood sugar once a day  When to check my blood sugar N/A; before meals     Care Management & Community Referrals:  Referral 10/19/2013  Referrals made for care management support none needed

## 2014-02-13 NOTE — Assessment & Plan Note (Signed)
Nodule likely soft tissue infection. Given patient has diabetes will cover for MRSA infection. - Doxycycline 100 mg BID for 7 days - Follow up in 1 week

## 2014-02-14 NOTE — Progress Notes (Signed)
Internal Medicine Clinic Attending Date of visit: 02/13/2014  I saw and evaluated the patient.  I personally confirmed the key portions of the history and exam documented by Dr. Arcelia Jew and I reviewed pertinent patient test results.  The assessment, diagnosis, and plan were formulated together and I agree with the documentation in the resident's note.

## 2014-02-17 ENCOUNTER — Other Ambulatory Visit: Payer: Self-pay | Admitting: *Deleted

## 2014-02-19 MED ORDER — PROPRANOLOL HCL 40 MG PO TABS: ORAL_TABLET | ORAL | Status: DC

## 2014-03-06 ENCOUNTER — Other Ambulatory Visit: Payer: Self-pay | Admitting: *Deleted

## 2014-03-08 MED ORDER — HYDROCODONE-ACETAMINOPHEN 7.5-325 MG PO TABS: 1.0000 | ORAL_TABLET | Freq: Four times a day (QID) | ORAL | Status: DC | PRN

## 2014-04-05 ENCOUNTER — Other Ambulatory Visit: Payer: Self-pay | Admitting: *Deleted

## 2014-04-05 ENCOUNTER — Other Ambulatory Visit: Payer: Self-pay | Admitting: Internal Medicine

## 2014-04-05 MED ORDER — HYDROCODONE-ACETAMINOPHEN 7.5-325 MG PO TABS: 1.0000 | ORAL_TABLET | Freq: Four times a day (QID) | ORAL | Status: DC | PRN

## 2014-04-05 NOTE — Telephone Encounter (Signed)
Refill due on 10/16 Call pt when ready

## 2014-04-17 ENCOUNTER — Other Ambulatory Visit: Payer: Self-pay | Admitting: *Deleted

## 2014-04-17 MED ORDER — TIOTROPIUM BROMIDE MONOHYDRATE 18 MCG IN CAPS: ORAL_CAPSULE | RESPIRATORY_TRACT | Status: DC

## 2014-04-20 ENCOUNTER — Ambulatory Visit (INDEPENDENT_AMBULATORY_CARE_PROVIDER_SITE_OTHER): Payer: Commercial Managed Care - HMO | Admitting: *Deleted

## 2014-04-20 DIAGNOSIS — Z23 Encounter for immunization: Secondary | ICD-10-CM

## 2014-05-02 ENCOUNTER — Encounter: Payer: Commercial Managed Care - HMO | Admitting: Internal Medicine

## 2014-05-05 ENCOUNTER — Encounter: Payer: Commercial Managed Care - HMO | Admitting: Internal Medicine

## 2014-05-11 ENCOUNTER — Other Ambulatory Visit: Payer: Self-pay | Admitting: *Deleted

## 2014-05-11 MED ORDER — FLUTICASONE-SALMETEROL 250-50 MCG/DOSE IN AEPB: 1.0000 | INHALATION_SPRAY | Freq: Two times a day (BID) | RESPIRATORY_TRACT | Status: DC

## 2014-06-20 ENCOUNTER — Other Ambulatory Visit: Payer: Self-pay | Admitting: Oncology

## 2014-06-26 DIAGNOSIS — M1990 Unspecified osteoarthritis, unspecified site: Secondary | ICD-10-CM | POA: Diagnosis not present

## 2014-06-26 DIAGNOSIS — G250 Essential tremor: Secondary | ICD-10-CM | POA: Diagnosis not present

## 2014-06-26 DIAGNOSIS — G252 Other specified forms of tremor: Secondary | ICD-10-CM | POA: Diagnosis not present

## 2014-06-27 DIAGNOSIS — G252 Other specified forms of tremor: Secondary | ICD-10-CM | POA: Diagnosis not present

## 2014-06-27 DIAGNOSIS — G250 Essential tremor: Secondary | ICD-10-CM | POA: Diagnosis not present

## 2014-06-27 DIAGNOSIS — M1990 Unspecified osteoarthritis, unspecified site: Secondary | ICD-10-CM | POA: Diagnosis not present

## 2014-06-28 ENCOUNTER — Encounter: Payer: Self-pay | Admitting: Internal Medicine

## 2014-06-28 ENCOUNTER — Ambulatory Visit: Payer: Commercial Managed Care - HMO | Admitting: Internal Medicine

## 2014-06-28 DIAGNOSIS — G250 Essential tremor: Secondary | ICD-10-CM | POA: Diagnosis not present

## 2014-06-28 DIAGNOSIS — G252 Other specified forms of tremor: Secondary | ICD-10-CM | POA: Diagnosis not present

## 2014-06-28 DIAGNOSIS — M1990 Unspecified osteoarthritis, unspecified site: Secondary | ICD-10-CM | POA: Diagnosis not present

## 2014-06-29 DIAGNOSIS — G250 Essential tremor: Secondary | ICD-10-CM | POA: Diagnosis not present

## 2014-06-29 DIAGNOSIS — G252 Other specified forms of tremor: Secondary | ICD-10-CM | POA: Diagnosis not present

## 2014-06-29 DIAGNOSIS — M1990 Unspecified osteoarthritis, unspecified site: Secondary | ICD-10-CM | POA: Diagnosis not present

## 2014-06-30 DIAGNOSIS — G250 Essential tremor: Secondary | ICD-10-CM | POA: Diagnosis not present

## 2014-06-30 DIAGNOSIS — M1990 Unspecified osteoarthritis, unspecified site: Secondary | ICD-10-CM | POA: Diagnosis not present

## 2014-06-30 DIAGNOSIS — G252 Other specified forms of tremor: Secondary | ICD-10-CM | POA: Diagnosis not present

## 2014-07-03 DIAGNOSIS — G250 Essential tremor: Secondary | ICD-10-CM | POA: Diagnosis not present

## 2014-07-03 DIAGNOSIS — G252 Other specified forms of tremor: Secondary | ICD-10-CM | POA: Diagnosis not present

## 2014-07-03 DIAGNOSIS — M1990 Unspecified osteoarthritis, unspecified site: Secondary | ICD-10-CM | POA: Diagnosis not present

## 2014-07-04 DIAGNOSIS — M1990 Unspecified osteoarthritis, unspecified site: Secondary | ICD-10-CM | POA: Diagnosis not present

## 2014-07-04 DIAGNOSIS — G252 Other specified forms of tremor: Secondary | ICD-10-CM | POA: Diagnosis not present

## 2014-07-04 DIAGNOSIS — G250 Essential tremor: Secondary | ICD-10-CM | POA: Diagnosis not present

## 2014-07-05 DIAGNOSIS — G252 Other specified forms of tremor: Secondary | ICD-10-CM | POA: Diagnosis not present

## 2014-07-05 DIAGNOSIS — M1990 Unspecified osteoarthritis, unspecified site: Secondary | ICD-10-CM | POA: Diagnosis not present

## 2014-07-05 DIAGNOSIS — G250 Essential tremor: Secondary | ICD-10-CM | POA: Diagnosis not present

## 2014-07-06 ENCOUNTER — Ambulatory Visit (INDEPENDENT_AMBULATORY_CARE_PROVIDER_SITE_OTHER): Payer: Commercial Managed Care - HMO | Admitting: Internal Medicine

## 2014-07-06 ENCOUNTER — Encounter: Payer: Self-pay | Admitting: Internal Medicine

## 2014-07-06 VITALS — BP 150/80 | HR 82 | Temp 98.0°F | Ht 73.0 in | Wt 214.9 lb

## 2014-07-06 DIAGNOSIS — G250 Essential tremor: Secondary | ICD-10-CM | POA: Diagnosis not present

## 2014-07-06 DIAGNOSIS — E785 Hyperlipidemia, unspecified: Secondary | ICD-10-CM

## 2014-07-06 DIAGNOSIS — M170 Bilateral primary osteoarthritis of knee: Secondary | ICD-10-CM

## 2014-07-06 DIAGNOSIS — I10 Essential (primary) hypertension: Secondary | ICD-10-CM

## 2014-07-06 DIAGNOSIS — E1169 Type 2 diabetes mellitus with other specified complication: Secondary | ICD-10-CM | POA: Diagnosis not present

## 2014-07-06 DIAGNOSIS — M1990 Unspecified osteoarthritis, unspecified site: Secondary | ICD-10-CM | POA: Diagnosis not present

## 2014-07-06 DIAGNOSIS — E119 Type 2 diabetes mellitus without complications: Secondary | ICD-10-CM | POA: Diagnosis not present

## 2014-07-06 DIAGNOSIS — G252 Other specified forms of tremor: Secondary | ICD-10-CM | POA: Diagnosis not present

## 2014-07-06 LAB — LIPID PANEL
CHOL/HDL RATIO: 4.2 ratio
Cholesterol: 125 mg/dL (ref 0–200)
HDL: 30 mg/dL — AB (ref >39)
LDL Cholesterol: 75 mg/dL (ref 0–99)
TRIGLYCERIDES: 99 mg/dL (ref <150)
VLDL: 20 mg/dL (ref 0–40)

## 2014-07-06 LAB — GLUCOSE, CAPILLARY: Glucose-Capillary: 213 mg/dL — ABNORMAL HIGH (ref 70–99)

## 2014-07-06 LAB — POCT GLYCOSYLATED HEMOGLOBIN (HGB A1C): Hemoglobin A1C: 7.8

## 2014-07-06 MED ORDER — PRAVASTATIN SODIUM 40 MG PO TABS
40.0000 mg | ORAL_TABLET | Freq: Every evening | ORAL | Status: DC
Start: 2014-07-06 — End: 2014-11-22

## 2014-07-06 MED ORDER — HYDROCODONE-ACETAMINOPHEN 7.5-325 MG PO TABS: 1.0000 | ORAL_TABLET | Freq: Four times a day (QID) | ORAL | Status: DC | PRN

## 2014-07-06 MED ORDER — LOVASTATIN 40 MG PO TABS
40.0000 mg | ORAL_TABLET | Freq: Every day | ORAL | Status: DC
Start: 2014-07-06 — End: 2014-07-06

## 2014-07-06 NOTE — Patient Instructions (Addendum)
Please continue to take your blood pressure medications as prescribed- your blood pressure was a bit too high today at 150/80. Healthy eating and exercise will help to control this number.  Also, please continue to monitor your blood sugars at home and avoid foods high in carbohydrates.  Finally, STOP taking your gemfibrozil and continue taking pravastatin 40 mg by mouth daily.  Your pain medication has been refilled.  Basic Carbohydrate Counting for Diabetes Mellitus Carbohydrate counting is a method for keeping track of the amount of carbohydrates you eat. Eating carbohydrates naturally increases the level of sugar (glucose) in your blood, so it is important for you to know the amount that is okay for you to have in every meal. Carbohydrate counting helps keep the level of glucose in your blood within normal limits. The amount of carbohydrates allowed is different for every person. A dietitian can help you calculate the amount that is right for you. Once you know the amount of carbohydrates you can have, you can count the carbohydrates in the foods you want to eat. Carbohydrates are found in the following foods:  Grains, such as breads and cereals.  Dried beans and soy products.  Starchy vegetables, such as potatoes, peas, and corn.  Fruit and fruit juices.  Milk and yogurt.  Sweets and snack foods, such as cake, cookies, candy, chips, soft drinks, and fruit drinks. CARBOHYDRATE COUNTING There are two ways to count the carbohydrates in your food. You can use either of the methods or a combination of both. Reading the "Nutrition Facts" on [place] The "Nutrition Facts" is an area that is included on the labels of almost all packaged food and beverages in the [place]. It includes the serving size of that food or beverage and information about the nutrients in each serving of the food, including the grams (g) of carbohydrate per serving.  Decide the number of servings of this  food or beverage that you will be able to eat or drink. Multiply that number of servings by the number of grams of carbohydrate that is listed on the label for that serving. The total will be the amount of carbohydrates you will be having when you eat or drink this food or beverage. Learning Standard Serving Sizes of Food When you eat food that is not packaged or does not include "Nutrition Facts" on the label, you need to measure the servings in order to count the amount of carbohydrates.A serving of most carbohydrate-rich foods contains about 15 g of carbohydrates. The following list includes serving sizes of carbohydrate-rich foods that provide 15 g ofcarbohydrate per serving:   1 slice of bread (1 oz) or 1 six-inch tortilla.    of a hamburger bun or English muffin.  4-6 crackers.   cup unsweetened dry cereal.    cup hot cereal.   cup rice or pasta.    cup mashed potatoes or  of a large baked potato.  1 cup fresh fruit or one small piece of fruit.    cup canned or frozen fruit or fruit juice.  1 cup milk.   cup plain fat-free yogurt or yogurt sweetened with artificial sweeteners.   cup cooked dried beans or starchy vegetable, such as peas, corn, or potatoes.  Decide the number of standard-size servings that you will eat. Multiply that number of servings by 15 (the grams of carbohydrates in that serving). For example, if you eat 2 cups of strawberries, you will have eaten 2 servings and 30 g  of carbohydrates (2 servings x 15 g = 30 g). For foods such as soups and casseroles, in which more than one food is mixed in, you will need to count the carbohydrates in each food that is included. EXAMPLE OF CARBOHYDRATE COUNTING Sample Dinner  3 oz chicken breast.   cup of brown rice.   cup of corn.  1 cup milk.   1 cup strawberries with sugar-free whipped topping.  Carbohydrate Calculation Step 1: Identify the foods that contain carbohydrates:   Rice.   Corn.    Milk.   Strawberries. Step 2:Calculate the number of servings eaten of each:   2 servings of rice.   1 serving of corn.   1 serving of milk.   1 serving of strawberries. Step 3: Multiply each of those number of servings by 15 g:   2 servings of rice x 15 g = 30 g.   1 serving of corn x 15 g = 15 g.   1 serving of milk x 15 g = 15 g.   1 serving of strawberries x 15 g = 15 g. Step 4: Add together all of the amounts to find the total grams of carbohydrates eaten: 30 g + 15 g + 15 g + 15 g = 75 g. Document Released: 06/09/2005 Document Revised: 10/24/2013 Document Reviewed: 05/06/2013 [facility] Patient Information 2015 [place], [place]. This information is not intended to replace advice given to you by your health care provider. Make sure you discuss any questions you have with your health care provider.   General Instructions:   Please try to bring all your medicines next time. This will help [place] keep you safe from mistakes.   Progress Toward Treatment Goals:  Treatment Goal 02/01/2014  Hemoglobin A1C deteriorated  Blood pressure deteriorated    Self Care Goals & Plans:  Self Care Goal 02/13/2014  Manage my medications take my medicines as prescribed; bring my medications to every visit; refill my medications on time  Monitor my health keep track of my blood glucose; bring my glucose meter and log to each visit; check my feet daily  Eat healthy foods eat more vegetables; eat foods that are low in salt; eat baked foods instead of fried foods  Be physically active find an activity I enjoy    Home Blood Glucose Monitoring 03/23/2013  Check my blood sugar once a day  When to check my blood sugar N/A; before meals     Care Management & Community Referrals:  Referral 10/19/2013  Referrals made for care management support none needed

## 2014-07-06 NOTE — Assessment & Plan Note (Signed)
BP Readings from Last 3 Encounters:  07/06/14 150/80  02/13/14 128/73  02/01/14 142/87    Lab Results  Component Value Date   NA 142 11/02/2013   K 4.4 11/02/2013   CREATININE 0.84 11/02/2013    Assessment: Blood pressure control:  mildly elevated  Progress toward BP goal:   not at goal  Plan: Medications:  continue current medications (lisinopril 20 mg daily, diltiazem 240 mg daily and propranolol 40 mg daily). Educational resources provided:  Patient determined to start exercising Other plans: If BP under better control in the future, may wean off of propranolol

## 2014-07-06 NOTE — Progress Notes (Signed)
   Subjective:    Patient ID: [name], male    DOB: 08/29/1950, 64 y.o.   MRN: [id number]  HPI Mr. Jordanny Waddington is a 64 yo man with a history of HTN, COPD, DM2, HLD and osteoarthritis who is presenting to clinic for a follow up and medication refill.  He states that his sugars have been in and around 100 at home; he checks about once per day. He did not bring his meter with him today, but asserts that his finger stick might be unrepresentative, given that he just ate a peppermint candy. He has had no symptoms of hypoglycemia.  He does not take his blood pressure at home, but denies shortness of breath, chest pain, headache or blurred vision. Gemfibrozil was added to his hyperlipidemia regimen (pravastatin) in 01/2014. He denies upset stomach and muscle pain but does have chronic joint pain.   His knee pain has continued to bother him, but his pain regimen helps quite a lot. He is currently wearing his braces on both knees.   He was seen in 01/2014 for a painful nodule on his neck which was treated with a one week course of doxycycline; the nodule completely resolved.   Review of Systems  Constitutional: Negative for fever, chills, activity change, appetite change, fatigue and unexpected weight change.  HENT: Negative.   Eyes: Negative.   Respiratory: Negative.  Negative for shortness of breath.   Cardiovascular: Negative for chest pain, palpitations and leg swelling.  Gastrointestinal: Negative.   Genitourinary: Negative.   Musculoskeletal:       Bilateral knee pain (chronic)  Skin: Negative for pallor, rash and wound.  Neurological: Negative for dizziness, light-headedness and headaches.  Psychiatric/Behavioral: Negative.        Objective:   Physical Exam  Constitutional: He is oriented to person, place, and time. He appears well-developed and well-nourished. No distress.  HENT:  Head: Normocephalic and atraumatic.  Eyes: Conjunctivae and EOM are normal. Pupils are  equal, round, and reactive to light.  Neck: Normal range of motion.  Cardiovascular: Normal rate, regular rhythm and normal heart sounds.   Pulmonary/Chest: Effort normal and breath sounds normal. He has no wheezes.  Abdominal: Soft. Bowel sounds are normal. There is no tenderness.  Musculoskeletal: He exhibits tenderness. He exhibits no edema.  Knee braces in place b/l  Neurological: He is alert and oriented to person, place, and time.  Essential tremor noted in hands b/l  Skin: Skin is warm and dry.  Psychiatric: He has a normal mood and affect.       Assessment & Plan:  Please see problem oriented charting for assessment & plan  Venita Lick, MD

## 2014-07-06 NOTE — Assessment & Plan Note (Signed)
Patient continues to have bilateral knee pain. He wears braces on both knees. Pain has not changed and patient says it is well-managed on hydrocodone-acetaminophen 7.5-325 q6 hours PRN.  - Refilled hydrocodone-acetaminophen today

## 2014-07-06 NOTE — Progress Notes (Signed)
Internal Medicine Clinic Attending  Case discussed with Dr. Mallory at the time of the visit.  We reviewed the resident's history and exam and pertinent patient test results.  I agree with the assessment, diagnosis, and plan of care documented in the resident's note. 

## 2014-07-06 NOTE — Assessment & Plan Note (Addendum)
Lab Results  Component Value Date   HGBA1C 7.8 07/06/2014   HGBA1C 8.0 02/01/2014   HGBA1C 7.7 10/19/2013     Assessment: Diabetes control:  fair Progress toward A1C goal:   moved closer to goal in last 6 months Comments: A1c trended slightly down. Patient was reminded to bring his glucometer (says he has several at home). He has a good understanding of what foods to avoid.  Plan: Medications:  continue current medications  Home glucose monitoring: Frequency:   Timing:   Instruction/counseling given: reminded to bring blood glucose meter & log to each visit Patient's previously scheduled appointment at [facility] 05/2104 has been moved to the end of 06/2014. Patient was asked to have records sent to [facility].

## 2014-07-06 NOTE — Assessment & Plan Note (Addendum)
Patient's last lipid panel was 01/2014 and he was found to have: Chol: 147 Trig: 228 HDL: 38 LDL: 63 With his other risk factors, this puts him at a 35% ASCVD 10-year risk. He had already been on pravastatin 40 mg daily at that time and was started on gemfibrozil. Given that the patient was not fasting during the last lipid panel, and that gemfibrozil can contribute to joint pain (and has other side effects), hesitant to continue the gemfibrozil now.  - Fasting lipid panel at next visit to assess triglycerides off of gemfibrozil - Stop gemfibrozil now - Continue pravastatin 40 mg daily - Repeat lipid profile today    Addendum: lipids 07/06/14 Chol: 125 Trig: 99 HDL: 30 LDL: 75 Patient's ASCVD risk difficult to calculate, as total cholesterol is lower than the calculator's boundaries now. Patient does seem to have had good response to addition of gemfibrozil; can consider adding it back at next visit if fasting lipids show increases or if patient's joint pain does not seem to have been affected by being taken off of the gemfibrozil.

## 2014-07-07 DIAGNOSIS — G252 Other specified forms of tremor: Secondary | ICD-10-CM | POA: Diagnosis not present

## 2014-07-07 DIAGNOSIS — M1990 Unspecified osteoarthritis, unspecified site: Secondary | ICD-10-CM | POA: Diagnosis not present

## 2014-07-07 DIAGNOSIS — G250 Essential tremor: Secondary | ICD-10-CM | POA: Diagnosis not present

## 2014-07-11 DIAGNOSIS — M1990 Unspecified osteoarthritis, unspecified site: Secondary | ICD-10-CM | POA: Diagnosis not present

## 2014-07-11 DIAGNOSIS — G252 Other specified forms of tremor: Secondary | ICD-10-CM | POA: Diagnosis not present

## 2014-07-11 DIAGNOSIS — G250 Essential tremor: Secondary | ICD-10-CM | POA: Diagnosis not present

## 2014-07-12 DIAGNOSIS — M1990 Unspecified osteoarthritis, unspecified site: Secondary | ICD-10-CM | POA: Diagnosis not present

## 2014-07-12 DIAGNOSIS — G252 Other specified forms of tremor: Secondary | ICD-10-CM | POA: Diagnosis not present

## 2014-07-12 DIAGNOSIS — G250 Essential tremor: Secondary | ICD-10-CM | POA: Diagnosis not present

## 2014-07-13 DIAGNOSIS — G250 Essential tremor: Secondary | ICD-10-CM | POA: Diagnosis not present

## 2014-07-13 DIAGNOSIS — G252 Other specified forms of tremor: Secondary | ICD-10-CM | POA: Diagnosis not present

## 2014-07-13 DIAGNOSIS — M1990 Unspecified osteoarthritis, unspecified site: Secondary | ICD-10-CM | POA: Diagnosis not present

## 2014-07-14 DIAGNOSIS — G252 Other specified forms of tremor: Secondary | ICD-10-CM | POA: Diagnosis not present

## 2014-07-14 DIAGNOSIS — G250 Essential tremor: Secondary | ICD-10-CM | POA: Diagnosis not present

## 2014-07-14 DIAGNOSIS — M1990 Unspecified osteoarthritis, unspecified site: Secondary | ICD-10-CM | POA: Diagnosis not present

## 2014-07-17 DIAGNOSIS — M1990 Unspecified osteoarthritis, unspecified site: Secondary | ICD-10-CM | POA: Diagnosis not present

## 2014-07-17 DIAGNOSIS — G252 Other specified forms of tremor: Secondary | ICD-10-CM | POA: Diagnosis not present

## 2014-07-17 DIAGNOSIS — G250 Essential tremor: Secondary | ICD-10-CM | POA: Diagnosis not present

## 2014-07-18 DIAGNOSIS — G252 Other specified forms of tremor: Secondary | ICD-10-CM | POA: Diagnosis not present

## 2014-07-18 DIAGNOSIS — G250 Essential tremor: Secondary | ICD-10-CM | POA: Diagnosis not present

## 2014-07-18 DIAGNOSIS — M1990 Unspecified osteoarthritis, unspecified site: Secondary | ICD-10-CM | POA: Diagnosis not present

## 2014-07-19 DIAGNOSIS — M1990 Unspecified osteoarthritis, unspecified site: Secondary | ICD-10-CM | POA: Diagnosis not present

## 2014-07-19 DIAGNOSIS — G250 Essential tremor: Secondary | ICD-10-CM | POA: Diagnosis not present

## 2014-07-19 DIAGNOSIS — G252 Other specified forms of tremor: Secondary | ICD-10-CM | POA: Diagnosis not present

## 2014-07-20 DIAGNOSIS — M1990 Unspecified osteoarthritis, unspecified site: Secondary | ICD-10-CM | POA: Diagnosis not present

## 2014-07-20 DIAGNOSIS — G250 Essential tremor: Secondary | ICD-10-CM | POA: Diagnosis not present

## 2014-07-20 DIAGNOSIS — G252 Other specified forms of tremor: Secondary | ICD-10-CM | POA: Diagnosis not present

## 2014-07-21 DIAGNOSIS — M1990 Unspecified osteoarthritis, unspecified site: Secondary | ICD-10-CM | POA: Diagnosis not present

## 2014-07-21 DIAGNOSIS — G250 Essential tremor: Secondary | ICD-10-CM | POA: Diagnosis not present

## 2014-07-21 DIAGNOSIS — G252 Other specified forms of tremor: Secondary | ICD-10-CM | POA: Diagnosis not present

## 2014-07-24 DIAGNOSIS — M1990 Unspecified osteoarthritis, unspecified site: Secondary | ICD-10-CM | POA: Diagnosis not present

## 2014-07-24 DIAGNOSIS — G252 Other specified forms of tremor: Secondary | ICD-10-CM | POA: Diagnosis not present

## 2014-07-24 DIAGNOSIS — G250 Essential tremor: Secondary | ICD-10-CM | POA: Diagnosis not present

## 2014-07-25 DIAGNOSIS — G252 Other specified forms of tremor: Secondary | ICD-10-CM | POA: Diagnosis not present

## 2014-07-25 DIAGNOSIS — G250 Essential tremor: Secondary | ICD-10-CM | POA: Diagnosis not present

## 2014-07-25 DIAGNOSIS — M1990 Unspecified osteoarthritis, unspecified site: Secondary | ICD-10-CM | POA: Diagnosis not present

## 2014-07-26 DIAGNOSIS — G250 Essential tremor: Secondary | ICD-10-CM | POA: Diagnosis not present

## 2014-07-26 DIAGNOSIS — G252 Other specified forms of tremor: Secondary | ICD-10-CM | POA: Diagnosis not present

## 2014-07-26 DIAGNOSIS — M1990 Unspecified osteoarthritis, unspecified site: Secondary | ICD-10-CM | POA: Diagnosis not present

## 2014-07-27 DIAGNOSIS — M1990 Unspecified osteoarthritis, unspecified site: Secondary | ICD-10-CM | POA: Diagnosis not present

## 2014-07-27 DIAGNOSIS — G252 Other specified forms of tremor: Secondary | ICD-10-CM | POA: Diagnosis not present

## 2014-07-27 DIAGNOSIS — G250 Essential tremor: Secondary | ICD-10-CM | POA: Diagnosis not present

## 2014-07-28 DIAGNOSIS — G250 Essential tremor: Secondary | ICD-10-CM | POA: Diagnosis not present

## 2014-07-28 DIAGNOSIS — M1990 Unspecified osteoarthritis, unspecified site: Secondary | ICD-10-CM | POA: Diagnosis not present

## 2014-07-28 DIAGNOSIS — G252 Other specified forms of tremor: Secondary | ICD-10-CM | POA: Diagnosis not present

## 2014-07-31 DIAGNOSIS — G250 Essential tremor: Secondary | ICD-10-CM | POA: Diagnosis not present

## 2014-07-31 DIAGNOSIS — M1990 Unspecified osteoarthritis, unspecified site: Secondary | ICD-10-CM | POA: Diagnosis not present

## 2014-07-31 DIAGNOSIS — G252 Other specified forms of tremor: Secondary | ICD-10-CM | POA: Diagnosis not present

## 2014-08-01 DIAGNOSIS — G252 Other specified forms of tremor: Secondary | ICD-10-CM | POA: Diagnosis not present

## 2014-08-01 DIAGNOSIS — G250 Essential tremor: Secondary | ICD-10-CM | POA: Diagnosis not present

## 2014-08-01 DIAGNOSIS — M1990 Unspecified osteoarthritis, unspecified site: Secondary | ICD-10-CM | POA: Diagnosis not present

## 2014-08-02 DIAGNOSIS — G250 Essential tremor: Secondary | ICD-10-CM | POA: Diagnosis not present

## 2014-08-02 DIAGNOSIS — M1990 Unspecified osteoarthritis, unspecified site: Secondary | ICD-10-CM | POA: Diagnosis not present

## 2014-08-02 DIAGNOSIS — G252 Other specified forms of tremor: Secondary | ICD-10-CM | POA: Diagnosis not present

## 2014-08-03 DIAGNOSIS — G250 Essential tremor: Secondary | ICD-10-CM | POA: Diagnosis not present

## 2014-08-03 DIAGNOSIS — G252 Other specified forms of tremor: Secondary | ICD-10-CM | POA: Diagnosis not present

## 2014-08-03 DIAGNOSIS — M1990 Unspecified osteoarthritis, unspecified site: Secondary | ICD-10-CM | POA: Diagnosis not present

## 2014-08-04 ENCOUNTER — Other Ambulatory Visit: Payer: Self-pay | Admitting: *Deleted

## 2014-08-04 DIAGNOSIS — M1990 Unspecified osteoarthritis, unspecified site: Secondary | ICD-10-CM | POA: Diagnosis not present

## 2014-08-04 DIAGNOSIS — G250 Essential tremor: Secondary | ICD-10-CM | POA: Diagnosis not present

## 2014-08-04 DIAGNOSIS — G252 Other specified forms of tremor: Secondary | ICD-10-CM | POA: Diagnosis not present

## 2014-08-04 DIAGNOSIS — M170 Bilateral primary osteoarthritis of knee: Secondary | ICD-10-CM

## 2014-08-07 DIAGNOSIS — G252 Other specified forms of tremor: Secondary | ICD-10-CM | POA: Diagnosis not present

## 2014-08-07 DIAGNOSIS — G250 Essential tremor: Secondary | ICD-10-CM | POA: Diagnosis not present

## 2014-08-07 DIAGNOSIS — M1990 Unspecified osteoarthritis, unspecified site: Secondary | ICD-10-CM | POA: Diagnosis not present

## 2014-08-08 DIAGNOSIS — G252 Other specified forms of tremor: Secondary | ICD-10-CM | POA: Diagnosis not present

## 2014-08-08 DIAGNOSIS — G250 Essential tremor: Secondary | ICD-10-CM | POA: Diagnosis not present

## 2014-08-08 DIAGNOSIS — M1990 Unspecified osteoarthritis, unspecified site: Secondary | ICD-10-CM | POA: Diagnosis not present

## 2014-08-08 MED ORDER — HYDROCODONE-ACETAMINOPHEN 7.5-325 MG PO TABS: 1.0000 | ORAL_TABLET | Freq: Four times a day (QID) | ORAL | Status: DC | PRN

## 2014-08-08 NOTE — Telephone Encounter (Signed)
Pt informed

## 2014-08-09 DIAGNOSIS — G252 Other specified forms of tremor: Secondary | ICD-10-CM | POA: Diagnosis not present

## 2014-08-09 DIAGNOSIS — M1990 Unspecified osteoarthritis, unspecified site: Secondary | ICD-10-CM | POA: Diagnosis not present

## 2014-08-09 DIAGNOSIS — G250 Essential tremor: Secondary | ICD-10-CM | POA: Diagnosis not present

## 2014-08-10 DIAGNOSIS — G252 Other specified forms of tremor: Secondary | ICD-10-CM | POA: Diagnosis not present

## 2014-08-10 DIAGNOSIS — M1990 Unspecified osteoarthritis, unspecified site: Secondary | ICD-10-CM | POA: Diagnosis not present

## 2014-08-10 DIAGNOSIS — G250 Essential tremor: Secondary | ICD-10-CM | POA: Diagnosis not present

## 2014-08-11 DIAGNOSIS — G250 Essential tremor: Secondary | ICD-10-CM | POA: Diagnosis not present

## 2014-08-11 DIAGNOSIS — M1990 Unspecified osteoarthritis, unspecified site: Secondary | ICD-10-CM | POA: Diagnosis not present

## 2014-08-11 DIAGNOSIS — G252 Other specified forms of tremor: Secondary | ICD-10-CM | POA: Diagnosis not present

## 2014-08-14 DIAGNOSIS — M1990 Unspecified osteoarthritis, unspecified site: Secondary | ICD-10-CM | POA: Diagnosis not present

## 2014-08-14 DIAGNOSIS — G250 Essential tremor: Secondary | ICD-10-CM | POA: Diagnosis not present

## 2014-08-14 DIAGNOSIS — G252 Other specified forms of tremor: Secondary | ICD-10-CM | POA: Diagnosis not present

## 2014-08-15 DIAGNOSIS — G250 Essential tremor: Secondary | ICD-10-CM | POA: Diagnosis not present

## 2014-08-15 DIAGNOSIS — G252 Other specified forms of tremor: Secondary | ICD-10-CM | POA: Diagnosis not present

## 2014-08-15 DIAGNOSIS — M1990 Unspecified osteoarthritis, unspecified site: Secondary | ICD-10-CM | POA: Diagnosis not present

## 2014-08-16 DIAGNOSIS — G252 Other specified forms of tremor: Secondary | ICD-10-CM | POA: Diagnosis not present

## 2014-08-16 DIAGNOSIS — G250 Essential tremor: Secondary | ICD-10-CM | POA: Diagnosis not present

## 2014-08-16 DIAGNOSIS — M1990 Unspecified osteoarthritis, unspecified site: Secondary | ICD-10-CM | POA: Diagnosis not present

## 2014-08-17 DIAGNOSIS — G252 Other specified forms of tremor: Secondary | ICD-10-CM | POA: Diagnosis not present

## 2014-08-17 DIAGNOSIS — G250 Essential tremor: Secondary | ICD-10-CM | POA: Diagnosis not present

## 2014-08-17 DIAGNOSIS — M1990 Unspecified osteoarthritis, unspecified site: Secondary | ICD-10-CM | POA: Diagnosis not present

## 2014-08-18 DIAGNOSIS — G252 Other specified forms of tremor: Secondary | ICD-10-CM | POA: Diagnosis not present

## 2014-08-18 DIAGNOSIS — M1990 Unspecified osteoarthritis, unspecified site: Secondary | ICD-10-CM | POA: Diagnosis not present

## 2014-08-18 DIAGNOSIS — G250 Essential tremor: Secondary | ICD-10-CM | POA: Diagnosis not present

## 2014-08-21 DIAGNOSIS — G252 Other specified forms of tremor: Secondary | ICD-10-CM | POA: Diagnosis not present

## 2014-08-21 DIAGNOSIS — M1990 Unspecified osteoarthritis, unspecified site: Secondary | ICD-10-CM | POA: Diagnosis not present

## 2014-08-21 DIAGNOSIS — G250 Essential tremor: Secondary | ICD-10-CM | POA: Diagnosis not present

## 2014-08-22 DIAGNOSIS — G250 Essential tremor: Secondary | ICD-10-CM | POA: Diagnosis not present

## 2014-08-22 DIAGNOSIS — M1990 Unspecified osteoarthritis, unspecified site: Secondary | ICD-10-CM | POA: Diagnosis not present

## 2014-08-22 DIAGNOSIS — G252 Other specified forms of tremor: Secondary | ICD-10-CM | POA: Diagnosis not present

## 2014-08-23 DIAGNOSIS — G252 Other specified forms of tremor: Secondary | ICD-10-CM | POA: Diagnosis not present

## 2014-08-23 DIAGNOSIS — M1990 Unspecified osteoarthritis, unspecified site: Secondary | ICD-10-CM | POA: Diagnosis not present

## 2014-08-23 DIAGNOSIS — G250 Essential tremor: Secondary | ICD-10-CM | POA: Diagnosis not present

## 2014-08-24 DIAGNOSIS — G250 Essential tremor: Secondary | ICD-10-CM | POA: Diagnosis not present

## 2014-08-24 DIAGNOSIS — M1990 Unspecified osteoarthritis, unspecified site: Secondary | ICD-10-CM | POA: Diagnosis not present

## 2014-08-24 DIAGNOSIS — G252 Other specified forms of tremor: Secondary | ICD-10-CM | POA: Diagnosis not present

## 2014-08-25 DIAGNOSIS — G252 Other specified forms of tremor: Secondary | ICD-10-CM | POA: Diagnosis not present

## 2014-08-25 DIAGNOSIS — G250 Essential tremor: Secondary | ICD-10-CM | POA: Diagnosis not present

## 2014-08-25 DIAGNOSIS — M1990 Unspecified osteoarthritis, unspecified site: Secondary | ICD-10-CM | POA: Diagnosis not present

## 2014-08-28 DIAGNOSIS — G250 Essential tremor: Secondary | ICD-10-CM | POA: Diagnosis not present

## 2014-08-28 DIAGNOSIS — M1990 Unspecified osteoarthritis, unspecified site: Secondary | ICD-10-CM | POA: Diagnosis not present

## 2014-08-28 DIAGNOSIS — G252 Other specified forms of tremor: Secondary | ICD-10-CM | POA: Diagnosis not present

## 2014-08-29 DIAGNOSIS — G252 Other specified forms of tremor: Secondary | ICD-10-CM | POA: Diagnosis not present

## 2014-08-29 DIAGNOSIS — G250 Essential tremor: Secondary | ICD-10-CM | POA: Diagnosis not present

## 2014-08-29 DIAGNOSIS — M1990 Unspecified osteoarthritis, unspecified site: Secondary | ICD-10-CM | POA: Diagnosis not present

## 2014-08-30 DIAGNOSIS — G250 Essential tremor: Secondary | ICD-10-CM | POA: Diagnosis not present

## 2014-08-30 DIAGNOSIS — M1990 Unspecified osteoarthritis, unspecified site: Secondary | ICD-10-CM | POA: Diagnosis not present

## 2014-08-30 DIAGNOSIS — G252 Other specified forms of tremor: Secondary | ICD-10-CM | POA: Diagnosis not present

## 2014-08-31 DIAGNOSIS — M1990 Unspecified osteoarthritis, unspecified site: Secondary | ICD-10-CM | POA: Diagnosis not present

## 2014-08-31 DIAGNOSIS — G252 Other specified forms of tremor: Secondary | ICD-10-CM | POA: Diagnosis not present

## 2014-08-31 DIAGNOSIS — G250 Essential tremor: Secondary | ICD-10-CM | POA: Diagnosis not present

## 2014-09-01 DIAGNOSIS — M1990 Unspecified osteoarthritis, unspecified site: Secondary | ICD-10-CM | POA: Diagnosis not present

## 2014-09-01 DIAGNOSIS — G252 Other specified forms of tremor: Secondary | ICD-10-CM | POA: Diagnosis not present

## 2014-09-01 DIAGNOSIS — G250 Essential tremor: Secondary | ICD-10-CM | POA: Diagnosis not present

## 2014-09-04 ENCOUNTER — Other Ambulatory Visit: Payer: Self-pay | Admitting: *Deleted

## 2014-09-04 DIAGNOSIS — G252 Other specified forms of tremor: Secondary | ICD-10-CM | POA: Diagnosis not present

## 2014-09-04 DIAGNOSIS — G250 Essential tremor: Secondary | ICD-10-CM | POA: Diagnosis not present

## 2014-09-04 DIAGNOSIS — M170 Bilateral primary osteoarthritis of knee: Secondary | ICD-10-CM

## 2014-09-04 DIAGNOSIS — M1990 Unspecified osteoarthritis, unspecified site: Secondary | ICD-10-CM | POA: Diagnosis not present

## 2014-09-05 DIAGNOSIS — M1990 Unspecified osteoarthritis, unspecified site: Secondary | ICD-10-CM | POA: Diagnosis not present

## 2014-09-05 DIAGNOSIS — G252 Other specified forms of tremor: Secondary | ICD-10-CM | POA: Diagnosis not present

## 2014-09-05 DIAGNOSIS — G250 Essential tremor: Secondary | ICD-10-CM | POA: Diagnosis not present

## 2014-09-05 MED ORDER — HYDROCODONE-ACETAMINOPHEN 7.5-325 MG PO TABS: 1.0000 | ORAL_TABLET | Freq: Four times a day (QID) | ORAL | Status: DC | PRN

## 2014-09-06 DIAGNOSIS — G250 Essential tremor: Secondary | ICD-10-CM | POA: Diagnosis not present

## 2014-09-06 DIAGNOSIS — G252 Other specified forms of tremor: Secondary | ICD-10-CM | POA: Diagnosis not present

## 2014-09-06 DIAGNOSIS — M1990 Unspecified osteoarthritis, unspecified site: Secondary | ICD-10-CM | POA: Diagnosis not present

## 2014-09-07 DIAGNOSIS — G250 Essential tremor: Secondary | ICD-10-CM | POA: Diagnosis not present

## 2014-09-07 DIAGNOSIS — G252 Other specified forms of tremor: Secondary | ICD-10-CM | POA: Diagnosis not present

## 2014-09-07 DIAGNOSIS — M1990 Unspecified osteoarthritis, unspecified site: Secondary | ICD-10-CM | POA: Diagnosis not present

## 2014-09-08 DIAGNOSIS — G252 Other specified forms of tremor: Secondary | ICD-10-CM | POA: Diagnosis not present

## 2014-09-08 DIAGNOSIS — M1990 Unspecified osteoarthritis, unspecified site: Secondary | ICD-10-CM | POA: Diagnosis not present

## 2014-09-08 DIAGNOSIS — G250 Essential tremor: Secondary | ICD-10-CM | POA: Diagnosis not present

## 2014-09-11 DIAGNOSIS — G250 Essential tremor: Secondary | ICD-10-CM | POA: Diagnosis not present

## 2014-09-11 DIAGNOSIS — M1990 Unspecified osteoarthritis, unspecified site: Secondary | ICD-10-CM | POA: Diagnosis not present

## 2014-09-11 DIAGNOSIS — G252 Other specified forms of tremor: Secondary | ICD-10-CM | POA: Diagnosis not present

## 2014-09-12 DIAGNOSIS — G250 Essential tremor: Secondary | ICD-10-CM | POA: Diagnosis not present

## 2014-09-12 DIAGNOSIS — G252 Other specified forms of tremor: Secondary | ICD-10-CM | POA: Diagnosis not present

## 2014-09-12 DIAGNOSIS — M1990 Unspecified osteoarthritis, unspecified site: Secondary | ICD-10-CM | POA: Diagnosis not present

## 2014-09-13 DIAGNOSIS — G250 Essential tremor: Secondary | ICD-10-CM | POA: Diagnosis not present

## 2014-09-13 DIAGNOSIS — G252 Other specified forms of tremor: Secondary | ICD-10-CM | POA: Diagnosis not present

## 2014-09-13 DIAGNOSIS — M1990 Unspecified osteoarthritis, unspecified site: Secondary | ICD-10-CM | POA: Diagnosis not present

## 2014-09-14 DIAGNOSIS — G250 Essential tremor: Secondary | ICD-10-CM | POA: Diagnosis not present

## 2014-09-14 DIAGNOSIS — G252 Other specified forms of tremor: Secondary | ICD-10-CM | POA: Diagnosis not present

## 2014-09-14 DIAGNOSIS — M1990 Unspecified osteoarthritis, unspecified site: Secondary | ICD-10-CM | POA: Diagnosis not present

## 2014-09-18 DIAGNOSIS — G250 Essential tremor: Secondary | ICD-10-CM | POA: Diagnosis not present

## 2014-09-18 DIAGNOSIS — G252 Other specified forms of tremor: Secondary | ICD-10-CM | POA: Diagnosis not present

## 2014-09-18 DIAGNOSIS — M1990 Unspecified osteoarthritis, unspecified site: Secondary | ICD-10-CM | POA: Diagnosis not present

## 2014-09-19 DIAGNOSIS — G250 Essential tremor: Secondary | ICD-10-CM | POA: Diagnosis not present

## 2014-09-19 DIAGNOSIS — G252 Other specified forms of tremor: Secondary | ICD-10-CM | POA: Diagnosis not present

## 2014-09-19 DIAGNOSIS — M1990 Unspecified osteoarthritis, unspecified site: Secondary | ICD-10-CM | POA: Diagnosis not present

## 2014-09-20 DIAGNOSIS — G252 Other specified forms of tremor: Secondary | ICD-10-CM | POA: Diagnosis not present

## 2014-09-20 DIAGNOSIS — M1990 Unspecified osteoarthritis, unspecified site: Secondary | ICD-10-CM | POA: Diagnosis not present

## 2014-09-20 DIAGNOSIS — G250 Essential tremor: Secondary | ICD-10-CM | POA: Diagnosis not present

## 2014-09-21 DIAGNOSIS — G250 Essential tremor: Secondary | ICD-10-CM | POA: Diagnosis not present

## 2014-09-21 DIAGNOSIS — G252 Other specified forms of tremor: Secondary | ICD-10-CM | POA: Diagnosis not present

## 2014-09-21 DIAGNOSIS — M1990 Unspecified osteoarthritis, unspecified site: Secondary | ICD-10-CM | POA: Diagnosis not present

## 2014-09-22 DIAGNOSIS — G250 Essential tremor: Secondary | ICD-10-CM | POA: Diagnosis not present

## 2014-09-22 DIAGNOSIS — M1990 Unspecified osteoarthritis, unspecified site: Secondary | ICD-10-CM | POA: Diagnosis not present

## 2014-09-22 DIAGNOSIS — G252 Other specified forms of tremor: Secondary | ICD-10-CM | POA: Diagnosis not present

## 2014-09-25 DIAGNOSIS — G250 Essential tremor: Secondary | ICD-10-CM | POA: Diagnosis not present

## 2014-09-25 DIAGNOSIS — M1990 Unspecified osteoarthritis, unspecified site: Secondary | ICD-10-CM | POA: Diagnosis not present

## 2014-09-25 DIAGNOSIS — G252 Other specified forms of tremor: Secondary | ICD-10-CM | POA: Diagnosis not present

## 2014-09-26 DIAGNOSIS — G250 Essential tremor: Secondary | ICD-10-CM | POA: Diagnosis not present

## 2014-09-26 DIAGNOSIS — G252 Other specified forms of tremor: Secondary | ICD-10-CM | POA: Diagnosis not present

## 2014-09-26 DIAGNOSIS — M1990 Unspecified osteoarthritis, unspecified site: Secondary | ICD-10-CM | POA: Diagnosis not present

## 2014-09-27 ENCOUNTER — Other Ambulatory Visit: Payer: Self-pay | Admitting: *Deleted

## 2014-09-27 DIAGNOSIS — G250 Essential tremor: Secondary | ICD-10-CM | POA: Diagnosis not present

## 2014-09-27 DIAGNOSIS — G252 Other specified forms of tremor: Secondary | ICD-10-CM | POA: Diagnosis not present

## 2014-09-27 DIAGNOSIS — M1990 Unspecified osteoarthritis, unspecified site: Secondary | ICD-10-CM | POA: Diagnosis not present

## 2014-09-27 MED ORDER — DILTIAZEM HCL ER BEADS 240 MG PO CP24: 240.0000 mg | ORAL_CAPSULE | Freq: Every day | ORAL | Status: DC

## 2014-09-28 DIAGNOSIS — G250 Essential tremor: Secondary | ICD-10-CM | POA: Diagnosis not present

## 2014-09-28 DIAGNOSIS — M1990 Unspecified osteoarthritis, unspecified site: Secondary | ICD-10-CM | POA: Diagnosis not present

## 2014-09-28 DIAGNOSIS — G252 Other specified forms of tremor: Secondary | ICD-10-CM | POA: Diagnosis not present

## 2014-09-29 DIAGNOSIS — M1990 Unspecified osteoarthritis, unspecified site: Secondary | ICD-10-CM | POA: Diagnosis not present

## 2014-09-29 DIAGNOSIS — G250 Essential tremor: Secondary | ICD-10-CM | POA: Diagnosis not present

## 2014-09-29 DIAGNOSIS — G252 Other specified forms of tremor: Secondary | ICD-10-CM | POA: Diagnosis not present

## 2014-10-02 DIAGNOSIS — M1990 Unspecified osteoarthritis, unspecified site: Secondary | ICD-10-CM | POA: Diagnosis not present

## 2014-10-02 DIAGNOSIS — G250 Essential tremor: Secondary | ICD-10-CM | POA: Diagnosis not present

## 2014-10-02 DIAGNOSIS — G252 Other specified forms of tremor: Secondary | ICD-10-CM | POA: Diagnosis not present

## 2014-10-03 ENCOUNTER — Other Ambulatory Visit: Payer: Self-pay | Admitting: *Deleted

## 2014-10-03 DIAGNOSIS — M170 Bilateral primary osteoarthritis of knee: Secondary | ICD-10-CM

## 2014-10-03 DIAGNOSIS — M1990 Unspecified osteoarthritis, unspecified site: Secondary | ICD-10-CM | POA: Diagnosis not present

## 2014-10-03 DIAGNOSIS — G252 Other specified forms of tremor: Secondary | ICD-10-CM | POA: Diagnosis not present

## 2014-10-03 DIAGNOSIS — G250 Essential tremor: Secondary | ICD-10-CM | POA: Diagnosis not present

## 2014-10-03 MED ORDER — HYDROCODONE-ACETAMINOPHEN 7.5-325 MG PO TABS: 1.0000 | ORAL_TABLET | Freq: Four times a day (QID) | ORAL | Status: DC | PRN

## 2014-10-03 NOTE — Telephone Encounter (Signed)
Pt.notified

## 2014-10-04 DIAGNOSIS — M1990 Unspecified osteoarthritis, unspecified site: Secondary | ICD-10-CM | POA: Diagnosis not present

## 2014-10-04 DIAGNOSIS — G250 Essential tremor: Secondary | ICD-10-CM | POA: Diagnosis not present

## 2014-10-04 DIAGNOSIS — G252 Other specified forms of tremor: Secondary | ICD-10-CM | POA: Diagnosis not present

## 2014-10-05 DIAGNOSIS — G252 Other specified forms of tremor: Secondary | ICD-10-CM | POA: Diagnosis not present

## 2014-10-05 DIAGNOSIS — G250 Essential tremor: Secondary | ICD-10-CM | POA: Diagnosis not present

## 2014-10-05 DIAGNOSIS — M1990 Unspecified osteoarthritis, unspecified site: Secondary | ICD-10-CM | POA: Diagnosis not present

## 2014-10-06 DIAGNOSIS — M1990 Unspecified osteoarthritis, unspecified site: Secondary | ICD-10-CM | POA: Diagnosis not present

## 2014-10-06 DIAGNOSIS — G250 Essential tremor: Secondary | ICD-10-CM | POA: Diagnosis not present

## 2014-10-06 DIAGNOSIS — G252 Other specified forms of tremor: Secondary | ICD-10-CM | POA: Diagnosis not present

## 2014-10-09 DIAGNOSIS — G250 Essential tremor: Secondary | ICD-10-CM | POA: Diagnosis not present

## 2014-10-09 DIAGNOSIS — M1990 Unspecified osteoarthritis, unspecified site: Secondary | ICD-10-CM | POA: Diagnosis not present

## 2014-10-09 DIAGNOSIS — G252 Other specified forms of tremor: Secondary | ICD-10-CM | POA: Diagnosis not present

## 2014-10-10 DIAGNOSIS — M1990 Unspecified osteoarthritis, unspecified site: Secondary | ICD-10-CM | POA: Diagnosis not present

## 2014-10-10 DIAGNOSIS — G250 Essential tremor: Secondary | ICD-10-CM | POA: Diagnosis not present

## 2014-10-10 DIAGNOSIS — G252 Other specified forms of tremor: Secondary | ICD-10-CM | POA: Diagnosis not present

## 2014-10-11 DIAGNOSIS — M1990 Unspecified osteoarthritis, unspecified site: Secondary | ICD-10-CM | POA: Diagnosis not present

## 2014-10-11 DIAGNOSIS — G250 Essential tremor: Secondary | ICD-10-CM | POA: Diagnosis not present

## 2014-10-11 DIAGNOSIS — G252 Other specified forms of tremor: Secondary | ICD-10-CM | POA: Diagnosis not present

## 2014-10-12 DIAGNOSIS — M1990 Unspecified osteoarthritis, unspecified site: Secondary | ICD-10-CM | POA: Diagnosis not present

## 2014-10-12 DIAGNOSIS — G252 Other specified forms of tremor: Secondary | ICD-10-CM | POA: Diagnosis not present

## 2014-10-12 DIAGNOSIS — G250 Essential tremor: Secondary | ICD-10-CM | POA: Diagnosis not present

## 2014-10-13 DIAGNOSIS — G252 Other specified forms of tremor: Secondary | ICD-10-CM | POA: Diagnosis not present

## 2014-10-13 DIAGNOSIS — G250 Essential tremor: Secondary | ICD-10-CM | POA: Diagnosis not present

## 2014-10-13 DIAGNOSIS — M1990 Unspecified osteoarthritis, unspecified site: Secondary | ICD-10-CM | POA: Diagnosis not present

## 2014-10-16 DIAGNOSIS — G252 Other specified forms of tremor: Secondary | ICD-10-CM | POA: Diagnosis not present

## 2014-10-16 DIAGNOSIS — M1990 Unspecified osteoarthritis, unspecified site: Secondary | ICD-10-CM | POA: Diagnosis not present

## 2014-10-16 DIAGNOSIS — G250 Essential tremor: Secondary | ICD-10-CM | POA: Diagnosis not present

## 2014-10-17 DIAGNOSIS — M1990 Unspecified osteoarthritis, unspecified site: Secondary | ICD-10-CM | POA: Diagnosis not present

## 2014-10-17 DIAGNOSIS — G250 Essential tremor: Secondary | ICD-10-CM | POA: Diagnosis not present

## 2014-10-17 DIAGNOSIS — G252 Other specified forms of tremor: Secondary | ICD-10-CM | POA: Diagnosis not present

## 2014-10-18 DIAGNOSIS — G252 Other specified forms of tremor: Secondary | ICD-10-CM | POA: Diagnosis not present

## 2014-10-18 DIAGNOSIS — M1990 Unspecified osteoarthritis, unspecified site: Secondary | ICD-10-CM | POA: Diagnosis not present

## 2014-10-18 DIAGNOSIS — G250 Essential tremor: Secondary | ICD-10-CM | POA: Diagnosis not present

## 2014-10-19 DIAGNOSIS — G252 Other specified forms of tremor: Secondary | ICD-10-CM | POA: Diagnosis not present

## 2014-10-19 DIAGNOSIS — M1990 Unspecified osteoarthritis, unspecified site: Secondary | ICD-10-CM | POA: Diagnosis not present

## 2014-10-19 DIAGNOSIS — G250 Essential tremor: Secondary | ICD-10-CM | POA: Diagnosis not present

## 2014-10-20 DIAGNOSIS — G252 Other specified forms of tremor: Secondary | ICD-10-CM | POA: Diagnosis not present

## 2014-10-20 DIAGNOSIS — M1990 Unspecified osteoarthritis, unspecified site: Secondary | ICD-10-CM | POA: Diagnosis not present

## 2014-10-20 DIAGNOSIS — G250 Essential tremor: Secondary | ICD-10-CM | POA: Diagnosis not present

## 2014-10-23 DIAGNOSIS — G250 Essential tremor: Secondary | ICD-10-CM | POA: Diagnosis not present

## 2014-10-23 DIAGNOSIS — M1990 Unspecified osteoarthritis, unspecified site: Secondary | ICD-10-CM | POA: Diagnosis not present

## 2014-10-23 DIAGNOSIS — G252 Other specified forms of tremor: Secondary | ICD-10-CM | POA: Diagnosis not present

## 2014-10-24 DIAGNOSIS — M1990 Unspecified osteoarthritis, unspecified site: Secondary | ICD-10-CM | POA: Diagnosis not present

## 2014-10-24 DIAGNOSIS — G250 Essential tremor: Secondary | ICD-10-CM | POA: Diagnosis not present

## 2014-10-24 DIAGNOSIS — G252 Other specified forms of tremor: Secondary | ICD-10-CM | POA: Diagnosis not present

## 2014-10-25 DIAGNOSIS — M1990 Unspecified osteoarthritis, unspecified site: Secondary | ICD-10-CM | POA: Diagnosis not present

## 2014-10-25 DIAGNOSIS — G252 Other specified forms of tremor: Secondary | ICD-10-CM | POA: Diagnosis not present

## 2014-10-25 DIAGNOSIS — G250 Essential tremor: Secondary | ICD-10-CM | POA: Diagnosis not present

## 2014-10-26 DIAGNOSIS — M1990 Unspecified osteoarthritis, unspecified site: Secondary | ICD-10-CM | POA: Diagnosis not present

## 2014-10-26 DIAGNOSIS — G252 Other specified forms of tremor: Secondary | ICD-10-CM | POA: Diagnosis not present

## 2014-10-26 DIAGNOSIS — G250 Essential tremor: Secondary | ICD-10-CM | POA: Diagnosis not present

## 2014-10-27 DIAGNOSIS — G252 Other specified forms of tremor: Secondary | ICD-10-CM | POA: Diagnosis not present

## 2014-10-27 DIAGNOSIS — M1990 Unspecified osteoarthritis, unspecified site: Secondary | ICD-10-CM | POA: Diagnosis not present

## 2014-10-27 DIAGNOSIS — G250 Essential tremor: Secondary | ICD-10-CM | POA: Diagnosis not present

## 2014-10-30 DIAGNOSIS — G250 Essential tremor: Secondary | ICD-10-CM | POA: Diagnosis not present

## 2014-10-30 DIAGNOSIS — M1990 Unspecified osteoarthritis, unspecified site: Secondary | ICD-10-CM | POA: Diagnosis not present

## 2014-10-30 DIAGNOSIS — G252 Other specified forms of tremor: Secondary | ICD-10-CM | POA: Diagnosis not present

## 2014-10-31 ENCOUNTER — Other Ambulatory Visit: Payer: Self-pay | Admitting: *Deleted

## 2014-10-31 DIAGNOSIS — M1990 Unspecified osteoarthritis, unspecified site: Secondary | ICD-10-CM | POA: Diagnosis not present

## 2014-10-31 DIAGNOSIS — G252 Other specified forms of tremor: Secondary | ICD-10-CM | POA: Diagnosis not present

## 2014-10-31 DIAGNOSIS — G250 Essential tremor: Secondary | ICD-10-CM | POA: Diagnosis not present

## 2014-10-31 DIAGNOSIS — M170 Bilateral primary osteoarthritis of knee: Secondary | ICD-10-CM

## 2014-10-31 MED ORDER — HYDROCODONE-ACETAMINOPHEN 7.5-325 MG PO TABS: 1.0000 | ORAL_TABLET | Freq: Four times a day (QID) | ORAL | Status: DC | PRN

## 2014-10-31 NOTE — Telephone Encounter (Signed)
notified

## 2014-11-01 DIAGNOSIS — M1990 Unspecified osteoarthritis, unspecified site: Secondary | ICD-10-CM | POA: Diagnosis not present

## 2014-11-01 DIAGNOSIS — G252 Other specified forms of tremor: Secondary | ICD-10-CM | POA: Diagnosis not present

## 2014-11-01 DIAGNOSIS — G250 Essential tremor: Secondary | ICD-10-CM | POA: Diagnosis not present

## 2014-11-02 DIAGNOSIS — G250 Essential tremor: Secondary | ICD-10-CM | POA: Diagnosis not present

## 2014-11-02 DIAGNOSIS — G252 Other specified forms of tremor: Secondary | ICD-10-CM | POA: Diagnosis not present

## 2014-11-02 DIAGNOSIS — M1990 Unspecified osteoarthritis, unspecified site: Secondary | ICD-10-CM | POA: Diagnosis not present

## 2014-11-03 DIAGNOSIS — G250 Essential tremor: Secondary | ICD-10-CM | POA: Diagnosis not present

## 2014-11-03 DIAGNOSIS — M1990 Unspecified osteoarthritis, unspecified site: Secondary | ICD-10-CM | POA: Diagnosis not present

## 2014-11-03 DIAGNOSIS — G252 Other specified forms of tremor: Secondary | ICD-10-CM | POA: Diagnosis not present

## 2014-11-06 DIAGNOSIS — G252 Other specified forms of tremor: Secondary | ICD-10-CM | POA: Diagnosis not present

## 2014-11-06 DIAGNOSIS — M1990 Unspecified osteoarthritis, unspecified site: Secondary | ICD-10-CM | POA: Diagnosis not present

## 2014-11-06 DIAGNOSIS — G250 Essential tremor: Secondary | ICD-10-CM | POA: Diagnosis not present

## 2014-11-07 DIAGNOSIS — M1990 Unspecified osteoarthritis, unspecified site: Secondary | ICD-10-CM | POA: Diagnosis not present

## 2014-11-07 DIAGNOSIS — G252 Other specified forms of tremor: Secondary | ICD-10-CM | POA: Diagnosis not present

## 2014-11-07 DIAGNOSIS — G250 Essential tremor: Secondary | ICD-10-CM | POA: Diagnosis not present

## 2014-11-08 DIAGNOSIS — M1990 Unspecified osteoarthritis, unspecified site: Secondary | ICD-10-CM | POA: Diagnosis not present

## 2014-11-08 DIAGNOSIS — G250 Essential tremor: Secondary | ICD-10-CM | POA: Diagnosis not present

## 2014-11-08 DIAGNOSIS — G252 Other specified forms of tremor: Secondary | ICD-10-CM | POA: Diagnosis not present

## 2014-11-09 DIAGNOSIS — G250 Essential tremor: Secondary | ICD-10-CM | POA: Diagnosis not present

## 2014-11-09 DIAGNOSIS — M1990 Unspecified osteoarthritis, unspecified site: Secondary | ICD-10-CM | POA: Diagnosis not present

## 2014-11-09 DIAGNOSIS — G252 Other specified forms of tremor: Secondary | ICD-10-CM | POA: Diagnosis not present

## 2014-11-10 DIAGNOSIS — M1990 Unspecified osteoarthritis, unspecified site: Secondary | ICD-10-CM | POA: Diagnosis not present

## 2014-11-10 DIAGNOSIS — G252 Other specified forms of tremor: Secondary | ICD-10-CM | POA: Diagnosis not present

## 2014-11-10 DIAGNOSIS — G250 Essential tremor: Secondary | ICD-10-CM | POA: Diagnosis not present

## 2014-11-13 DIAGNOSIS — G250 Essential tremor: Secondary | ICD-10-CM | POA: Diagnosis not present

## 2014-11-13 DIAGNOSIS — M1990 Unspecified osteoarthritis, unspecified site: Secondary | ICD-10-CM | POA: Diagnosis not present

## 2014-11-13 DIAGNOSIS — G252 Other specified forms of tremor: Secondary | ICD-10-CM | POA: Diagnosis not present

## 2014-11-14 DIAGNOSIS — G250 Essential tremor: Secondary | ICD-10-CM | POA: Diagnosis not present

## 2014-11-14 DIAGNOSIS — M1990 Unspecified osteoarthritis, unspecified site: Secondary | ICD-10-CM | POA: Diagnosis not present

## 2014-11-14 DIAGNOSIS — G252 Other specified forms of tremor: Secondary | ICD-10-CM | POA: Diagnosis not present

## 2014-11-15 DIAGNOSIS — M1990 Unspecified osteoarthritis, unspecified site: Secondary | ICD-10-CM | POA: Diagnosis not present

## 2014-11-15 DIAGNOSIS — G252 Other specified forms of tremor: Secondary | ICD-10-CM | POA: Diagnosis not present

## 2014-11-15 DIAGNOSIS — G250 Essential tremor: Secondary | ICD-10-CM | POA: Diagnosis not present

## 2014-11-16 DIAGNOSIS — G252 Other specified forms of tremor: Secondary | ICD-10-CM | POA: Diagnosis not present

## 2014-11-16 DIAGNOSIS — G250 Essential tremor: Secondary | ICD-10-CM | POA: Diagnosis not present

## 2014-11-16 DIAGNOSIS — M1990 Unspecified osteoarthritis, unspecified site: Secondary | ICD-10-CM | POA: Diagnosis not present

## 2014-11-17 DIAGNOSIS — G250 Essential tremor: Secondary | ICD-10-CM | POA: Diagnosis not present

## 2014-11-17 DIAGNOSIS — G252 Other specified forms of tremor: Secondary | ICD-10-CM | POA: Diagnosis not present

## 2014-11-17 DIAGNOSIS — M1990 Unspecified osteoarthritis, unspecified site: Secondary | ICD-10-CM | POA: Diagnosis not present

## 2014-11-20 DIAGNOSIS — G250 Essential tremor: Secondary | ICD-10-CM | POA: Diagnosis not present

## 2014-11-20 DIAGNOSIS — G252 Other specified forms of tremor: Secondary | ICD-10-CM | POA: Diagnosis not present

## 2014-11-20 DIAGNOSIS — M1990 Unspecified osteoarthritis, unspecified site: Secondary | ICD-10-CM | POA: Diagnosis not present

## 2014-11-21 DIAGNOSIS — M1990 Unspecified osteoarthritis, unspecified site: Secondary | ICD-10-CM | POA: Diagnosis not present

## 2014-11-21 DIAGNOSIS — G252 Other specified forms of tremor: Secondary | ICD-10-CM | POA: Diagnosis not present

## 2014-11-21 DIAGNOSIS — G250 Essential tremor: Secondary | ICD-10-CM | POA: Diagnosis not present

## 2014-11-22 ENCOUNTER — Encounter: Payer: Self-pay | Admitting: Internal Medicine

## 2014-11-22 ENCOUNTER — Other Ambulatory Visit: Payer: Self-pay | Admitting: *Deleted

## 2014-11-22 ENCOUNTER — Encounter: Payer: Commercial Managed Care - HMO | Admitting: Internal Medicine

## 2014-11-22 DIAGNOSIS — E785 Hyperlipidemia, unspecified: Principal | ICD-10-CM

## 2014-11-22 DIAGNOSIS — M1990 Unspecified osteoarthritis, unspecified site: Secondary | ICD-10-CM | POA: Diagnosis not present

## 2014-11-22 DIAGNOSIS — G250 Essential tremor: Secondary | ICD-10-CM | POA: Diagnosis not present

## 2014-11-22 DIAGNOSIS — E1169 Type 2 diabetes mellitus with other specified complication: Secondary | ICD-10-CM

## 2014-11-22 DIAGNOSIS — G252 Other specified forms of tremor: Secondary | ICD-10-CM | POA: Diagnosis not present

## 2014-11-22 MED ORDER — PRAVASTATIN SODIUM 40 MG PO TABS: 40.0000 mg | ORAL_TABLET | Freq: Every evening | ORAL | Status: DC

## 2014-11-23 DIAGNOSIS — G250 Essential tremor: Secondary | ICD-10-CM | POA: Diagnosis not present

## 2014-11-23 DIAGNOSIS — M1990 Unspecified osteoarthritis, unspecified site: Secondary | ICD-10-CM | POA: Diagnosis not present

## 2014-11-23 DIAGNOSIS — G252 Other specified forms of tremor: Secondary | ICD-10-CM | POA: Diagnosis not present

## 2014-11-24 DIAGNOSIS — G252 Other specified forms of tremor: Secondary | ICD-10-CM | POA: Diagnosis not present

## 2014-11-24 DIAGNOSIS — M1990 Unspecified osteoarthritis, unspecified site: Secondary | ICD-10-CM | POA: Diagnosis not present

## 2014-11-24 DIAGNOSIS — G250 Essential tremor: Secondary | ICD-10-CM | POA: Diagnosis not present

## 2014-11-27 DIAGNOSIS — G250 Essential tremor: Secondary | ICD-10-CM | POA: Diagnosis not present

## 2014-11-27 DIAGNOSIS — M1990 Unspecified osteoarthritis, unspecified site: Secondary | ICD-10-CM | POA: Diagnosis not present

## 2014-11-27 DIAGNOSIS — G252 Other specified forms of tremor: Secondary | ICD-10-CM | POA: Diagnosis not present

## 2014-11-28 DIAGNOSIS — G250 Essential tremor: Secondary | ICD-10-CM | POA: Diagnosis not present

## 2014-11-28 DIAGNOSIS — G252 Other specified forms of tremor: Secondary | ICD-10-CM | POA: Diagnosis not present

## 2014-11-28 DIAGNOSIS — M1990 Unspecified osteoarthritis, unspecified site: Secondary | ICD-10-CM | POA: Diagnosis not present

## 2014-11-29 ENCOUNTER — Telehealth: Payer: Self-pay | Admitting: *Deleted

## 2014-11-29 DIAGNOSIS — M1990 Unspecified osteoarthritis, unspecified site: Secondary | ICD-10-CM | POA: Diagnosis not present

## 2014-11-29 DIAGNOSIS — G252 Other specified forms of tremor: Secondary | ICD-10-CM | POA: Diagnosis not present

## 2014-11-29 DIAGNOSIS — G250 Essential tremor: Secondary | ICD-10-CM | POA: Diagnosis not present

## 2014-11-30 DIAGNOSIS — M1990 Unspecified osteoarthritis, unspecified site: Secondary | ICD-10-CM | POA: Diagnosis not present

## 2014-11-30 DIAGNOSIS — G252 Other specified forms of tremor: Secondary | ICD-10-CM | POA: Diagnosis not present

## 2014-11-30 DIAGNOSIS — G250 Essential tremor: Secondary | ICD-10-CM | POA: Diagnosis not present

## 2014-12-01 DIAGNOSIS — G250 Essential tremor: Secondary | ICD-10-CM | POA: Diagnosis not present

## 2014-12-01 DIAGNOSIS — M1990 Unspecified osteoarthritis, unspecified site: Secondary | ICD-10-CM | POA: Diagnosis not present

## 2014-12-01 DIAGNOSIS — G252 Other specified forms of tremor: Secondary | ICD-10-CM | POA: Diagnosis not present

## 2014-12-04 DIAGNOSIS — M1990 Unspecified osteoarthritis, unspecified site: Secondary | ICD-10-CM | POA: Diagnosis not present

## 2014-12-04 DIAGNOSIS — G252 Other specified forms of tremor: Secondary | ICD-10-CM | POA: Diagnosis not present

## 2014-12-04 DIAGNOSIS — G250 Essential tremor: Secondary | ICD-10-CM | POA: Diagnosis not present

## 2014-12-05 DIAGNOSIS — M1990 Unspecified osteoarthritis, unspecified site: Secondary | ICD-10-CM | POA: Diagnosis not present

## 2014-12-05 DIAGNOSIS — G250 Essential tremor: Secondary | ICD-10-CM | POA: Diagnosis not present

## 2014-12-05 DIAGNOSIS — G252 Other specified forms of tremor: Secondary | ICD-10-CM | POA: Diagnosis not present

## 2014-12-06 ENCOUNTER — Ambulatory Visit (INDEPENDENT_AMBULATORY_CARE_PROVIDER_SITE_OTHER): Payer: Commercial Managed Care - HMO | Admitting: Internal Medicine

## 2014-12-06 ENCOUNTER — Encounter: Payer: Self-pay | Admitting: Internal Medicine

## 2014-12-06 VITALS — BP 137/72 | HR 72 | Temp 98.0°F | Ht 73.0 in | Wt 216.7 lb

## 2014-12-06 DIAGNOSIS — J449 Chronic obstructive pulmonary disease, unspecified: Secondary | ICD-10-CM

## 2014-12-06 DIAGNOSIS — Z7951 Long term (current) use of inhaled steroids: Secondary | ICD-10-CM | POA: Diagnosis not present

## 2014-12-06 DIAGNOSIS — I10 Essential (primary) hypertension: Secondary | ICD-10-CM

## 2014-12-06 DIAGNOSIS — E785 Hyperlipidemia, unspecified: Secondary | ICD-10-CM

## 2014-12-06 DIAGNOSIS — M170 Bilateral primary osteoarthritis of knee: Secondary | ICD-10-CM

## 2014-12-06 DIAGNOSIS — M25569 Pain in unspecified knee: Secondary | ICD-10-CM | POA: Diagnosis not present

## 2014-12-06 DIAGNOSIS — Z0000 Encounter for general adult medical examination without abnormal findings: Secondary | ICD-10-CM

## 2014-12-06 DIAGNOSIS — G250 Essential tremor: Secondary | ICD-10-CM

## 2014-12-06 DIAGNOSIS — M5406 Panniculitis affecting regions of neck and back, lumbar region: Secondary | ICD-10-CM | POA: Diagnosis not present

## 2014-12-06 DIAGNOSIS — M5417 Radiculopathy, lumbosacral region: Secondary | ICD-10-CM

## 2014-12-06 DIAGNOSIS — E119 Type 2 diabetes mellitus without complications: Secondary | ICD-10-CM

## 2014-12-06 DIAGNOSIS — Z79899 Other long term (current) drug therapy: Secondary | ICD-10-CM

## 2014-12-06 DIAGNOSIS — M2550 Pain in unspecified joint: Secondary | ICD-10-CM | POA: Diagnosis not present

## 2014-12-06 DIAGNOSIS — G252 Other specified forms of tremor: Secondary | ICD-10-CM | POA: Diagnosis not present

## 2014-12-06 DIAGNOSIS — Z79891 Long term (current) use of opiate analgesic: Secondary | ICD-10-CM

## 2014-12-06 DIAGNOSIS — M1990 Unspecified osteoarthritis, unspecified site: Secondary | ICD-10-CM | POA: Diagnosis not present

## 2014-12-06 LAB — BASIC METABOLIC PANEL WITH GFR
BUN: 13 mg/dL (ref 6–23)
CHLORIDE: 104 meq/L (ref 96–112)
CO2: 26 mEq/L (ref 19–32)
CREATININE: 1 mg/dL (ref 0.50–1.35)
Calcium: 9.2 mg/dL (ref 8.4–10.5)
GFR, EST NON AFRICAN AMERICAN: 79 mL/min
Glucose, Bld: 138 mg/dL — ABNORMAL HIGH (ref 70–99)
Potassium: 3.9 mEq/L (ref 3.5–5.3)
Sodium: 140 mEq/L (ref 135–145)

## 2014-12-06 LAB — GLUCOSE, CAPILLARY: Glucose-Capillary: 183 mg/dL — ABNORMAL HIGH (ref 65–99)

## 2014-12-06 LAB — POCT GLYCOSYLATED HEMOGLOBIN (HGB A1C): Hemoglobin A1C: 7

## 2014-12-06 MED ORDER — HYDROCODONE-ACETAMINOPHEN 7.5-325 MG PO TABS: 1.0000 | ORAL_TABLET | Freq: Four times a day (QID) | ORAL | Status: DC | PRN

## 2014-12-06 MED ORDER — ALBUTEROL SULFATE HFA 108 (90 BASE) MCG/ACT IN AERS: 2.0000 | INHALATION_SPRAY | Freq: Four times a day (QID) | RESPIRATORY_TRACT | Status: DC | PRN

## 2014-12-06 NOTE — Progress Notes (Signed)
   Subjective:    Patient ID: [name], male    DOB: [birth year], 64 y.o.   MRN: [id number]  CC: Routine visit for DM  HPI  [name] is a 64yo man with PMH of DM2, COPD stage 3, HTN, ET, HLD, OA to the knees and radiculopathy.  He has been coming to the clinic for a long while, but he is new to me as his PCP.    [name] reports he has knee pain, which is chronic and well controlled with his pain medications.  He had Arthoscopic surgery last year, however, this did not help his symptoms.  He is wearing knee braces.    Toenails blackened and need trimming, he has had both large toenails removed in the past.  He would like to see a podiatrist.   Got sick yesterday due to not eating well yesterday.  Had BM and vomiting at same time.  This has resolved.  After [employer] meal.  Happened right after eating.  Self limitied  He did not bring in his medications today.    Quit smoking in 2008.  Previously smoked 1.5 PPD for 42 years.  This would give him a 63 pack year history and he quit 8 years ago.  He would meet criteria for low dose CT scan of the lungs for screening of lung cancer.   Review of Systems  Constitutional: Negative for fever, chills and fatigue.  HENT: Negative for ear pain and facial swelling.   Eyes: Negative for photophobia and visual disturbance.  Respiratory: Negative for cough, choking and chest tightness.   Cardiovascular: Negative for chest pain and leg swelling.  Gastrointestinal: Negative for abdominal pain, diarrhea and constipation.  Genitourinary: Negative for frequency, enuresis and difficulty urinating.  Musculoskeletal: Positive for arthralgias (knees) and gait problem (due to knee pain). Negative for joint swelling.  Skin: Negative for rash and wound.  Neurological: Negative for dizziness, light-headedness and headaches.       Objective:   Physical Exam  Constitutional: He is oriented to person, place, and time. He appears  well-developed and well-nourished. No distress.  HENT:  Head: Normocephalic and atraumatic.  Eyes: Conjunctivae are normal. No scleral icterus.  Cardiovascular: Normal rate, regular rhythm, normal heart sounds and intact distal pulses.   No murmur heard. Pulmonary/Chest: Effort normal.  Breath sounds decreased in upper lung fields, no wheezing noted.   Abdominal: Soft. Bowel sounds are normal. He exhibits no distension. There is no tenderness.  Musculoskeletal: He exhibits no edema or tenderness.  Wearing knee braces bilaterally.   Neurological: He is alert and oriented to person, place, and time.  Skin: Skin is warm and dry. No rash noted. No erythema.  Psychiatric: He has a normal mood and affect. His behavior is normal.    BMET, HIV, MAU/Cr today      Assessment & Plan:  RTC in 4 months, sooner if needed

## 2014-12-06 NOTE — Telephone Encounter (Signed)
review 

## 2014-12-06 NOTE — Patient Instructions (Signed)
General Instructions: Please schedule a follow up visit within the next 4 months for your diabetes.   For your medications:   Please bring all of your pill  Bottles with you to each visit.  This will help make sure that we have an up to date list of all the medications you are taking.  Please also bring any over the counter herbal medications you are taking (not including advil, tylenol, etc.)  Please continue taking all of your medications as prescribed.  Please bring your pill bottles next time so that we can review them.   You will have a referral to the podiatrist for your toenails.   Thank you!  Please call us with any questions.      Treatment Goals:  Goals (1 Years of Data) as of 12/06/14          As of Today 07/06/14 02/01/14 10/19/13     Diet   . Have 3 meals a day          Lifestyle   . Eat Lunch        . Increase physical activity          Result Component   . HEMOGLOBIN A1C < 7.0  7.0 7.8 8.0 7.7      Progress Toward Treatment Goals:  Treatment Goal 12/06/2014  Hemoglobin A1C at goal  Blood pressure at goal    Self Care Goals & Plans:  Self Care Goal 02/13/2014  Manage my medications take my medicines as prescribed; bring my medications to every visit; refill my medications on time  Monitor my health keep track of my blood glucose; bring my glucose meter and log to each visit; check my feet daily  Eat healthy foods eat more vegetables; eat foods that are low in salt; eat baked foods instead of fried foods  Be physically active find an activity I enjoy    Home Blood Glucose Monitoring 03/23/2013  Check my blood sugar once a day  When to check my blood sugar N/A; before meals     Care Management & Community Referrals:  Referral 10/19/2013  Referrals made for care management support none needed

## 2014-12-07 DIAGNOSIS — G250 Essential tremor: Secondary | ICD-10-CM | POA: Diagnosis not present

## 2014-12-07 DIAGNOSIS — M1990 Unspecified osteoarthritis, unspecified site: Secondary | ICD-10-CM | POA: Diagnosis not present

## 2014-12-07 DIAGNOSIS — G252 Other specified forms of tremor: Secondary | ICD-10-CM | POA: Diagnosis not present

## 2014-12-07 LAB — HIV ANTIBODY (ROUTINE TESTING W REFLEX): HIV 1&2 Ab, 4th Generation: NONREACTIVE

## 2014-12-07 NOTE — Assessment & Plan Note (Signed)
Lab Results  Component Value Date   HGBA1C 7.0 12/06/2014   HGBA1C 7.8 07/06/2014   HGBA1C 8.0 02/01/2014     Assessment: Diabetes control: good control (HgbA1C at goal) Progress toward A1C goal:  at goal Comments: A1C improved to goal today, which I congratulated him on.   Plan: Medications:  continue current medications, glipizide, metformin Home glucose monitoring: Frequency:   Timing:   Instruction/counseling given: reminded to get eye exam and discussed diet Educational resources provided: brochure Self management tools provided: home glucose logbook Other plans:  LDL at goal, he is on pravastatin BP is at goal of 137/72 Eye exam is due, requested he make appointment Foot exam today, no neuropathy, pulses were palpable.  MAU/Cr, BMET today.

## 2014-12-07 NOTE — Assessment & Plan Note (Signed)
Not currently causing any symptoms.  He is on chronic pain medications with [place].  Refilled today.  He has a pain contract.  Consider tox screen at next visit.

## 2014-12-07 NOTE — Assessment & Plan Note (Signed)
LDL at goal.  Continue pravastatin. 

## 2014-12-07 NOTE — Assessment & Plan Note (Signed)
Mildly present today.  He reports that he cannot tell if the propranolol is helping.  Will discuss further with him at next visit.

## 2014-12-07 NOTE — Assessment & Plan Note (Signed)
BP Readings from Last 3 Encounters:  12/06/14 137/72  07/06/14 150/80  02/13/14 128/73    Lab Results  Component Value Date   NA 140 12/06/2014   K 3.9 12/06/2014   CREATININE 1.00 12/06/2014    Assessment: Blood pressure control: controlled Progress toward BP goal:  at goal Comments: Doing well, no complaints  Plan: Medications:  continue current medications, Diltiazem, lisinopril Educational resources provided: brochure Self management tools provided: home blood pressure logbook Other plans: BMET today

## 2014-12-07 NOTE — Assessment & Plan Note (Signed)
Colonoscopy: 2008, repeat in 10 years, due in 2018 CT scan for lung cancer: Discuss at next visit Prevnar at next visit.   PSA/DRE - will need discussion with patient.

## 2014-12-07 NOTE — Assessment & Plan Note (Signed)
Doing very well on Advair and Spiriva.  No complaints today.  Continue current medications.

## 2014-12-08 DIAGNOSIS — M1990 Unspecified osteoarthritis, unspecified site: Secondary | ICD-10-CM | POA: Diagnosis not present

## 2014-12-08 DIAGNOSIS — G250 Essential tremor: Secondary | ICD-10-CM | POA: Diagnosis not present

## 2014-12-08 DIAGNOSIS — G252 Other specified forms of tremor: Secondary | ICD-10-CM | POA: Diagnosis not present

## 2014-12-11 ENCOUNTER — Other Ambulatory Visit: Payer: Self-pay | Admitting: Internal Medicine

## 2014-12-11 DIAGNOSIS — G250 Essential tremor: Secondary | ICD-10-CM | POA: Diagnosis not present

## 2014-12-11 DIAGNOSIS — G252 Other specified forms of tremor: Secondary | ICD-10-CM | POA: Diagnosis not present

## 2014-12-11 DIAGNOSIS — M1990 Unspecified osteoarthritis, unspecified site: Secondary | ICD-10-CM | POA: Diagnosis not present

## 2014-12-12 DIAGNOSIS — M1990 Unspecified osteoarthritis, unspecified site: Secondary | ICD-10-CM | POA: Diagnosis not present

## 2014-12-12 DIAGNOSIS — G250 Essential tremor: Secondary | ICD-10-CM | POA: Diagnosis not present

## 2014-12-12 DIAGNOSIS — G252 Other specified forms of tremor: Secondary | ICD-10-CM | POA: Diagnosis not present

## 2014-12-13 DIAGNOSIS — G250 Essential tremor: Secondary | ICD-10-CM | POA: Diagnosis not present

## 2014-12-13 DIAGNOSIS — G252 Other specified forms of tremor: Secondary | ICD-10-CM | POA: Diagnosis not present

## 2014-12-13 DIAGNOSIS — M1990 Unspecified osteoarthritis, unspecified site: Secondary | ICD-10-CM | POA: Diagnosis not present

## 2014-12-14 DIAGNOSIS — G252 Other specified forms of tremor: Secondary | ICD-10-CM | POA: Diagnosis not present

## 2014-12-14 DIAGNOSIS — G250 Essential tremor: Secondary | ICD-10-CM | POA: Diagnosis not present

## 2014-12-14 DIAGNOSIS — M1990 Unspecified osteoarthritis, unspecified site: Secondary | ICD-10-CM | POA: Diagnosis not present

## 2014-12-15 DIAGNOSIS — G252 Other specified forms of tremor: Secondary | ICD-10-CM | POA: Diagnosis not present

## 2014-12-15 DIAGNOSIS — G250 Essential tremor: Secondary | ICD-10-CM | POA: Diagnosis not present

## 2014-12-15 DIAGNOSIS — M1990 Unspecified osteoarthritis, unspecified site: Secondary | ICD-10-CM | POA: Diagnosis not present

## 2014-12-18 DIAGNOSIS — M1990 Unspecified osteoarthritis, unspecified site: Secondary | ICD-10-CM | POA: Diagnosis not present

## 2014-12-18 DIAGNOSIS — G252 Other specified forms of tremor: Secondary | ICD-10-CM | POA: Diagnosis not present

## 2014-12-18 DIAGNOSIS — G250 Essential tremor: Secondary | ICD-10-CM | POA: Diagnosis not present

## 2014-12-19 DIAGNOSIS — M1990 Unspecified osteoarthritis, unspecified site: Secondary | ICD-10-CM | POA: Diagnosis not present

## 2014-12-19 DIAGNOSIS — G252 Other specified forms of tremor: Secondary | ICD-10-CM | POA: Diagnosis not present

## 2014-12-19 DIAGNOSIS — G250 Essential tremor: Secondary | ICD-10-CM | POA: Diagnosis not present

## 2014-12-20 DIAGNOSIS — M1990 Unspecified osteoarthritis, unspecified site: Secondary | ICD-10-CM | POA: Diagnosis not present

## 2014-12-20 DIAGNOSIS — G252 Other specified forms of tremor: Secondary | ICD-10-CM | POA: Diagnosis not present

## 2014-12-20 DIAGNOSIS — G250 Essential tremor: Secondary | ICD-10-CM | POA: Diagnosis not present

## 2014-12-21 DIAGNOSIS — G252 Other specified forms of tremor: Secondary | ICD-10-CM | POA: Diagnosis not present

## 2014-12-21 DIAGNOSIS — M1990 Unspecified osteoarthritis, unspecified site: Secondary | ICD-10-CM | POA: Diagnosis not present

## 2014-12-21 DIAGNOSIS — G250 Essential tremor: Secondary | ICD-10-CM | POA: Diagnosis not present

## 2014-12-22 DIAGNOSIS — M1990 Unspecified osteoarthritis, unspecified site: Secondary | ICD-10-CM | POA: Diagnosis not present

## 2014-12-22 DIAGNOSIS — G252 Other specified forms of tremor: Secondary | ICD-10-CM | POA: Diagnosis not present

## 2014-12-22 DIAGNOSIS — G250 Essential tremor: Secondary | ICD-10-CM | POA: Diagnosis not present

## 2014-12-26 DIAGNOSIS — G250 Essential tremor: Secondary | ICD-10-CM | POA: Diagnosis not present

## 2014-12-26 DIAGNOSIS — M1990 Unspecified osteoarthritis, unspecified site: Secondary | ICD-10-CM | POA: Diagnosis not present

## 2014-12-26 DIAGNOSIS — G252 Other specified forms of tremor: Secondary | ICD-10-CM | POA: Diagnosis not present

## 2014-12-27 ENCOUNTER — Encounter: Payer: Self-pay | Admitting: Internal Medicine

## 2014-12-27 DIAGNOSIS — I700 Atherosclerosis of aorta: Secondary | ICD-10-CM | POA: Insufficient documentation

## 2014-12-27 DIAGNOSIS — M1990 Unspecified osteoarthritis, unspecified site: Secondary | ICD-10-CM | POA: Diagnosis not present

## 2014-12-27 DIAGNOSIS — G252 Other specified forms of tremor: Secondary | ICD-10-CM | POA: Diagnosis not present

## 2014-12-27 DIAGNOSIS — G250 Essential tremor: Secondary | ICD-10-CM | POA: Diagnosis not present

## 2014-12-28 DIAGNOSIS — M1990 Unspecified osteoarthritis, unspecified site: Secondary | ICD-10-CM | POA: Diagnosis not present

## 2014-12-28 DIAGNOSIS — G250 Essential tremor: Secondary | ICD-10-CM | POA: Diagnosis not present

## 2014-12-28 DIAGNOSIS — G252 Other specified forms of tremor: Secondary | ICD-10-CM | POA: Diagnosis not present

## 2014-12-29 DIAGNOSIS — M1990 Unspecified osteoarthritis, unspecified site: Secondary | ICD-10-CM | POA: Diagnosis not present

## 2014-12-29 DIAGNOSIS — G250 Essential tremor: Secondary | ICD-10-CM | POA: Diagnosis not present

## 2014-12-29 DIAGNOSIS — G252 Other specified forms of tremor: Secondary | ICD-10-CM | POA: Diagnosis not present

## 2015-01-01 DIAGNOSIS — G252 Other specified forms of tremor: Secondary | ICD-10-CM | POA: Diagnosis not present

## 2015-01-01 DIAGNOSIS — G250 Essential tremor: Secondary | ICD-10-CM | POA: Diagnosis not present

## 2015-01-01 DIAGNOSIS — M1990 Unspecified osteoarthritis, unspecified site: Secondary | ICD-10-CM | POA: Diagnosis not present

## 2015-01-02 DIAGNOSIS — G252 Other specified forms of tremor: Secondary | ICD-10-CM | POA: Diagnosis not present

## 2015-01-02 DIAGNOSIS — G250 Essential tremor: Secondary | ICD-10-CM | POA: Diagnosis not present

## 2015-01-02 DIAGNOSIS — M1990 Unspecified osteoarthritis, unspecified site: Secondary | ICD-10-CM | POA: Diagnosis not present

## 2015-01-03 DIAGNOSIS — G250 Essential tremor: Secondary | ICD-10-CM | POA: Diagnosis not present

## 2015-01-03 DIAGNOSIS — G252 Other specified forms of tremor: Secondary | ICD-10-CM | POA: Diagnosis not present

## 2015-01-03 DIAGNOSIS — M1990 Unspecified osteoarthritis, unspecified site: Secondary | ICD-10-CM | POA: Diagnosis not present

## 2015-01-04 DIAGNOSIS — M1990 Unspecified osteoarthritis, unspecified site: Secondary | ICD-10-CM | POA: Diagnosis not present

## 2015-01-04 DIAGNOSIS — G252 Other specified forms of tremor: Secondary | ICD-10-CM | POA: Diagnosis not present

## 2015-01-04 DIAGNOSIS — G250 Essential tremor: Secondary | ICD-10-CM | POA: Diagnosis not present

## 2015-01-05 DIAGNOSIS — G250 Essential tremor: Secondary | ICD-10-CM | POA: Diagnosis not present

## 2015-01-05 DIAGNOSIS — M1990 Unspecified osteoarthritis, unspecified site: Secondary | ICD-10-CM | POA: Diagnosis not present

## 2015-01-05 DIAGNOSIS — G252 Other specified forms of tremor: Secondary | ICD-10-CM | POA: Diagnosis not present

## 2015-01-08 DIAGNOSIS — M1990 Unspecified osteoarthritis, unspecified site: Secondary | ICD-10-CM | POA: Diagnosis not present

## 2015-01-08 DIAGNOSIS — G250 Essential tremor: Secondary | ICD-10-CM | POA: Diagnosis not present

## 2015-01-08 DIAGNOSIS — G252 Other specified forms of tremor: Secondary | ICD-10-CM | POA: Diagnosis not present

## 2015-01-09 DIAGNOSIS — G250 Essential tremor: Secondary | ICD-10-CM | POA: Diagnosis not present

## 2015-01-09 DIAGNOSIS — G252 Other specified forms of tremor: Secondary | ICD-10-CM | POA: Diagnosis not present

## 2015-01-09 DIAGNOSIS — M1990 Unspecified osteoarthritis, unspecified site: Secondary | ICD-10-CM | POA: Diagnosis not present

## 2015-01-10 DIAGNOSIS — M1990 Unspecified osteoarthritis, unspecified site: Secondary | ICD-10-CM | POA: Diagnosis not present

## 2015-01-10 DIAGNOSIS — G250 Essential tremor: Secondary | ICD-10-CM | POA: Diagnosis not present

## 2015-01-10 DIAGNOSIS — G252 Other specified forms of tremor: Secondary | ICD-10-CM | POA: Diagnosis not present

## 2015-01-11 DIAGNOSIS — G252 Other specified forms of tremor: Secondary | ICD-10-CM | POA: Diagnosis not present

## 2015-01-11 DIAGNOSIS — M1990 Unspecified osteoarthritis, unspecified site: Secondary | ICD-10-CM | POA: Diagnosis not present

## 2015-01-11 DIAGNOSIS — G250 Essential tremor: Secondary | ICD-10-CM | POA: Diagnosis not present

## 2015-01-12 DIAGNOSIS — G250 Essential tremor: Secondary | ICD-10-CM | POA: Diagnosis not present

## 2015-01-12 DIAGNOSIS — M1990 Unspecified osteoarthritis, unspecified site: Secondary | ICD-10-CM | POA: Diagnosis not present

## 2015-01-12 DIAGNOSIS — G252 Other specified forms of tremor: Secondary | ICD-10-CM | POA: Diagnosis not present

## 2015-01-15 DIAGNOSIS — G250 Essential tremor: Secondary | ICD-10-CM | POA: Diagnosis not present

## 2015-01-15 DIAGNOSIS — G252 Other specified forms of tremor: Secondary | ICD-10-CM | POA: Diagnosis not present

## 2015-01-15 DIAGNOSIS — M1990 Unspecified osteoarthritis, unspecified site: Secondary | ICD-10-CM | POA: Diagnosis not present

## 2015-01-16 DIAGNOSIS — G252 Other specified forms of tremor: Secondary | ICD-10-CM | POA: Diagnosis not present

## 2015-01-16 DIAGNOSIS — G250 Essential tremor: Secondary | ICD-10-CM | POA: Diagnosis not present

## 2015-01-16 DIAGNOSIS — M1990 Unspecified osteoarthritis, unspecified site: Secondary | ICD-10-CM | POA: Diagnosis not present

## 2015-01-17 ENCOUNTER — Other Ambulatory Visit: Payer: Self-pay | Admitting: *Deleted

## 2015-01-17 DIAGNOSIS — M1990 Unspecified osteoarthritis, unspecified site: Secondary | ICD-10-CM | POA: Diagnosis not present

## 2015-01-17 DIAGNOSIS — G250 Essential tremor: Secondary | ICD-10-CM | POA: Diagnosis not present

## 2015-01-17 DIAGNOSIS — G252 Other specified forms of tremor: Secondary | ICD-10-CM | POA: Diagnosis not present

## 2015-01-18 DIAGNOSIS — M1990 Unspecified osteoarthritis, unspecified site: Secondary | ICD-10-CM | POA: Diagnosis not present

## 2015-01-18 DIAGNOSIS — G250 Essential tremor: Secondary | ICD-10-CM | POA: Diagnosis not present

## 2015-01-18 DIAGNOSIS — G252 Other specified forms of tremor: Secondary | ICD-10-CM | POA: Diagnosis not present

## 2015-01-18 MED ORDER — METFORMIN HCL 1000 MG PO TABS: 1000.0000 mg | ORAL_TABLET | Freq: Two times a day (BID) | ORAL | Status: DC

## 2015-01-19 DIAGNOSIS — M1990 Unspecified osteoarthritis, unspecified site: Secondary | ICD-10-CM | POA: Diagnosis not present

## 2015-01-19 DIAGNOSIS — G250 Essential tremor: Secondary | ICD-10-CM | POA: Diagnosis not present

## 2015-01-19 DIAGNOSIS — G252 Other specified forms of tremor: Secondary | ICD-10-CM | POA: Diagnosis not present

## 2015-01-22 DIAGNOSIS — G252 Other specified forms of tremor: Secondary | ICD-10-CM | POA: Diagnosis not present

## 2015-01-22 DIAGNOSIS — M1990 Unspecified osteoarthritis, unspecified site: Secondary | ICD-10-CM | POA: Diagnosis not present

## 2015-01-22 DIAGNOSIS — G250 Essential tremor: Secondary | ICD-10-CM | POA: Diagnosis not present

## 2015-01-23 DIAGNOSIS — G250 Essential tremor: Secondary | ICD-10-CM | POA: Diagnosis not present

## 2015-01-23 DIAGNOSIS — M1990 Unspecified osteoarthritis, unspecified site: Secondary | ICD-10-CM | POA: Diagnosis not present

## 2015-01-23 DIAGNOSIS — G252 Other specified forms of tremor: Secondary | ICD-10-CM | POA: Diagnosis not present

## 2015-01-24 DIAGNOSIS — M1990 Unspecified osteoarthritis, unspecified site: Secondary | ICD-10-CM | POA: Diagnosis not present

## 2015-01-24 DIAGNOSIS — G252 Other specified forms of tremor: Secondary | ICD-10-CM | POA: Diagnosis not present

## 2015-01-24 DIAGNOSIS — G250 Essential tremor: Secondary | ICD-10-CM | POA: Diagnosis not present

## 2015-01-25 DIAGNOSIS — M1990 Unspecified osteoarthritis, unspecified site: Secondary | ICD-10-CM | POA: Diagnosis not present

## 2015-01-25 DIAGNOSIS — G252 Other specified forms of tremor: Secondary | ICD-10-CM | POA: Diagnosis not present

## 2015-01-25 DIAGNOSIS — G250 Essential tremor: Secondary | ICD-10-CM | POA: Diagnosis not present

## 2015-01-26 DIAGNOSIS — G252 Other specified forms of tremor: Secondary | ICD-10-CM | POA: Diagnosis not present

## 2015-01-26 DIAGNOSIS — G250 Essential tremor: Secondary | ICD-10-CM | POA: Diagnosis not present

## 2015-01-26 DIAGNOSIS — M1990 Unspecified osteoarthritis, unspecified site: Secondary | ICD-10-CM | POA: Diagnosis not present

## 2015-01-29 DIAGNOSIS — G252 Other specified forms of tremor: Secondary | ICD-10-CM | POA: Diagnosis not present

## 2015-01-29 DIAGNOSIS — M1990 Unspecified osteoarthritis, unspecified site: Secondary | ICD-10-CM | POA: Diagnosis not present

## 2015-01-29 DIAGNOSIS — G250 Essential tremor: Secondary | ICD-10-CM | POA: Diagnosis not present

## 2015-01-30 DIAGNOSIS — G252 Other specified forms of tremor: Secondary | ICD-10-CM | POA: Diagnosis not present

## 2015-01-30 DIAGNOSIS — G250 Essential tremor: Secondary | ICD-10-CM | POA: Diagnosis not present

## 2015-01-30 DIAGNOSIS — M1990 Unspecified osteoarthritis, unspecified site: Secondary | ICD-10-CM | POA: Diagnosis not present

## 2015-01-31 DIAGNOSIS — L602 Onychogryphosis: Secondary | ICD-10-CM | POA: Diagnosis not present

## 2015-01-31 DIAGNOSIS — G252 Other specified forms of tremor: Secondary | ICD-10-CM | POA: Diagnosis not present

## 2015-01-31 DIAGNOSIS — L84 Corns and callosities: Secondary | ICD-10-CM | POA: Diagnosis not present

## 2015-01-31 DIAGNOSIS — M1990 Unspecified osteoarthritis, unspecified site: Secondary | ICD-10-CM | POA: Diagnosis not present

## 2015-01-31 DIAGNOSIS — E1151 Type 2 diabetes mellitus with diabetic peripheral angiopathy without gangrene: Secondary | ICD-10-CM | POA: Diagnosis not present

## 2015-01-31 DIAGNOSIS — G250 Essential tremor: Secondary | ICD-10-CM | POA: Diagnosis not present

## 2015-02-01 DIAGNOSIS — M1990 Unspecified osteoarthritis, unspecified site: Secondary | ICD-10-CM | POA: Diagnosis not present

## 2015-02-01 DIAGNOSIS — G250 Essential tremor: Secondary | ICD-10-CM | POA: Diagnosis not present

## 2015-02-01 DIAGNOSIS — G252 Other specified forms of tremor: Secondary | ICD-10-CM | POA: Diagnosis not present

## 2015-02-02 DIAGNOSIS — G252 Other specified forms of tremor: Secondary | ICD-10-CM | POA: Diagnosis not present

## 2015-02-02 DIAGNOSIS — M1990 Unspecified osteoarthritis, unspecified site: Secondary | ICD-10-CM | POA: Diagnosis not present

## 2015-02-02 DIAGNOSIS — G250 Essential tremor: Secondary | ICD-10-CM | POA: Diagnosis not present

## 2015-02-05 ENCOUNTER — Other Ambulatory Visit: Payer: Self-pay | Admitting: Internal Medicine

## 2015-02-05 DIAGNOSIS — G252 Other specified forms of tremor: Secondary | ICD-10-CM | POA: Diagnosis not present

## 2015-02-05 DIAGNOSIS — M1990 Unspecified osteoarthritis, unspecified site: Secondary | ICD-10-CM | POA: Diagnosis not present

## 2015-02-05 DIAGNOSIS — G250 Essential tremor: Secondary | ICD-10-CM | POA: Diagnosis not present

## 2015-02-05 NOTE — Telephone Encounter (Signed)
Pt had script on hold at pharm, they are filling today, last done 7/15

## 2015-02-05 NOTE — Telephone Encounter (Signed)
Pt called requesting pain med to be filled. °

## 2015-02-06 DIAGNOSIS — G250 Essential tremor: Secondary | ICD-10-CM | POA: Diagnosis not present

## 2015-02-06 DIAGNOSIS — M1990 Unspecified osteoarthritis, unspecified site: Secondary | ICD-10-CM | POA: Diagnosis not present

## 2015-02-06 DIAGNOSIS — G252 Other specified forms of tremor: Secondary | ICD-10-CM | POA: Diagnosis not present

## 2015-02-07 DIAGNOSIS — G252 Other specified forms of tremor: Secondary | ICD-10-CM | POA: Diagnosis not present

## 2015-02-07 DIAGNOSIS — G250 Essential tremor: Secondary | ICD-10-CM | POA: Diagnosis not present

## 2015-02-07 DIAGNOSIS — M1990 Unspecified osteoarthritis, unspecified site: Secondary | ICD-10-CM | POA: Diagnosis not present

## 2015-02-08 DIAGNOSIS — M1990 Unspecified osteoarthritis, unspecified site: Secondary | ICD-10-CM | POA: Diagnosis not present

## 2015-02-08 DIAGNOSIS — G250 Essential tremor: Secondary | ICD-10-CM | POA: Diagnosis not present

## 2015-02-08 DIAGNOSIS — G252 Other specified forms of tremor: Secondary | ICD-10-CM | POA: Diagnosis not present

## 2015-02-09 ENCOUNTER — Emergency Department (HOSPITAL_COMMUNITY)
Admission: EM | Admit: 2015-02-09 | Discharge: 2015-02-09 | Disposition: A | Discharge status: Home / Self Care | Payer: No Typology Code available for payment source | Attending: Emergency Medicine | Admitting: Emergency Medicine

## 2015-02-09 ENCOUNTER — Emergency Department (HOSPITAL_COMMUNITY): Payer: No Typology Code available for payment source

## 2015-02-09 DIAGNOSIS — S79912A Unspecified injury of left hip, initial encounter: Secondary | ICD-10-CM | POA: Insufficient documentation

## 2015-02-09 DIAGNOSIS — G252 Other specified forms of tremor: Secondary | ICD-10-CM | POA: Diagnosis not present

## 2015-02-09 DIAGNOSIS — Z792 Long term (current) use of antibiotics: Secondary | ICD-10-CM | POA: Diagnosis not present

## 2015-02-09 DIAGNOSIS — J449 Chronic obstructive pulmonary disease, unspecified: Secondary | ICD-10-CM | POA: Diagnosis not present

## 2015-02-09 DIAGNOSIS — S4992XA Unspecified injury of left shoulder and upper arm, initial encounter: Secondary | ICD-10-CM | POA: Insufficient documentation

## 2015-02-09 DIAGNOSIS — T148 Other injury of unspecified body region: Secondary | ICD-10-CM | POA: Diagnosis not present

## 2015-02-09 DIAGNOSIS — Z86018 Personal history of other benign neoplasm: Secondary | ICD-10-CM | POA: Diagnosis not present

## 2015-02-09 DIAGNOSIS — G250 Essential tremor: Secondary | ICD-10-CM | POA: Diagnosis not present

## 2015-02-09 DIAGNOSIS — E119 Type 2 diabetes mellitus without complications: Secondary | ICD-10-CM | POA: Insufficient documentation

## 2015-02-09 DIAGNOSIS — Z7982 Long term (current) use of aspirin: Secondary | ICD-10-CM | POA: Insufficient documentation

## 2015-02-09 DIAGNOSIS — M25512 Pain in left shoulder: Secondary | ICD-10-CM | POA: Diagnosis not present

## 2015-02-09 DIAGNOSIS — M25552 Pain in left hip: Secondary | ICD-10-CM

## 2015-02-09 DIAGNOSIS — Z87891 Personal history of nicotine dependence: Secondary | ICD-10-CM | POA: Diagnosis not present

## 2015-02-09 DIAGNOSIS — S199XXA Unspecified injury of neck, initial encounter: Secondary | ICD-10-CM | POA: Diagnosis not present

## 2015-02-09 DIAGNOSIS — I10 Essential (primary) hypertension: Secondary | ICD-10-CM | POA: Diagnosis not present

## 2015-02-09 DIAGNOSIS — Y999 Unspecified external cause status: Secondary | ICD-10-CM | POA: Diagnosis not present

## 2015-02-09 DIAGNOSIS — M179 Osteoarthritis of knee, unspecified: Secondary | ICD-10-CM | POA: Insufficient documentation

## 2015-02-09 DIAGNOSIS — Y92410 Unspecified street and highway as the place of occurrence of the external cause: Secondary | ICD-10-CM | POA: Insufficient documentation

## 2015-02-09 DIAGNOSIS — Z794 Long term (current) use of insulin: Secondary | ICD-10-CM | POA: Diagnosis not present

## 2015-02-09 DIAGNOSIS — Z79899 Other long term (current) drug therapy: Secondary | ICD-10-CM | POA: Insufficient documentation

## 2015-02-09 DIAGNOSIS — M79622 Pain in left upper arm: Secondary | ICD-10-CM

## 2015-02-09 DIAGNOSIS — Y9389 Activity, other specified: Secondary | ICD-10-CM | POA: Diagnosis not present

## 2015-02-09 DIAGNOSIS — M1990 Unspecified osteoarthritis, unspecified site: Secondary | ICD-10-CM | POA: Diagnosis not present

## 2015-02-09 DIAGNOSIS — E785 Hyperlipidemia, unspecified: Secondary | ICD-10-CM | POA: Insufficient documentation

## 2015-02-09 DIAGNOSIS — M542 Cervicalgia: Secondary | ICD-10-CM | POA: Diagnosis not present

## 2015-02-09 MED ORDER — TRAMADOL HCL 50 MG PO TABS: 50.0000 mg | ORAL_TABLET | Freq: Four times a day (QID) | ORAL | Status: DC | PRN

## 2015-02-09 MED ORDER — OXYCODONE-ACETAMINOPHEN 5-325 MG PO TABS
1.0000 | ORAL_TABLET | Freq: Once | ORAL | Status: AC
  Administered 2015-02-09: 1 via ORAL
  Filled 2015-02-09: qty 1
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
## 2015-02-09 NOTE — Discharge Instructions (Signed)

## 2015-02-09 NOTE — ED Notes (Signed)
Pt. Stated understanding of d/c instructions, is ambulatory and stable
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
## 2015-02-12 DIAGNOSIS — G252 Other specified forms of tremor: Secondary | ICD-10-CM | POA: Diagnosis not present

## 2015-02-12 DIAGNOSIS — G250 Essential tremor: Secondary | ICD-10-CM | POA: Diagnosis not present

## 2015-02-12 DIAGNOSIS — M1990 Unspecified osteoarthritis, unspecified site: Secondary | ICD-10-CM | POA: Diagnosis not present

## 2015-02-13 DIAGNOSIS — G252 Other specified forms of tremor: Secondary | ICD-10-CM | POA: Diagnosis not present

## 2015-02-13 DIAGNOSIS — M1990 Unspecified osteoarthritis, unspecified site: Secondary | ICD-10-CM | POA: Diagnosis not present

## 2015-02-13 DIAGNOSIS — G250 Essential tremor: Secondary | ICD-10-CM | POA: Diagnosis not present

## 2015-02-14 ENCOUNTER — Encounter: Payer: Self-pay | Admitting: Internal Medicine

## 2015-02-14 ENCOUNTER — Ambulatory Visit (INDEPENDENT_AMBULATORY_CARE_PROVIDER_SITE_OTHER): Payer: Commercial Managed Care - HMO | Admitting: Internal Medicine

## 2015-02-14 ENCOUNTER — Ambulatory Visit (HOSPITAL_COMMUNITY)
Admission: RE | Admit: 2015-02-14 | Discharge: 2015-02-14 | Disposition: A | Discharge status: Home / Self Care | Payer: Commercial Managed Care - HMO | Source: Ambulatory Visit | Attending: Internal Medicine | Admitting: Internal Medicine

## 2015-02-14 DIAGNOSIS — M479 Spondylosis, unspecified: Secondary | ICD-10-CM | POA: Diagnosis not present

## 2015-02-14 DIAGNOSIS — M419 Scoliosis, unspecified: Secondary | ICD-10-CM | POA: Insufficient documentation

## 2015-02-14 DIAGNOSIS — M170 Bilateral primary osteoarthritis of knee: Secondary | ICD-10-CM

## 2015-02-14 DIAGNOSIS — M1990 Unspecified osteoarthritis, unspecified site: Secondary | ICD-10-CM | POA: Diagnosis not present

## 2015-02-14 DIAGNOSIS — G252 Other specified forms of tremor: Secondary | ICD-10-CM | POA: Diagnosis not present

## 2015-02-14 DIAGNOSIS — M549 Dorsalgia, unspecified: Secondary | ICD-10-CM | POA: Insufficient documentation

## 2015-02-14 DIAGNOSIS — M25512 Pain in left shoulder: Secondary | ICD-10-CM

## 2015-02-14 DIAGNOSIS — M25519 Pain in unspecified shoulder: Secondary | ICD-10-CM | POA: Insufficient documentation

## 2015-02-14 DIAGNOSIS — S3992XA Unspecified injury of lower back, initial encounter: Secondary | ICD-10-CM | POA: Diagnosis not present

## 2015-02-14 DIAGNOSIS — S299XXA Unspecified injury of thorax, initial encounter: Secondary | ICD-10-CM | POA: Diagnosis not present

## 2015-02-14 DIAGNOSIS — S199XXA Unspecified injury of neck, initial encounter: Secondary | ICD-10-CM | POA: Diagnosis not present

## 2015-02-14 DIAGNOSIS — I700 Atherosclerosis of aorta: Secondary | ICD-10-CM | POA: Insufficient documentation

## 2015-02-14 DIAGNOSIS — M1991 Primary osteoarthritis, unspecified site: Secondary | ICD-10-CM | POA: Diagnosis not present

## 2015-02-14 DIAGNOSIS — M542 Cervicalgia: Secondary | ICD-10-CM | POA: Insufficient documentation

## 2015-02-14 DIAGNOSIS — G250 Essential tremor: Secondary | ICD-10-CM | POA: Diagnosis not present

## 2015-02-14 LAB — GLUCOSE, CAPILLARY: Glucose-Capillary: 228 mg/dL — ABNORMAL HIGH (ref 65–99)

## 2015-02-14 MED ORDER — ORPHENADRINE CITRATE ER 100 MG PO TB12: 100.0000 mg | ORAL_TABLET | Freq: Two times a day (BID) | ORAL | Status: DC | PRN

## 2015-02-14 MED ORDER — IBUPROFEN 600 MG PO TABS: 600.0000 mg | ORAL_TABLET | Freq: Four times a day (QID) | ORAL | Status: DC | PRN

## 2015-02-14 MED ORDER — HYDROCODONE-ACETAMINOPHEN 7.5-325 MG PO TABS: 1.0000 | ORAL_TABLET | Freq: Four times a day (QID) | ORAL | Status: DC | PRN
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
## 2015-02-14 NOTE — Assessment & Plan Note (Signed)
Refill given of his chronic pain medications for osteoarthritis.
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
## 2015-02-15 DIAGNOSIS — G250 Essential tremor: Secondary | ICD-10-CM | POA: Diagnosis not present

## 2015-02-15 DIAGNOSIS — G252 Other specified forms of tremor: Secondary | ICD-10-CM | POA: Diagnosis not present

## 2015-02-15 DIAGNOSIS — M1990 Unspecified osteoarthritis, unspecified site: Secondary | ICD-10-CM | POA: Diagnosis not present

## 2015-02-16 ENCOUNTER — Other Ambulatory Visit: Payer: Self-pay | Admitting: Internal Medicine

## 2015-02-16 DIAGNOSIS — G252 Other specified forms of tremor: Secondary | ICD-10-CM | POA: Diagnosis not present

## 2015-02-16 DIAGNOSIS — M1990 Unspecified osteoarthritis, unspecified site: Secondary | ICD-10-CM | POA: Diagnosis not present

## 2015-02-16 DIAGNOSIS — G250 Essential tremor: Secondary | ICD-10-CM | POA: Diagnosis not present

## 2015-02-19 DIAGNOSIS — M1990 Unspecified osteoarthritis, unspecified site: Secondary | ICD-10-CM | POA: Diagnosis not present

## 2015-02-19 DIAGNOSIS — G250 Essential tremor: Secondary | ICD-10-CM | POA: Diagnosis not present

## 2015-02-19 DIAGNOSIS — G252 Other specified forms of tremor: Secondary | ICD-10-CM | POA: Diagnosis not present

## 2015-02-21 ENCOUNTER — Ambulatory Visit: Payer: Commercial Managed Care - HMO | Admitting: Internal Medicine

## 2015-02-21 ENCOUNTER — Other Ambulatory Visit: Payer: Self-pay | Admitting: Internal Medicine

## 2015-02-22 DIAGNOSIS — M1990 Unspecified osteoarthritis, unspecified site: Secondary | ICD-10-CM | POA: Diagnosis not present

## 2015-02-22 DIAGNOSIS — G250 Essential tremor: Secondary | ICD-10-CM | POA: Diagnosis not present

## 2015-02-22 DIAGNOSIS — G252 Other specified forms of tremor: Secondary | ICD-10-CM | POA: Diagnosis not present

## 2015-02-23 DIAGNOSIS — G252 Other specified forms of tremor: Secondary | ICD-10-CM | POA: Diagnosis not present

## 2015-02-23 DIAGNOSIS — G250 Essential tremor: Secondary | ICD-10-CM | POA: Diagnosis not present

## 2015-02-23 DIAGNOSIS — M1990 Unspecified osteoarthritis, unspecified site: Secondary | ICD-10-CM | POA: Diagnosis not present

## 2015-02-27 ENCOUNTER — Ambulatory Visit (INDEPENDENT_AMBULATORY_CARE_PROVIDER_SITE_OTHER): Payer: Commercial Managed Care - HMO | Admitting: Internal Medicine

## 2015-02-27 VITALS — BP 147/72 | HR 77 | Temp 97.8°F | Wt 219.0 lb

## 2015-02-27 DIAGNOSIS — E119 Type 2 diabetes mellitus without complications: Secondary | ICD-10-CM | POA: Diagnosis not present

## 2015-02-27 DIAGNOSIS — G250 Essential tremor: Secondary | ICD-10-CM | POA: Diagnosis not present

## 2015-02-27 DIAGNOSIS — G252 Other specified forms of tremor: Secondary | ICD-10-CM | POA: Diagnosis not present

## 2015-02-27 DIAGNOSIS — Z23 Encounter for immunization: Secondary | ICD-10-CM

## 2015-02-27 DIAGNOSIS — M25512 Pain in left shoulder: Secondary | ICD-10-CM | POA: Diagnosis not present

## 2015-02-27 DIAGNOSIS — Z7951 Long term (current) use of inhaled steroids: Secondary | ICD-10-CM | POA: Diagnosis not present

## 2015-02-27 DIAGNOSIS — Z0000 Encounter for general adult medical examination without abnormal findings: Secondary | ICD-10-CM

## 2015-02-27 DIAGNOSIS — J449 Chronic obstructive pulmonary disease, unspecified: Secondary | ICD-10-CM

## 2015-02-27 DIAGNOSIS — M1990 Unspecified osteoarthritis, unspecified site: Secondary | ICD-10-CM | POA: Diagnosis not present

## 2015-02-27 MED ORDER — ALBUTEROL SULFATE HFA 108 (90 BASE) MCG/ACT IN AERS: 2.0000 | INHALATION_SPRAY | Freq: Four times a day (QID) | RESPIRATORY_TRACT | Status: DC | PRN
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
## 2015-02-27 NOTE — Assessment & Plan Note (Signed)
Ophtalmology referral for annual eye exam, patient follows with Dr. Daron Offer of [facility].

## 2015-02-27 NOTE — Progress Notes (Signed)
Internal Medicine Clinic Attending  I saw and evaluated the patient.  I personally confirmed the key portions of the history and exam documented by Dr. Patel,Vishal and I reviewed pertinent patient test results.  The assessment, diagnosis, and plan were formulated together and I agree with the documentation in the resident's note.  

## 2015-02-27 NOTE — Assessment & Plan Note (Signed)
Influenza vaccine today and ophtalmology referral for annual eye exam.
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
## 2015-02-27 NOTE — Assessment & Plan Note (Signed)
Refill Albuterol

## 2015-02-27 NOTE — Patient Instructions (Addendum)
Thank you for your visit [name].  Your shoulder pain seems like it is improving, but will take some more time before your pain completely resolves. Please continue your current medications and rest your shoulder with ice, and try movement as you can tolerate. Please call us if your pain should get any worse.  I have sent a prescription for your albuterol inhaler to your pharmacy.  We have also given you your flu shot today.  Please follow up with [place] in 3 weeks, or sooner should your pain get worse.

## 2015-02-28 DIAGNOSIS — G250 Essential tremor: Secondary | ICD-10-CM | POA: Diagnosis not present

## 2015-02-28 DIAGNOSIS — M1990 Unspecified osteoarthritis, unspecified site: Secondary | ICD-10-CM | POA: Diagnosis not present

## 2015-02-28 DIAGNOSIS — G252 Other specified forms of tremor: Secondary | ICD-10-CM | POA: Diagnosis not present

## 2015-03-01 DIAGNOSIS — G250 Essential tremor: Secondary | ICD-10-CM | POA: Diagnosis not present

## 2015-03-01 DIAGNOSIS — G252 Other specified forms of tremor: Secondary | ICD-10-CM | POA: Diagnosis not present

## 2015-03-01 DIAGNOSIS — M1990 Unspecified osteoarthritis, unspecified site: Secondary | ICD-10-CM | POA: Diagnosis not present

## 2015-03-02 DIAGNOSIS — G252 Other specified forms of tremor: Secondary | ICD-10-CM | POA: Diagnosis not present

## 2015-03-02 DIAGNOSIS — G250 Essential tremor: Secondary | ICD-10-CM | POA: Diagnosis not present

## 2015-03-02 DIAGNOSIS — M1990 Unspecified osteoarthritis, unspecified site: Secondary | ICD-10-CM | POA: Diagnosis not present

## 2015-03-05 DIAGNOSIS — M1990 Unspecified osteoarthritis, unspecified site: Secondary | ICD-10-CM | POA: Diagnosis not present

## 2015-03-05 DIAGNOSIS — G250 Essential tremor: Secondary | ICD-10-CM | POA: Diagnosis not present

## 2015-03-05 DIAGNOSIS — G252 Other specified forms of tremor: Secondary | ICD-10-CM | POA: Diagnosis not present

## 2015-03-06 DIAGNOSIS — G250 Essential tremor: Secondary | ICD-10-CM | POA: Diagnosis not present

## 2015-03-06 DIAGNOSIS — G252 Other specified forms of tremor: Secondary | ICD-10-CM | POA: Diagnosis not present

## 2015-03-06 DIAGNOSIS — M1990 Unspecified osteoarthritis, unspecified site: Secondary | ICD-10-CM | POA: Diagnosis not present

## 2015-03-07 DIAGNOSIS — G252 Other specified forms of tremor: Secondary | ICD-10-CM | POA: Diagnosis not present

## 2015-03-07 DIAGNOSIS — G250 Essential tremor: Secondary | ICD-10-CM | POA: Diagnosis not present

## 2015-03-07 DIAGNOSIS — M1990 Unspecified osteoarthritis, unspecified site: Secondary | ICD-10-CM | POA: Diagnosis not present

## 2015-03-08 DIAGNOSIS — G250 Essential tremor: Secondary | ICD-10-CM | POA: Diagnosis not present

## 2015-03-08 DIAGNOSIS — M1990 Unspecified osteoarthritis, unspecified site: Secondary | ICD-10-CM | POA: Diagnosis not present

## 2015-03-08 DIAGNOSIS — G252 Other specified forms of tremor: Secondary | ICD-10-CM | POA: Diagnosis not present

## 2015-03-09 DIAGNOSIS — M1990 Unspecified osteoarthritis, unspecified site: Secondary | ICD-10-CM | POA: Diagnosis not present

## 2015-03-09 DIAGNOSIS — G252 Other specified forms of tremor: Secondary | ICD-10-CM | POA: Diagnosis not present

## 2015-03-09 DIAGNOSIS — G250 Essential tremor: Secondary | ICD-10-CM | POA: Diagnosis not present

## 2015-03-12 DIAGNOSIS — G250 Essential tremor: Secondary | ICD-10-CM | POA: Diagnosis not present

## 2015-03-12 DIAGNOSIS — G252 Other specified forms of tremor: Secondary | ICD-10-CM | POA: Diagnosis not present

## 2015-03-12 DIAGNOSIS — M1990 Unspecified osteoarthritis, unspecified site: Secondary | ICD-10-CM | POA: Diagnosis not present

## 2015-03-13 DIAGNOSIS — M1990 Unspecified osteoarthritis, unspecified site: Secondary | ICD-10-CM | POA: Diagnosis not present

## 2015-03-13 DIAGNOSIS — G250 Essential tremor: Secondary | ICD-10-CM | POA: Diagnosis not present

## 2015-03-13 DIAGNOSIS — G252 Other specified forms of tremor: Secondary | ICD-10-CM | POA: Diagnosis not present

## 2015-03-14 DIAGNOSIS — G250 Essential tremor: Secondary | ICD-10-CM | POA: Diagnosis not present

## 2015-03-14 DIAGNOSIS — G252 Other specified forms of tremor: Secondary | ICD-10-CM | POA: Diagnosis not present

## 2015-03-14 DIAGNOSIS — M1990 Unspecified osteoarthritis, unspecified site: Secondary | ICD-10-CM | POA: Diagnosis not present

## 2015-03-15 DIAGNOSIS — G252 Other specified forms of tremor: Secondary | ICD-10-CM | POA: Diagnosis not present

## 2015-03-15 DIAGNOSIS — M1990 Unspecified osteoarthritis, unspecified site: Secondary | ICD-10-CM | POA: Diagnosis not present

## 2015-03-15 DIAGNOSIS — G250 Essential tremor: Secondary | ICD-10-CM | POA: Diagnosis not present

## 2015-03-16 DIAGNOSIS — G252 Other specified forms of tremor: Secondary | ICD-10-CM | POA: Diagnosis not present

## 2015-03-16 DIAGNOSIS — M1990 Unspecified osteoarthritis, unspecified site: Secondary | ICD-10-CM | POA: Diagnosis not present

## 2015-03-16 DIAGNOSIS — G250 Essential tremor: Secondary | ICD-10-CM | POA: Diagnosis not present

## 2015-03-19 DIAGNOSIS — G250 Essential tremor: Secondary | ICD-10-CM | POA: Diagnosis not present

## 2015-03-19 DIAGNOSIS — G252 Other specified forms of tremor: Secondary | ICD-10-CM | POA: Diagnosis not present

## 2015-03-19 DIAGNOSIS — M1990 Unspecified osteoarthritis, unspecified site: Secondary | ICD-10-CM | POA: Diagnosis not present

## 2015-03-20 DIAGNOSIS — G250 Essential tremor: Secondary | ICD-10-CM | POA: Diagnosis not present

## 2015-03-20 DIAGNOSIS — G252 Other specified forms of tremor: Secondary | ICD-10-CM | POA: Diagnosis not present

## 2015-03-20 DIAGNOSIS — M1990 Unspecified osteoarthritis, unspecified site: Secondary | ICD-10-CM | POA: Diagnosis not present

## 2015-03-21 DIAGNOSIS — G250 Essential tremor: Secondary | ICD-10-CM | POA: Diagnosis not present

## 2015-03-21 DIAGNOSIS — G252 Other specified forms of tremor: Secondary | ICD-10-CM | POA: Diagnosis not present

## 2015-03-21 DIAGNOSIS — M1990 Unspecified osteoarthritis, unspecified site: Secondary | ICD-10-CM | POA: Diagnosis not present

## 2015-03-22 DIAGNOSIS — M1990 Unspecified osteoarthritis, unspecified site: Secondary | ICD-10-CM | POA: Diagnosis not present

## 2015-03-22 DIAGNOSIS — G250 Essential tremor: Secondary | ICD-10-CM | POA: Diagnosis not present

## 2015-03-22 DIAGNOSIS — G252 Other specified forms of tremor: Secondary | ICD-10-CM | POA: Diagnosis not present

## 2015-03-23 DIAGNOSIS — G250 Essential tremor: Secondary | ICD-10-CM | POA: Diagnosis not present

## 2015-03-23 DIAGNOSIS — G252 Other specified forms of tremor: Secondary | ICD-10-CM | POA: Diagnosis not present

## 2015-03-23 DIAGNOSIS — M1990 Unspecified osteoarthritis, unspecified site: Secondary | ICD-10-CM | POA: Diagnosis not present

## 2015-03-26 DIAGNOSIS — M1990 Unspecified osteoarthritis, unspecified site: Secondary | ICD-10-CM | POA: Diagnosis not present

## 2015-03-26 DIAGNOSIS — G252 Other specified forms of tremor: Secondary | ICD-10-CM | POA: Diagnosis not present

## 2015-03-26 DIAGNOSIS — G250 Essential tremor: Secondary | ICD-10-CM | POA: Diagnosis not present

## 2015-03-27 DIAGNOSIS — G252 Other specified forms of tremor: Secondary | ICD-10-CM | POA: Diagnosis not present

## 2015-03-27 DIAGNOSIS — G250 Essential tremor: Secondary | ICD-10-CM | POA: Diagnosis not present

## 2015-03-27 DIAGNOSIS — M1990 Unspecified osteoarthritis, unspecified site: Secondary | ICD-10-CM | POA: Diagnosis not present

## 2015-03-28 DIAGNOSIS — G250 Essential tremor: Secondary | ICD-10-CM | POA: Diagnosis not present

## 2015-03-28 DIAGNOSIS — G252 Other specified forms of tremor: Secondary | ICD-10-CM | POA: Diagnosis not present

## 2015-03-28 DIAGNOSIS — M1990 Unspecified osteoarthritis, unspecified site: Secondary | ICD-10-CM | POA: Diagnosis not present

## 2015-03-29 DIAGNOSIS — M1990 Unspecified osteoarthritis, unspecified site: Secondary | ICD-10-CM | POA: Diagnosis not present

## 2015-03-29 DIAGNOSIS — G252 Other specified forms of tremor: Secondary | ICD-10-CM | POA: Diagnosis not present

## 2015-03-29 DIAGNOSIS — G250 Essential tremor: Secondary | ICD-10-CM | POA: Diagnosis not present

## 2015-03-30 ENCOUNTER — Telehealth: Payer: Self-pay | Admitting: Internal Medicine

## 2015-03-30 DIAGNOSIS — M1990 Unspecified osteoarthritis, unspecified site: Secondary | ICD-10-CM | POA: Diagnosis not present

## 2015-03-30 DIAGNOSIS — G250 Essential tremor: Secondary | ICD-10-CM | POA: Diagnosis not present

## 2015-03-30 DIAGNOSIS — G252 Other specified forms of tremor: Secondary | ICD-10-CM | POA: Diagnosis not present

## 2015-03-30 NOTE — Telephone Encounter (Signed)
Pat's last EYE EXAM being faxed from [facility]/12/19/2013.  Pt has no future appts sch at this time.

## 2015-04-02 DIAGNOSIS — G252 Other specified forms of tremor: Secondary | ICD-10-CM | POA: Diagnosis not present

## 2015-04-02 DIAGNOSIS — M1990 Unspecified osteoarthritis, unspecified site: Secondary | ICD-10-CM | POA: Diagnosis not present

## 2015-04-02 DIAGNOSIS — G250 Essential tremor: Secondary | ICD-10-CM | POA: Diagnosis not present

## 2015-04-03 DIAGNOSIS — G252 Other specified forms of tremor: Secondary | ICD-10-CM | POA: Diagnosis not present

## 2015-04-03 DIAGNOSIS — G250 Essential tremor: Secondary | ICD-10-CM | POA: Diagnosis not present

## 2015-04-03 DIAGNOSIS — M1990 Unspecified osteoarthritis, unspecified site: Secondary | ICD-10-CM | POA: Diagnosis not present

## 2015-04-04 ENCOUNTER — Other Ambulatory Visit: Payer: Self-pay | Admitting: Internal Medicine

## 2015-04-04 DIAGNOSIS — M170 Bilateral primary osteoarthritis of knee: Secondary | ICD-10-CM

## 2015-04-04 DIAGNOSIS — G250 Essential tremor: Secondary | ICD-10-CM | POA: Diagnosis not present

## 2015-04-04 DIAGNOSIS — M1990 Unspecified osteoarthritis, unspecified site: Secondary | ICD-10-CM | POA: Diagnosis not present

## 2015-04-04 DIAGNOSIS — G252 Other specified forms of tremor: Secondary | ICD-10-CM | POA: Diagnosis not present

## 2015-04-04 MED ORDER — HYDROCODONE-ACETAMINOPHEN 7.5-325 MG PO TABS
1.0000 | ORAL_TABLET | Freq: Four times a day (QID) | ORAL | Status: DC | PRN
Start: 2015-04-04 — End: 2015-05-03

## 2015-04-04 NOTE — Telephone Encounter (Signed)
Pt informed

## 2015-04-04 NOTE — Telephone Encounter (Signed)
Last filled in clinic 8/24 No future appt Last uds, cannot find

## 2015-04-04 NOTE — Telephone Encounter (Signed)
Pt called requesting hydrocodone to be filled.  °

## 2015-04-05 DIAGNOSIS — M1990 Unspecified osteoarthritis, unspecified site: Secondary | ICD-10-CM | POA: Diagnosis not present

## 2015-04-05 DIAGNOSIS — G252 Other specified forms of tremor: Secondary | ICD-10-CM | POA: Diagnosis not present

## 2015-04-05 DIAGNOSIS — G250 Essential tremor: Secondary | ICD-10-CM | POA: Diagnosis not present

## 2015-04-06 DIAGNOSIS — G250 Essential tremor: Secondary | ICD-10-CM | POA: Diagnosis not present

## 2015-04-06 DIAGNOSIS — G252 Other specified forms of tremor: Secondary | ICD-10-CM | POA: Diagnosis not present

## 2015-04-06 DIAGNOSIS — M1990 Unspecified osteoarthritis, unspecified site: Secondary | ICD-10-CM | POA: Diagnosis not present

## 2015-04-09 DIAGNOSIS — G250 Essential tremor: Secondary | ICD-10-CM | POA: Diagnosis not present

## 2015-04-09 DIAGNOSIS — M1990 Unspecified osteoarthritis, unspecified site: Secondary | ICD-10-CM | POA: Diagnosis not present

## 2015-04-09 DIAGNOSIS — G252 Other specified forms of tremor: Secondary | ICD-10-CM | POA: Diagnosis not present

## 2015-04-10 DIAGNOSIS — G252 Other specified forms of tremor: Secondary | ICD-10-CM | POA: Diagnosis not present

## 2015-04-10 DIAGNOSIS — G250 Essential tremor: Secondary | ICD-10-CM | POA: Diagnosis not present

## 2015-04-10 DIAGNOSIS — M1990 Unspecified osteoarthritis, unspecified site: Secondary | ICD-10-CM | POA: Diagnosis not present

## 2015-04-11 DIAGNOSIS — G252 Other specified forms of tremor: Secondary | ICD-10-CM | POA: Diagnosis not present

## 2015-04-11 DIAGNOSIS — M1990 Unspecified osteoarthritis, unspecified site: Secondary | ICD-10-CM | POA: Diagnosis not present

## 2015-04-11 DIAGNOSIS — G250 Essential tremor: Secondary | ICD-10-CM | POA: Diagnosis not present

## 2015-04-12 DIAGNOSIS — G252 Other specified forms of tremor: Secondary | ICD-10-CM | POA: Diagnosis not present

## 2015-04-12 DIAGNOSIS — M1990 Unspecified osteoarthritis, unspecified site: Secondary | ICD-10-CM | POA: Diagnosis not present

## 2015-04-12 DIAGNOSIS — G250 Essential tremor: Secondary | ICD-10-CM | POA: Diagnosis not present

## 2015-04-13 DIAGNOSIS — G252 Other specified forms of tremor: Secondary | ICD-10-CM | POA: Diagnosis not present

## 2015-04-13 DIAGNOSIS — M1990 Unspecified osteoarthritis, unspecified site: Secondary | ICD-10-CM | POA: Diagnosis not present

## 2015-04-13 DIAGNOSIS — G250 Essential tremor: Secondary | ICD-10-CM | POA: Diagnosis not present

## 2015-04-16 DIAGNOSIS — G250 Essential tremor: Secondary | ICD-10-CM | POA: Diagnosis not present

## 2015-04-16 DIAGNOSIS — M1990 Unspecified osteoarthritis, unspecified site: Secondary | ICD-10-CM | POA: Diagnosis not present

## 2015-04-16 DIAGNOSIS — G252 Other specified forms of tremor: Secondary | ICD-10-CM | POA: Diagnosis not present

## 2015-04-17 DIAGNOSIS — M1990 Unspecified osteoarthritis, unspecified site: Secondary | ICD-10-CM | POA: Diagnosis not present

## 2015-04-17 DIAGNOSIS — G250 Essential tremor: Secondary | ICD-10-CM | POA: Diagnosis not present

## 2015-04-17 DIAGNOSIS — G252 Other specified forms of tremor: Secondary | ICD-10-CM | POA: Diagnosis not present

## 2015-04-18 DIAGNOSIS — G252 Other specified forms of tremor: Secondary | ICD-10-CM | POA: Diagnosis not present

## 2015-04-18 DIAGNOSIS — G250 Essential tremor: Secondary | ICD-10-CM | POA: Diagnosis not present

## 2015-04-18 DIAGNOSIS — M1990 Unspecified osteoarthritis, unspecified site: Secondary | ICD-10-CM | POA: Diagnosis not present

## 2015-04-19 DIAGNOSIS — G252 Other specified forms of tremor: Secondary | ICD-10-CM | POA: Diagnosis not present

## 2015-04-19 DIAGNOSIS — G250 Essential tremor: Secondary | ICD-10-CM | POA: Diagnosis not present

## 2015-04-19 DIAGNOSIS — M1990 Unspecified osteoarthritis, unspecified site: Secondary | ICD-10-CM | POA: Diagnosis not present

## 2015-04-20 DIAGNOSIS — G252 Other specified forms of tremor: Secondary | ICD-10-CM | POA: Diagnosis not present

## 2015-04-20 DIAGNOSIS — M1990 Unspecified osteoarthritis, unspecified site: Secondary | ICD-10-CM | POA: Diagnosis not present

## 2015-04-20 DIAGNOSIS — G250 Essential tremor: Secondary | ICD-10-CM | POA: Diagnosis not present

## 2015-04-23 DIAGNOSIS — G252 Other specified forms of tremor: Secondary | ICD-10-CM | POA: Diagnosis not present

## 2015-04-23 DIAGNOSIS — G250 Essential tremor: Secondary | ICD-10-CM | POA: Diagnosis not present

## 2015-04-23 DIAGNOSIS — M1990 Unspecified osteoarthritis, unspecified site: Secondary | ICD-10-CM | POA: Diagnosis not present

## 2015-04-24 DIAGNOSIS — M1990 Unspecified osteoarthritis, unspecified site: Secondary | ICD-10-CM | POA: Diagnosis not present

## 2015-04-24 DIAGNOSIS — G252 Other specified forms of tremor: Secondary | ICD-10-CM | POA: Diagnosis not present

## 2015-04-24 DIAGNOSIS — G250 Essential tremor: Secondary | ICD-10-CM | POA: Diagnosis not present

## 2015-04-25 ENCOUNTER — Other Ambulatory Visit: Payer: Self-pay | Admitting: Internal Medicine

## 2015-04-25 DIAGNOSIS — M1990 Unspecified osteoarthritis, unspecified site: Secondary | ICD-10-CM | POA: Diagnosis not present

## 2015-04-25 DIAGNOSIS — G250 Essential tremor: Secondary | ICD-10-CM | POA: Diagnosis not present

## 2015-04-25 DIAGNOSIS — G252 Other specified forms of tremor: Secondary | ICD-10-CM | POA: Diagnosis not present

## 2015-04-26 DIAGNOSIS — G252 Other specified forms of tremor: Secondary | ICD-10-CM | POA: Diagnosis not present

## 2015-04-26 DIAGNOSIS — M1990 Unspecified osteoarthritis, unspecified site: Secondary | ICD-10-CM | POA: Diagnosis not present

## 2015-04-26 DIAGNOSIS — G250 Essential tremor: Secondary | ICD-10-CM | POA: Diagnosis not present

## 2015-04-27 ENCOUNTER — Other Ambulatory Visit: Payer: Self-pay | Admitting: *Deleted

## 2015-04-27 ENCOUNTER — Other Ambulatory Visit: Payer: Self-pay | Admitting: Internal Medicine

## 2015-04-27 DIAGNOSIS — I10 Essential (primary) hypertension: Secondary | ICD-10-CM

## 2015-04-27 DIAGNOSIS — M1990 Unspecified osteoarthritis, unspecified site: Secondary | ICD-10-CM | POA: Diagnosis not present

## 2015-04-27 DIAGNOSIS — G250 Essential tremor: Secondary | ICD-10-CM | POA: Diagnosis not present

## 2015-04-27 DIAGNOSIS — G252 Other specified forms of tremor: Secondary | ICD-10-CM | POA: Diagnosis not present

## 2015-04-27 NOTE — Telephone Encounter (Signed)
[facility] pharmacy call regarding pt Rx. Please call back.

## 2015-04-30 DIAGNOSIS — G252 Other specified forms of tremor: Secondary | ICD-10-CM | POA: Diagnosis not present

## 2015-04-30 DIAGNOSIS — M1990 Unspecified osteoarthritis, unspecified site: Secondary | ICD-10-CM | POA: Diagnosis not present

## 2015-04-30 DIAGNOSIS — G250 Essential tremor: Secondary | ICD-10-CM | POA: Diagnosis not present

## 2015-04-30 MED ORDER — LISINOPRIL 10 MG PO TABS: 20.0000 mg | ORAL_TABLET | Freq: Every day | ORAL | Status: DC

## 2015-04-30 NOTE — Telephone Encounter (Signed)
rx phoned in

## 2015-05-01 DIAGNOSIS — G250 Essential tremor: Secondary | ICD-10-CM | POA: Diagnosis not present

## 2015-05-01 DIAGNOSIS — G252 Other specified forms of tremor: Secondary | ICD-10-CM | POA: Diagnosis not present

## 2015-05-01 DIAGNOSIS — M1990 Unspecified osteoarthritis, unspecified site: Secondary | ICD-10-CM | POA: Diagnosis not present

## 2015-05-02 DIAGNOSIS — M1990 Unspecified osteoarthritis, unspecified site: Secondary | ICD-10-CM | POA: Diagnosis not present

## 2015-05-02 DIAGNOSIS — G250 Essential tremor: Secondary | ICD-10-CM | POA: Diagnosis not present

## 2015-05-02 DIAGNOSIS — G252 Other specified forms of tremor: Secondary | ICD-10-CM | POA: Diagnosis not present

## 2015-05-03 ENCOUNTER — Other Ambulatory Visit: Payer: Self-pay | Admitting: Internal Medicine

## 2015-05-03 DIAGNOSIS — G250 Essential tremor: Secondary | ICD-10-CM | POA: Diagnosis not present

## 2015-05-03 DIAGNOSIS — G252 Other specified forms of tremor: Secondary | ICD-10-CM | POA: Diagnosis not present

## 2015-05-03 DIAGNOSIS — M170 Bilateral primary osteoarthritis of knee: Secondary | ICD-10-CM

## 2015-05-03 DIAGNOSIS — M1990 Unspecified osteoarthritis, unspecified site: Secondary | ICD-10-CM | POA: Diagnosis not present

## 2015-05-03 NOTE — Telephone Encounter (Signed)
Pt requesting hydrocodone to be filled.  °

## 2015-05-04 DIAGNOSIS — G252 Other specified forms of tremor: Secondary | ICD-10-CM | POA: Diagnosis not present

## 2015-05-04 DIAGNOSIS — M1990 Unspecified osteoarthritis, unspecified site: Secondary | ICD-10-CM | POA: Diagnosis not present

## 2015-05-04 DIAGNOSIS — G250 Essential tremor: Secondary | ICD-10-CM | POA: Diagnosis not present

## 2015-05-07 DIAGNOSIS — G250 Essential tremor: Secondary | ICD-10-CM | POA: Diagnosis not present

## 2015-05-07 DIAGNOSIS — G252 Other specified forms of tremor: Secondary | ICD-10-CM | POA: Diagnosis not present

## 2015-05-07 DIAGNOSIS — M1990 Unspecified osteoarthritis, unspecified site: Secondary | ICD-10-CM | POA: Diagnosis not present

## 2015-05-07 MED ORDER — HYDROCODONE-ACETAMINOPHEN 7.5-325 MG PO TABS: 1.0000 | ORAL_TABLET | Freq: Four times a day (QID) | ORAL | Status: DC | PRN

## 2015-05-07 NOTE — Telephone Encounter (Signed)
Pt want to know if hydrocodone ready for pick up.

## 2015-05-08 ENCOUNTER — Other Ambulatory Visit: Payer: Self-pay | Admitting: Internal Medicine

## 2015-05-08 DIAGNOSIS — G250 Essential tremor: Secondary | ICD-10-CM | POA: Diagnosis not present

## 2015-05-08 DIAGNOSIS — M1990 Unspecified osteoarthritis, unspecified site: Secondary | ICD-10-CM | POA: Diagnosis not present

## 2015-05-08 DIAGNOSIS — G252 Other specified forms of tremor: Secondary | ICD-10-CM | POA: Diagnosis not present

## 2015-05-08 NOTE — Telephone Encounter (Signed)
Pt informed

## 2015-05-08 NOTE — Telephone Encounter (Signed)
Pt want to know if Rx is ready. 

## 2015-05-09 DIAGNOSIS — G252 Other specified forms of tremor: Secondary | ICD-10-CM | POA: Diagnosis not present

## 2015-05-09 DIAGNOSIS — M1990 Unspecified osteoarthritis, unspecified site: Secondary | ICD-10-CM | POA: Diagnosis not present

## 2015-05-09 DIAGNOSIS — G250 Essential tremor: Secondary | ICD-10-CM | POA: Diagnosis not present

## 2015-05-10 DIAGNOSIS — G250 Essential tremor: Secondary | ICD-10-CM | POA: Diagnosis not present

## 2015-05-10 DIAGNOSIS — G252 Other specified forms of tremor: Secondary | ICD-10-CM | POA: Diagnosis not present

## 2015-05-10 DIAGNOSIS — M1990 Unspecified osteoarthritis, unspecified site: Secondary | ICD-10-CM | POA: Diagnosis not present

## 2015-05-11 DIAGNOSIS — G252 Other specified forms of tremor: Secondary | ICD-10-CM | POA: Diagnosis not present

## 2015-05-11 DIAGNOSIS — M1990 Unspecified osteoarthritis, unspecified site: Secondary | ICD-10-CM | POA: Diagnosis not present

## 2015-05-11 DIAGNOSIS — G250 Essential tremor: Secondary | ICD-10-CM | POA: Diagnosis not present

## 2015-05-14 DIAGNOSIS — M1990 Unspecified osteoarthritis, unspecified site: Secondary | ICD-10-CM | POA: Diagnosis not present

## 2015-05-14 DIAGNOSIS — G250 Essential tremor: Secondary | ICD-10-CM | POA: Diagnosis not present

## 2015-05-14 DIAGNOSIS — G252 Other specified forms of tremor: Secondary | ICD-10-CM | POA: Diagnosis not present

## 2015-05-15 DIAGNOSIS — G250 Essential tremor: Secondary | ICD-10-CM | POA: Diagnosis not present

## 2015-05-15 DIAGNOSIS — G252 Other specified forms of tremor: Secondary | ICD-10-CM | POA: Diagnosis not present

## 2015-05-15 DIAGNOSIS — M1990 Unspecified osteoarthritis, unspecified site: Secondary | ICD-10-CM | POA: Diagnosis not present

## 2015-05-16 ENCOUNTER — Encounter: Payer: Self-pay | Admitting: Internal Medicine

## 2015-05-16 ENCOUNTER — Ambulatory Visit (INDEPENDENT_AMBULATORY_CARE_PROVIDER_SITE_OTHER): Payer: Commercial Managed Care - HMO | Admitting: Internal Medicine

## 2015-05-16 VITALS — BP 154/67 | HR 75 | Temp 97.9°F | Ht 73.0 in | Wt 216.5 lb

## 2015-05-16 DIAGNOSIS — G250 Essential tremor: Secondary | ICD-10-CM | POA: Diagnosis not present

## 2015-05-16 DIAGNOSIS — J449 Chronic obstructive pulmonary disease, unspecified: Secondary | ICD-10-CM | POA: Diagnosis not present

## 2015-05-16 DIAGNOSIS — M1990 Unspecified osteoarthritis, unspecified site: Secondary | ICD-10-CM | POA: Diagnosis not present

## 2015-05-16 DIAGNOSIS — I700 Atherosclerosis of aorta: Secondary | ICD-10-CM

## 2015-05-16 DIAGNOSIS — Z0000 Encounter for general adult medical examination without abnormal findings: Secondary | ICD-10-CM

## 2015-05-16 DIAGNOSIS — E119 Type 2 diabetes mellitus without complications: Secondary | ICD-10-CM

## 2015-05-16 DIAGNOSIS — M170 Bilateral primary osteoarthritis of knee: Secondary | ICD-10-CM

## 2015-05-16 DIAGNOSIS — E785 Hyperlipidemia, unspecified: Secondary | ICD-10-CM

## 2015-05-16 DIAGNOSIS — I10 Essential (primary) hypertension: Secondary | ICD-10-CM

## 2015-05-16 DIAGNOSIS — G252 Other specified forms of tremor: Secondary | ICD-10-CM | POA: Diagnosis not present

## 2015-05-16 LAB — POCT GLYCOSYLATED HEMOGLOBIN (HGB A1C): HEMOGLOBIN A1C: 6.8

## 2015-05-16 LAB — GLUCOSE, CAPILLARY: GLUCOSE-CAPILLARY: 157 mg/dL — AB (ref 65–99)

## 2015-05-16 NOTE — Assessment & Plan Note (Signed)
Completely asymptomatic.  He denies chest pain, SOB, leg pain today.  Will continue to monitor.

## 2015-05-16 NOTE — Assessment & Plan Note (Signed)
He notes no significant symptoms today.  Continue propranolol

## 2015-05-16 NOTE — Assessment & Plan Note (Signed)
LDL checked this year at goal of < 100.  Continue pravastatin.

## 2015-05-16 NOTE — Assessment & Plan Note (Signed)
We discussed CT scanning for lung cancer today, will defer to 2017 at patient request.   PSA not indicated per patient.

## 2015-05-16 NOTE — Patient Instructions (Signed)
General Instructions: Please schedule a follow up visit within the next 2 months for your blood pressure.   For your medications:   Please bring all of your pill  Bottles with you to each visit.  This will help make sure that we have an up to date list of all the medications you are taking.  Please also bring any over the counter herbal medications you are taking (not including advil, tylenol, etc.)  Please continue taking all of your medications as prescribed.   Please check your blood pressure 3 times per week over the next 3 weeks and call me with the results.  You may need an increase in your blood pressure medications.   Thank you!   Treatment Goals:  Goals (1 Years of Data) as of 05/16/15          As of Today 12/06/14 07/06/14 02/01/14     Diet   . Have 3 meals a day          Lifestyle   . Eat Lunch        . Increase physical activity          Result Component   . HEMOGLOBIN A1C < 7.0  6.8 7.0 7.8 8.0      Progress Toward Treatment Goals:  Treatment Goal 12/06/2014  Hemoglobin A1C at goal  Blood pressure at goal    Self Care Goals & Plans:  Self Care Goal 05/16/2015  Manage my medications bring my medications to every visit; refill my medications on time; take my medicines as prescribed  Monitor my health keep track of my blood glucose; bring my glucose meter and log to each visit; keep track of my blood pressure; check my feet daily  Eat healthy foods eat more vegetables; eat foods that are low in salt; eat baked foods instead of fried foods; drink diet soda or water instead of juice or soda  Be physically active find an activity I enjoy    Home Blood Glucose Monitoring 03/23/2013  Check my blood sugar once a day  When to check my blood sugar N/A; before meals     Care Management & Community Referrals:  Referral 10/19/2013  Referrals made for care management support none needed
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
## 2015-05-16 NOTE — Assessment & Plan Note (Signed)
Very well controlled, not requiring oxygen.  He takes advair and spiriva daily and albuterol as needed.  No current issues.  Continue current medications.

## 2015-05-16 NOTE — Assessment & Plan Note (Signed)
Lab Results  Component Value Date   HGBA1C 6.8 05/16/2015   HGBA1C 7.0 12/06/2014   HGBA1C 7.8 07/06/2014     Assessment: Diabetes control: good control (HgbA1C at goal) Progress toward A1C goal:  at goal Comments: I think this is due to his weight loss and diet changes.   Plan: Medications:  continue current medications, metformin, glipizide Home glucose monitoring: Frequency:   Timing:   Instruction/counseling given: reminded to get eye exam, reminded to bring blood glucose meter & log to each visit and reminded to bring medications to each visit Educational resources provided:   Self management tools provided:   Other plans: MAU/Cr today.

## 2015-05-16 NOTE — Assessment & Plan Note (Signed)
Patient is on [place] and has a pain contract with the clinic.  No red flag behavior reported at this visit.   He will have a UDS done today.  Last dose of medication was yesterday.   Current regimen has him at 70 MME per day, which is < 50 MME per day recommended by CDC.

## 2015-05-16 NOTE — Assessment & Plan Note (Signed)
BP Readings from Last 3 Encounters:  05/16/15 154/67  02/27/15 147/72  02/14/15 155/76    Lab Results  Component Value Date   NA 140 12/06/2014   K 3.9 12/06/2014   CREATININE 1.00 12/06/2014    Assessment: Blood pressure control: mildly elevated Progress toward BP goal:  deteriorated Comments: He reports taking his medications as prescribed  Plan: Medications:  continue current medications, diliazem, lisinopril Educational resources provided:   Self management tools provided:   Other plans: I have asked him to check his blood pressure three X per week for the next 3 weeks and call me with the results.  If BP remains elevated at home, will increase lisinopril to 40mg  daily.

## 2015-05-17 LAB — MICROALBUMIN / CREATININE URINE RATIO
CREATININE, UR: 258.8 mg/dL
MICROALB/CREAT RATIO: 48.7 mg/g creat — ABNORMAL HIGH (ref 0.0–30.0)
MICROALBUM., U, RANDOM: 126.1 ug/mL

## 2015-05-18 DIAGNOSIS — M1990 Unspecified osteoarthritis, unspecified site: Secondary | ICD-10-CM | POA: Diagnosis not present

## 2015-05-18 DIAGNOSIS — G252 Other specified forms of tremor: Secondary | ICD-10-CM | POA: Diagnosis not present

## 2015-05-18 DIAGNOSIS — G250 Essential tremor: Secondary | ICD-10-CM | POA: Diagnosis not present

## 2015-05-21 DIAGNOSIS — M1990 Unspecified osteoarthritis, unspecified site: Secondary | ICD-10-CM | POA: Diagnosis not present

## 2015-05-21 DIAGNOSIS — G250 Essential tremor: Secondary | ICD-10-CM | POA: Diagnosis not present

## 2015-05-21 DIAGNOSIS — G252 Other specified forms of tremor: Secondary | ICD-10-CM | POA: Diagnosis not present

## 2015-05-22 DIAGNOSIS — G250 Essential tremor: Secondary | ICD-10-CM | POA: Diagnosis not present

## 2015-05-22 DIAGNOSIS — H40013 Open angle with borderline findings, low risk, bilateral: Secondary | ICD-10-CM | POA: Diagnosis not present

## 2015-05-22 DIAGNOSIS — H2513 Age-related nuclear cataract, bilateral: Secondary | ICD-10-CM | POA: Diagnosis not present

## 2015-05-22 DIAGNOSIS — H35033 Hypertensive retinopathy, bilateral: Secondary | ICD-10-CM | POA: Diagnosis not present

## 2015-05-22 DIAGNOSIS — G252 Other specified forms of tremor: Secondary | ICD-10-CM | POA: Diagnosis not present

## 2015-05-22 DIAGNOSIS — M1990 Unspecified osteoarthritis, unspecified site: Secondary | ICD-10-CM | POA: Diagnosis not present

## 2015-05-22 DIAGNOSIS — E119 Type 2 diabetes mellitus without complications: Secondary | ICD-10-CM | POA: Diagnosis not present

## 2015-05-22 DIAGNOSIS — H35039 Hypertensive retinopathy, unspecified eye: Secondary | ICD-10-CM | POA: Insufficient documentation

## 2015-05-22 DIAGNOSIS — H2510 Age-related nuclear cataract, unspecified eye: Secondary | ICD-10-CM | POA: Insufficient documentation

## 2015-05-23 DIAGNOSIS — G252 Other specified forms of tremor: Secondary | ICD-10-CM | POA: Diagnosis not present

## 2015-05-23 DIAGNOSIS — M1990 Unspecified osteoarthritis, unspecified site: Secondary | ICD-10-CM | POA: Diagnosis not present

## 2015-05-23 DIAGNOSIS — G250 Essential tremor: Secondary | ICD-10-CM | POA: Diagnosis not present

## 2015-05-24 DIAGNOSIS — M1990 Unspecified osteoarthritis, unspecified site: Secondary | ICD-10-CM | POA: Diagnosis not present

## 2015-05-24 DIAGNOSIS — G252 Other specified forms of tremor: Secondary | ICD-10-CM | POA: Diagnosis not present

## 2015-05-24 DIAGNOSIS — G250 Essential tremor: Secondary | ICD-10-CM | POA: Diagnosis not present

## 2015-05-25 DIAGNOSIS — M1990 Unspecified osteoarthritis, unspecified site: Secondary | ICD-10-CM | POA: Diagnosis not present

## 2015-05-25 DIAGNOSIS — G252 Other specified forms of tremor: Secondary | ICD-10-CM | POA: Diagnosis not present

## 2015-05-25 DIAGNOSIS — G250 Essential tremor: Secondary | ICD-10-CM | POA: Diagnosis not present

## 2015-05-26 LAB — TOXASSURE SELECT,+ANTIDEPR,UR: PDF: 0

## 2015-05-28 DIAGNOSIS — M1990 Unspecified osteoarthritis, unspecified site: Secondary | ICD-10-CM | POA: Diagnosis not present

## 2015-05-28 DIAGNOSIS — G252 Other specified forms of tremor: Secondary | ICD-10-CM | POA: Diagnosis not present

## 2015-05-28 DIAGNOSIS — G250 Essential tremor: Secondary | ICD-10-CM | POA: Diagnosis not present

## 2015-05-29 DIAGNOSIS — G252 Other specified forms of tremor: Secondary | ICD-10-CM | POA: Diagnosis not present

## 2015-05-29 DIAGNOSIS — G250 Essential tremor: Secondary | ICD-10-CM | POA: Diagnosis not present

## 2015-05-29 DIAGNOSIS — M1990 Unspecified osteoarthritis, unspecified site: Secondary | ICD-10-CM | POA: Diagnosis not present

## 2015-05-30 DIAGNOSIS — G250 Essential tremor: Secondary | ICD-10-CM | POA: Diagnosis not present

## 2015-05-30 DIAGNOSIS — M1990 Unspecified osteoarthritis, unspecified site: Secondary | ICD-10-CM | POA: Diagnosis not present

## 2015-05-30 DIAGNOSIS — G252 Other specified forms of tremor: Secondary | ICD-10-CM | POA: Diagnosis not present

## 2015-05-31 DIAGNOSIS — G250 Essential tremor: Secondary | ICD-10-CM | POA: Diagnosis not present

## 2015-05-31 DIAGNOSIS — G252 Other specified forms of tremor: Secondary | ICD-10-CM | POA: Diagnosis not present

## 2015-05-31 DIAGNOSIS — M1990 Unspecified osteoarthritis, unspecified site: Secondary | ICD-10-CM | POA: Diagnosis not present

## 2015-06-01 DIAGNOSIS — G250 Essential tremor: Secondary | ICD-10-CM | POA: Diagnosis not present

## 2015-06-01 DIAGNOSIS — M1990 Unspecified osteoarthritis, unspecified site: Secondary | ICD-10-CM | POA: Diagnosis not present

## 2015-06-01 DIAGNOSIS — G252 Other specified forms of tremor: Secondary | ICD-10-CM | POA: Diagnosis not present

## 2015-06-04 DIAGNOSIS — M1990 Unspecified osteoarthritis, unspecified site: Secondary | ICD-10-CM | POA: Diagnosis not present

## 2015-06-04 DIAGNOSIS — G250 Essential tremor: Secondary | ICD-10-CM | POA: Diagnosis not present

## 2015-06-04 DIAGNOSIS — G252 Other specified forms of tremor: Secondary | ICD-10-CM | POA: Diagnosis not present

## 2015-06-05 DIAGNOSIS — G250 Essential tremor: Secondary | ICD-10-CM | POA: Diagnosis not present

## 2015-06-05 DIAGNOSIS — G252 Other specified forms of tremor: Secondary | ICD-10-CM | POA: Diagnosis not present

## 2015-06-05 DIAGNOSIS — M1990 Unspecified osteoarthritis, unspecified site: Secondary | ICD-10-CM | POA: Diagnosis not present

## 2015-06-06 ENCOUNTER — Other Ambulatory Visit: Payer: Self-pay | Admitting: Internal Medicine

## 2015-06-06 DIAGNOSIS — G252 Other specified forms of tremor: Secondary | ICD-10-CM | POA: Diagnosis not present

## 2015-06-06 DIAGNOSIS — M170 Bilateral primary osteoarthritis of knee: Secondary | ICD-10-CM

## 2015-06-06 DIAGNOSIS — M1990 Unspecified osteoarthritis, unspecified site: Secondary | ICD-10-CM | POA: Diagnosis not present

## 2015-06-06 DIAGNOSIS — G250 Essential tremor: Secondary | ICD-10-CM | POA: Diagnosis not present

## 2015-06-06 NOTE — Telephone Encounter (Signed)
Last filled 11/14 Pt is to be seen in [place] but no appt scheduled Last uds 11/23

## 2015-06-06 NOTE — Telephone Encounter (Signed)
Pt requesting hydrocodone to be filled.  °

## 2015-06-07 DIAGNOSIS — M1990 Unspecified osteoarthritis, unspecified site: Secondary | ICD-10-CM | POA: Diagnosis not present

## 2015-06-07 DIAGNOSIS — G252 Other specified forms of tremor: Secondary | ICD-10-CM | POA: Diagnosis not present

## 2015-06-07 DIAGNOSIS — G250 Essential tremor: Secondary | ICD-10-CM | POA: Diagnosis not present

## 2015-06-07 MED ORDER — HYDROCODONE-ACETAMINOPHEN 7.5-325 MG PO TABS: 1.0000 | ORAL_TABLET | Freq: Four times a day (QID) | ORAL | Status: DC | PRN

## 2015-06-07 NOTE — Telephone Encounter (Signed)
Pt aware Rx is ready. 

## 2015-06-08 DIAGNOSIS — M1990 Unspecified osteoarthritis, unspecified site: Secondary | ICD-10-CM | POA: Diagnosis not present

## 2015-06-08 DIAGNOSIS — G250 Essential tremor: Secondary | ICD-10-CM | POA: Diagnosis not present

## 2015-06-08 DIAGNOSIS — G252 Other specified forms of tremor: Secondary | ICD-10-CM | POA: Diagnosis not present

## 2015-06-11 DIAGNOSIS — G250 Essential tremor: Secondary | ICD-10-CM | POA: Diagnosis not present

## 2015-06-11 DIAGNOSIS — G252 Other specified forms of tremor: Secondary | ICD-10-CM | POA: Diagnosis not present

## 2015-06-11 DIAGNOSIS — M1990 Unspecified osteoarthritis, unspecified site: Secondary | ICD-10-CM | POA: Diagnosis not present

## 2015-06-12 DIAGNOSIS — G250 Essential tremor: Secondary | ICD-10-CM | POA: Diagnosis not present

## 2015-06-12 DIAGNOSIS — M1990 Unspecified osteoarthritis, unspecified site: Secondary | ICD-10-CM | POA: Diagnosis not present

## 2015-06-12 DIAGNOSIS — G252 Other specified forms of tremor: Secondary | ICD-10-CM | POA: Diagnosis not present

## 2015-06-13 DIAGNOSIS — M1990 Unspecified osteoarthritis, unspecified site: Secondary | ICD-10-CM | POA: Diagnosis not present

## 2015-06-13 DIAGNOSIS — G252 Other specified forms of tremor: Secondary | ICD-10-CM | POA: Diagnosis not present

## 2015-06-13 DIAGNOSIS — G250 Essential tremor: Secondary | ICD-10-CM | POA: Diagnosis not present

## 2015-06-14 DIAGNOSIS — M1990 Unspecified osteoarthritis, unspecified site: Secondary | ICD-10-CM | POA: Diagnosis not present

## 2015-06-14 DIAGNOSIS — G252 Other specified forms of tremor: Secondary | ICD-10-CM | POA: Diagnosis not present

## 2015-06-14 DIAGNOSIS — G250 Essential tremor: Secondary | ICD-10-CM | POA: Diagnosis not present

## 2015-06-15 DIAGNOSIS — G250 Essential tremor: Secondary | ICD-10-CM | POA: Diagnosis not present

## 2015-06-15 DIAGNOSIS — G252 Other specified forms of tremor: Secondary | ICD-10-CM | POA: Diagnosis not present

## 2015-06-15 DIAGNOSIS — M1990 Unspecified osteoarthritis, unspecified site: Secondary | ICD-10-CM | POA: Diagnosis not present

## 2015-06-18 DIAGNOSIS — G250 Essential tremor: Secondary | ICD-10-CM | POA: Diagnosis not present

## 2015-06-18 DIAGNOSIS — M1990 Unspecified osteoarthritis, unspecified site: Secondary | ICD-10-CM | POA: Diagnosis not present

## 2015-06-18 DIAGNOSIS — G252 Other specified forms of tremor: Secondary | ICD-10-CM | POA: Diagnosis not present

## 2015-06-19 DIAGNOSIS — G250 Essential tremor: Secondary | ICD-10-CM | POA: Diagnosis not present

## 2015-06-19 DIAGNOSIS — G252 Other specified forms of tremor: Secondary | ICD-10-CM | POA: Diagnosis not present

## 2015-06-19 DIAGNOSIS — M1990 Unspecified osteoarthritis, unspecified site: Secondary | ICD-10-CM | POA: Diagnosis not present

## 2015-06-20 DIAGNOSIS — M1990 Unspecified osteoarthritis, unspecified site: Secondary | ICD-10-CM | POA: Diagnosis not present

## 2015-06-20 DIAGNOSIS — G252 Other specified forms of tremor: Secondary | ICD-10-CM | POA: Diagnosis not present

## 2015-06-20 DIAGNOSIS — G250 Essential tremor: Secondary | ICD-10-CM | POA: Diagnosis not present

## 2015-06-21 DIAGNOSIS — G250 Essential tremor: Secondary | ICD-10-CM | POA: Diagnosis not present

## 2015-06-21 DIAGNOSIS — G252 Other specified forms of tremor: Secondary | ICD-10-CM | POA: Diagnosis not present

## 2015-06-21 DIAGNOSIS — M1990 Unspecified osteoarthritis, unspecified site: Secondary | ICD-10-CM | POA: Diagnosis not present

## 2015-06-22 DIAGNOSIS — G250 Essential tremor: Secondary | ICD-10-CM | POA: Diagnosis not present

## 2015-06-22 DIAGNOSIS — G252 Other specified forms of tremor: Secondary | ICD-10-CM | POA: Diagnosis not present

## 2015-06-22 DIAGNOSIS — M1990 Unspecified osteoarthritis, unspecified site: Secondary | ICD-10-CM | POA: Diagnosis not present

## 2015-06-25 DIAGNOSIS — G250 Essential tremor: Secondary | ICD-10-CM | POA: Diagnosis not present

## 2015-06-25 DIAGNOSIS — G252 Other specified forms of tremor: Secondary | ICD-10-CM | POA: Diagnosis not present

## 2015-06-25 DIAGNOSIS — M1990 Unspecified osteoarthritis, unspecified site: Secondary | ICD-10-CM | POA: Diagnosis not present

## 2015-06-26 DIAGNOSIS — G250 Essential tremor: Secondary | ICD-10-CM | POA: Diagnosis not present

## 2015-06-26 DIAGNOSIS — M1990 Unspecified osteoarthritis, unspecified site: Secondary | ICD-10-CM | POA: Diagnosis not present

## 2015-06-26 DIAGNOSIS — G252 Other specified forms of tremor: Secondary | ICD-10-CM | POA: Diagnosis not present

## 2015-06-27 DIAGNOSIS — M1990 Unspecified osteoarthritis, unspecified site: Secondary | ICD-10-CM | POA: Diagnosis not present

## 2015-06-27 DIAGNOSIS — G252 Other specified forms of tremor: Secondary | ICD-10-CM | POA: Diagnosis not present

## 2015-06-27 DIAGNOSIS — G250 Essential tremor: Secondary | ICD-10-CM | POA: Diagnosis not present

## 2015-06-28 ENCOUNTER — Other Ambulatory Visit: Payer: Self-pay | Admitting: Internal Medicine

## 2015-06-28 DIAGNOSIS — G250 Essential tremor: Secondary | ICD-10-CM | POA: Diagnosis not present

## 2015-06-28 DIAGNOSIS — G252 Other specified forms of tremor: Secondary | ICD-10-CM | POA: Diagnosis not present

## 2015-06-28 DIAGNOSIS — M1990 Unspecified osteoarthritis, unspecified site: Secondary | ICD-10-CM | POA: Diagnosis not present

## 2015-06-29 DIAGNOSIS — G252 Other specified forms of tremor: Secondary | ICD-10-CM | POA: Diagnosis not present

## 2015-06-29 DIAGNOSIS — M1990 Unspecified osteoarthritis, unspecified site: Secondary | ICD-10-CM | POA: Diagnosis not present

## 2015-06-29 DIAGNOSIS — G250 Essential tremor: Secondary | ICD-10-CM | POA: Diagnosis not present

## 2015-07-02 DIAGNOSIS — G250 Essential tremor: Secondary | ICD-10-CM | POA: Diagnosis not present

## 2015-07-02 DIAGNOSIS — M1990 Unspecified osteoarthritis, unspecified site: Secondary | ICD-10-CM | POA: Diagnosis not present

## 2015-07-02 DIAGNOSIS — G252 Other specified forms of tremor: Secondary | ICD-10-CM | POA: Diagnosis not present

## 2015-07-03 ENCOUNTER — Other Ambulatory Visit: Payer: Self-pay | Admitting: Internal Medicine

## 2015-07-03 DIAGNOSIS — G252 Other specified forms of tremor: Secondary | ICD-10-CM | POA: Diagnosis not present

## 2015-07-03 DIAGNOSIS — G250 Essential tremor: Secondary | ICD-10-CM | POA: Diagnosis not present

## 2015-07-03 DIAGNOSIS — M1990 Unspecified osteoarthritis, unspecified site: Secondary | ICD-10-CM | POA: Diagnosis not present

## 2015-07-04 DIAGNOSIS — G252 Other specified forms of tremor: Secondary | ICD-10-CM | POA: Diagnosis not present

## 2015-07-04 DIAGNOSIS — G250 Essential tremor: Secondary | ICD-10-CM | POA: Diagnosis not present

## 2015-07-04 DIAGNOSIS — M1990 Unspecified osteoarthritis, unspecified site: Secondary | ICD-10-CM | POA: Diagnosis not present

## 2015-07-05 DIAGNOSIS — M1990 Unspecified osteoarthritis, unspecified site: Secondary | ICD-10-CM | POA: Diagnosis not present

## 2015-07-05 DIAGNOSIS — G252 Other specified forms of tremor: Secondary | ICD-10-CM | POA: Diagnosis not present

## 2015-07-05 DIAGNOSIS — G250 Essential tremor: Secondary | ICD-10-CM | POA: Diagnosis not present

## 2015-07-05 NOTE — Telephone Encounter (Signed)
Patient is asking for pain medication refill

## 2015-07-06 DIAGNOSIS — M1990 Unspecified osteoarthritis, unspecified site: Secondary | ICD-10-CM | POA: Diagnosis not present

## 2015-07-06 DIAGNOSIS — G250 Essential tremor: Secondary | ICD-10-CM | POA: Diagnosis not present

## 2015-07-06 DIAGNOSIS — G252 Other specified forms of tremor: Secondary | ICD-10-CM | POA: Diagnosis not present

## 2015-07-10 ENCOUNTER — Other Ambulatory Visit: Payer: Self-pay | Admitting: *Deleted

## 2015-07-10 DIAGNOSIS — G252 Other specified forms of tremor: Secondary | ICD-10-CM | POA: Diagnosis not present

## 2015-07-10 DIAGNOSIS — M1990 Unspecified osteoarthritis, unspecified site: Secondary | ICD-10-CM | POA: Diagnosis not present

## 2015-07-10 DIAGNOSIS — M170 Bilateral primary osteoarthritis of knee: Secondary | ICD-10-CM

## 2015-07-10 DIAGNOSIS — G250 Essential tremor: Secondary | ICD-10-CM | POA: Diagnosis not present

## 2015-07-10 NOTE — Telephone Encounter (Signed)
Last 12/15 Next appt, none scheduled, will schedule when call pt for pick up

## 2015-07-10 NOTE — Telephone Encounter (Signed)
Will not be able to fill until tomorrow 07/11/15.

## 2015-07-11 ENCOUNTER — Telehealth: Payer: Self-pay | Admitting: Internal Medicine

## 2015-07-11 DIAGNOSIS — G250 Essential tremor: Secondary | ICD-10-CM | POA: Diagnosis not present

## 2015-07-11 DIAGNOSIS — M1990 Unspecified osteoarthritis, unspecified site: Secondary | ICD-10-CM | POA: Diagnosis not present

## 2015-07-11 DIAGNOSIS — G252 Other specified forms of tremor: Secondary | ICD-10-CM | POA: Diagnosis not present

## 2015-07-11 NOTE — Telephone Encounter (Signed)
Pt want to know if hydrocodone is ready.

## 2015-07-12 DIAGNOSIS — G250 Essential tremor: Secondary | ICD-10-CM | POA: Diagnosis not present

## 2015-07-12 DIAGNOSIS — G252 Other specified forms of tremor: Secondary | ICD-10-CM | POA: Diagnosis not present

## 2015-07-12 DIAGNOSIS — M1990 Unspecified osteoarthritis, unspecified site: Secondary | ICD-10-CM | POA: Diagnosis not present

## 2015-07-12 MED ORDER — HYDROCODONE-ACETAMINOPHEN 7.5-325 MG PO TABS: 1.0000 | ORAL_TABLET | Freq: Four times a day (QID) | ORAL | Status: DC | PRN

## 2015-07-13 DIAGNOSIS — G252 Other specified forms of tremor: Secondary | ICD-10-CM | POA: Diagnosis not present

## 2015-07-13 DIAGNOSIS — M1990 Unspecified osteoarthritis, unspecified site: Secondary | ICD-10-CM | POA: Diagnosis not present

## 2015-07-13 DIAGNOSIS — G250 Essential tremor: Secondary | ICD-10-CM | POA: Diagnosis not present

## 2015-07-16 DIAGNOSIS — G250 Essential tremor: Secondary | ICD-10-CM | POA: Diagnosis not present

## 2015-07-16 DIAGNOSIS — M1990 Unspecified osteoarthritis, unspecified site: Secondary | ICD-10-CM | POA: Diagnosis not present

## 2015-07-16 DIAGNOSIS — G252 Other specified forms of tremor: Secondary | ICD-10-CM | POA: Diagnosis not present

## 2015-07-17 ENCOUNTER — Other Ambulatory Visit: Payer: Self-pay | Admitting: Internal Medicine

## 2015-07-17 DIAGNOSIS — G250 Essential tremor: Secondary | ICD-10-CM | POA: Diagnosis not present

## 2015-07-17 DIAGNOSIS — M1990 Unspecified osteoarthritis, unspecified site: Secondary | ICD-10-CM | POA: Diagnosis not present

## 2015-07-17 DIAGNOSIS — G252 Other specified forms of tremor: Secondary | ICD-10-CM | POA: Diagnosis not present

## 2015-07-18 DIAGNOSIS — M1990 Unspecified osteoarthritis, unspecified site: Secondary | ICD-10-CM | POA: Diagnosis not present

## 2015-07-18 DIAGNOSIS — G250 Essential tremor: Secondary | ICD-10-CM | POA: Diagnosis not present

## 2015-07-18 DIAGNOSIS — G252 Other specified forms of tremor: Secondary | ICD-10-CM | POA: Diagnosis not present

## 2015-07-19 DIAGNOSIS — M1990 Unspecified osteoarthritis, unspecified site: Secondary | ICD-10-CM | POA: Diagnosis not present

## 2015-07-19 DIAGNOSIS — G250 Essential tremor: Secondary | ICD-10-CM | POA: Diagnosis not present

## 2015-07-19 DIAGNOSIS — G252 Other specified forms of tremor: Secondary | ICD-10-CM | POA: Diagnosis not present

## 2015-07-20 DIAGNOSIS — G252 Other specified forms of tremor: Secondary | ICD-10-CM | POA: Diagnosis not present

## 2015-07-20 DIAGNOSIS — M1990 Unspecified osteoarthritis, unspecified site: Secondary | ICD-10-CM | POA: Diagnosis not present

## 2015-07-20 DIAGNOSIS — G250 Essential tremor: Secondary | ICD-10-CM | POA: Diagnosis not present

## 2015-07-23 DIAGNOSIS — G250 Essential tremor: Secondary | ICD-10-CM | POA: Diagnosis not present

## 2015-07-23 DIAGNOSIS — G252 Other specified forms of tremor: Secondary | ICD-10-CM | POA: Diagnosis not present

## 2015-07-23 DIAGNOSIS — M1990 Unspecified osteoarthritis, unspecified site: Secondary | ICD-10-CM | POA: Diagnosis not present

## 2015-07-24 DIAGNOSIS — G252 Other specified forms of tremor: Secondary | ICD-10-CM | POA: Diagnosis not present

## 2015-07-24 DIAGNOSIS — G250 Essential tremor: Secondary | ICD-10-CM | POA: Diagnosis not present

## 2015-07-24 DIAGNOSIS — M1990 Unspecified osteoarthritis, unspecified site: Secondary | ICD-10-CM | POA: Diagnosis not present

## 2015-07-25 DIAGNOSIS — G250 Essential tremor: Secondary | ICD-10-CM | POA: Diagnosis not present

## 2015-07-25 DIAGNOSIS — G252 Other specified forms of tremor: Secondary | ICD-10-CM | POA: Diagnosis not present

## 2015-07-25 DIAGNOSIS — M1990 Unspecified osteoarthritis, unspecified site: Secondary | ICD-10-CM | POA: Diagnosis not present

## 2015-07-26 DIAGNOSIS — M1990 Unspecified osteoarthritis, unspecified site: Secondary | ICD-10-CM | POA: Diagnosis not present

## 2015-07-26 DIAGNOSIS — G250 Essential tremor: Secondary | ICD-10-CM | POA: Diagnosis not present

## 2015-07-26 DIAGNOSIS — G252 Other specified forms of tremor: Secondary | ICD-10-CM | POA: Diagnosis not present

## 2015-07-27 DIAGNOSIS — G252 Other specified forms of tremor: Secondary | ICD-10-CM | POA: Diagnosis not present

## 2015-07-27 DIAGNOSIS — M1990 Unspecified osteoarthritis, unspecified site: Secondary | ICD-10-CM | POA: Diagnosis not present

## 2015-07-27 DIAGNOSIS — G250 Essential tremor: Secondary | ICD-10-CM | POA: Diagnosis not present

## 2015-07-30 DIAGNOSIS — G250 Essential tremor: Secondary | ICD-10-CM | POA: Diagnosis not present

## 2015-07-30 DIAGNOSIS — M1990 Unspecified osteoarthritis, unspecified site: Secondary | ICD-10-CM | POA: Diagnosis not present

## 2015-07-30 DIAGNOSIS — G252 Other specified forms of tremor: Secondary | ICD-10-CM | POA: Diagnosis not present

## 2015-07-31 DIAGNOSIS — M1990 Unspecified osteoarthritis, unspecified site: Secondary | ICD-10-CM | POA: Diagnosis not present

## 2015-07-31 DIAGNOSIS — G250 Essential tremor: Secondary | ICD-10-CM | POA: Diagnosis not present

## 2015-07-31 DIAGNOSIS — G252 Other specified forms of tremor: Secondary | ICD-10-CM | POA: Diagnosis not present

## 2015-08-01 DIAGNOSIS — G252 Other specified forms of tremor: Secondary | ICD-10-CM | POA: Diagnosis not present

## 2015-08-01 DIAGNOSIS — G250 Essential tremor: Secondary | ICD-10-CM | POA: Diagnosis not present

## 2015-08-01 DIAGNOSIS — M1990 Unspecified osteoarthritis, unspecified site: Secondary | ICD-10-CM | POA: Diagnosis not present

## 2015-08-02 DIAGNOSIS — G252 Other specified forms of tremor: Secondary | ICD-10-CM | POA: Diagnosis not present

## 2015-08-02 DIAGNOSIS — M1990 Unspecified osteoarthritis, unspecified site: Secondary | ICD-10-CM | POA: Diagnosis not present

## 2015-08-02 DIAGNOSIS — G250 Essential tremor: Secondary | ICD-10-CM | POA: Diagnosis not present

## 2015-08-03 DIAGNOSIS — G250 Essential tremor: Secondary | ICD-10-CM | POA: Diagnosis not present

## 2015-08-03 DIAGNOSIS — G252 Other specified forms of tremor: Secondary | ICD-10-CM | POA: Diagnosis not present

## 2015-08-03 DIAGNOSIS — M1990 Unspecified osteoarthritis, unspecified site: Secondary | ICD-10-CM | POA: Diagnosis not present

## 2015-08-06 ENCOUNTER — Other Ambulatory Visit: Payer: Self-pay | Admitting: Internal Medicine

## 2015-08-06 DIAGNOSIS — G252 Other specified forms of tremor: Secondary | ICD-10-CM | POA: Diagnosis not present

## 2015-08-06 DIAGNOSIS — G250 Essential tremor: Secondary | ICD-10-CM | POA: Diagnosis not present

## 2015-08-06 DIAGNOSIS — M170 Bilateral primary osteoarthritis of knee: Secondary | ICD-10-CM

## 2015-08-06 DIAGNOSIS — M1990 Unspecified osteoarthritis, unspecified site: Secondary | ICD-10-CM | POA: Diagnosis not present

## 2015-08-06 MED ORDER — HYDROCODONE-ACETAMINOPHEN 7.5-325 MG PO TABS: 1.0000 | ORAL_TABLET | Freq: Four times a day (QID) | ORAL | Status: DC | PRN

## 2015-08-06 NOTE — Telephone Encounter (Signed)
Patient needs refill pain medication

## 2015-08-06 NOTE — Telephone Encounter (Addendum)
Last refill 1/19 Last UDS - 11/23 Appointment scheduled 2/22

## 2015-08-07 DIAGNOSIS — M1990 Unspecified osteoarthritis, unspecified site: Secondary | ICD-10-CM | POA: Diagnosis not present

## 2015-08-07 DIAGNOSIS — G250 Essential tremor: Secondary | ICD-10-CM | POA: Diagnosis not present

## 2015-08-07 DIAGNOSIS — G252 Other specified forms of tremor: Secondary | ICD-10-CM | POA: Diagnosis not present

## 2015-08-08 DIAGNOSIS — G250 Essential tremor: Secondary | ICD-10-CM | POA: Diagnosis not present

## 2015-08-08 DIAGNOSIS — G252 Other specified forms of tremor: Secondary | ICD-10-CM | POA: Diagnosis not present

## 2015-08-08 DIAGNOSIS — M1990 Unspecified osteoarthritis, unspecified site: Secondary | ICD-10-CM | POA: Diagnosis not present

## 2015-08-09 DIAGNOSIS — M1990 Unspecified osteoarthritis, unspecified site: Secondary | ICD-10-CM | POA: Diagnosis not present

## 2015-08-09 DIAGNOSIS — G250 Essential tremor: Secondary | ICD-10-CM | POA: Diagnosis not present

## 2015-08-09 DIAGNOSIS — G252 Other specified forms of tremor: Secondary | ICD-10-CM | POA: Diagnosis not present

## 2015-08-10 DIAGNOSIS — G250 Essential tremor: Secondary | ICD-10-CM | POA: Diagnosis not present

## 2015-08-10 DIAGNOSIS — G252 Other specified forms of tremor: Secondary | ICD-10-CM | POA: Diagnosis not present

## 2015-08-10 DIAGNOSIS — M1990 Unspecified osteoarthritis, unspecified site: Secondary | ICD-10-CM | POA: Diagnosis not present

## 2015-08-13 DIAGNOSIS — M1990 Unspecified osteoarthritis, unspecified site: Secondary | ICD-10-CM | POA: Diagnosis not present

## 2015-08-13 DIAGNOSIS — G252 Other specified forms of tremor: Secondary | ICD-10-CM | POA: Diagnosis not present

## 2015-08-13 DIAGNOSIS — G250 Essential tremor: Secondary | ICD-10-CM | POA: Diagnosis not present

## 2015-08-14 DIAGNOSIS — G250 Essential tremor: Secondary | ICD-10-CM | POA: Diagnosis not present

## 2015-08-14 DIAGNOSIS — G252 Other specified forms of tremor: Secondary | ICD-10-CM | POA: Diagnosis not present

## 2015-08-14 DIAGNOSIS — M1990 Unspecified osteoarthritis, unspecified site: Secondary | ICD-10-CM | POA: Diagnosis not present

## 2015-08-15 ENCOUNTER — Ambulatory Visit: Payer: Commercial Managed Care - HMO | Admitting: Internal Medicine

## 2015-08-15 DIAGNOSIS — G250 Essential tremor: Secondary | ICD-10-CM | POA: Diagnosis not present

## 2015-08-15 DIAGNOSIS — G252 Other specified forms of tremor: Secondary | ICD-10-CM | POA: Diagnosis not present

## 2015-08-15 DIAGNOSIS — M1990 Unspecified osteoarthritis, unspecified site: Secondary | ICD-10-CM | POA: Diagnosis not present

## 2015-08-16 ENCOUNTER — Telehealth: Payer: Self-pay | Admitting: Licensed Clinical Social Worker

## 2015-08-16 DIAGNOSIS — M1990 Unspecified osteoarthritis, unspecified site: Secondary | ICD-10-CM | POA: Diagnosis not present

## 2015-08-16 DIAGNOSIS — G250 Essential tremor: Secondary | ICD-10-CM | POA: Diagnosis not present

## 2015-08-16 DIAGNOSIS — G252 Other specified forms of tremor: Secondary | ICD-10-CM | POA: Diagnosis not present

## 2015-08-16 NOTE — Telephone Encounter (Signed)
[facility] received request for Medical Status Change on behalf of [name].  Pt canceled recent appointment, message left stating pt will need to be seen prior to any forms being signed as pt appointment has been over 90 days and will need to address the change of medical status.  Pt is scheduled for 08/22/15.

## 2015-08-16 NOTE — Telephone Encounter (Signed)
Thanks

## 2015-08-17 DIAGNOSIS — M1990 Unspecified osteoarthritis, unspecified site: Secondary | ICD-10-CM | POA: Diagnosis not present

## 2015-08-17 DIAGNOSIS — G250 Essential tremor: Secondary | ICD-10-CM | POA: Diagnosis not present

## 2015-08-17 DIAGNOSIS — G252 Other specified forms of tremor: Secondary | ICD-10-CM | POA: Diagnosis not present

## 2015-08-22 ENCOUNTER — Encounter: Payer: Self-pay | Admitting: Internal Medicine

## 2015-08-22 ENCOUNTER — Ambulatory Visit (INDEPENDENT_AMBULATORY_CARE_PROVIDER_SITE_OTHER): Payer: Commercial Managed Care - HMO | Admitting: Internal Medicine

## 2015-08-22 VITALS — BP 138/80 | HR 68 | Temp 97.4°F | Ht 73.0 in | Wt 203.3 lb

## 2015-08-22 DIAGNOSIS — I10 Essential (primary) hypertension: Secondary | ICD-10-CM | POA: Diagnosis not present

## 2015-08-22 DIAGNOSIS — E119 Type 2 diabetes mellitus without complications: Secondary | ICD-10-CM | POA: Diagnosis not present

## 2015-08-22 DIAGNOSIS — G8929 Other chronic pain: Secondary | ICD-10-CM

## 2015-08-22 DIAGNOSIS — Z7984 Long term (current) use of oral hypoglycemic drugs: Secondary | ICD-10-CM

## 2015-08-22 DIAGNOSIS — M1710 Unilateral primary osteoarthritis, unspecified knee: Secondary | ICD-10-CM

## 2015-08-22 DIAGNOSIS — Z7951 Long term (current) use of inhaled steroids: Secondary | ICD-10-CM

## 2015-08-22 DIAGNOSIS — E785 Hyperlipidemia, unspecified: Secondary | ICD-10-CM | POA: Diagnosis not present

## 2015-08-22 DIAGNOSIS — I700 Atherosclerosis of aorta: Secondary | ICD-10-CM

## 2015-08-22 DIAGNOSIS — Z0000 Encounter for general adult medical examination without abnormal findings: Secondary | ICD-10-CM

## 2015-08-22 DIAGNOSIS — Z7982 Long term (current) use of aspirin: Secondary | ICD-10-CM

## 2015-08-22 DIAGNOSIS — J449 Chronic obstructive pulmonary disease, unspecified: Secondary | ICD-10-CM

## 2015-08-22 DIAGNOSIS — G250 Essential tremor: Secondary | ICD-10-CM

## 2015-08-22 DIAGNOSIS — M5417 Radiculopathy, lumbosacral region: Secondary | ICD-10-CM

## 2015-08-22 DIAGNOSIS — Z79899 Other long term (current) drug therapy: Secondary | ICD-10-CM

## 2015-08-22 DIAGNOSIS — H35033 Hypertensive retinopathy, bilateral: Secondary | ICD-10-CM

## 2015-08-22 LAB — GLUCOSE, CAPILLARY: GLUCOSE-CAPILLARY: 192 mg/dL — AB (ref 65–99)

## 2015-08-22 LAB — POCT GLYCOSYLATED HEMOGLOBIN (HGB A1C): HEMOGLOBIN A1C: 6.7

## 2015-08-22 MED ORDER — ACCU-CHEK AVIVA PLUS W/DEVICE KIT: 1.0000 | PACK | Freq: Every day | Status: DC

## 2015-08-22 NOTE — Assessment & Plan Note (Signed)
Well controlled today.  He did not have a flare with recent illness.  He reports continued abstinence from smoking.   Continue advair, spiriva, albuterol nebs PRN

## 2015-08-22 NOTE — Assessment & Plan Note (Signed)
He is on aspirin.  He has no complaints of chest pain, abdominal pain or symptoms at this time.  Monitor for changes in status.

## 2015-08-22 NOTE — Assessment & Plan Note (Signed)
At last check, well controlled at 94.  Continue pravastatin.

## 2015-08-22 NOTE — Assessment & Plan Note (Signed)
His diabetes is well controlled.  He reports no history of retinopathy and has no nephropathy.  He was previously on insulin but is now well controlled with medications.   A1C 6.7 BP well controlled LDL 75 at last check, he is on pravastatin MAU/Cr - mildly elevated at 48, renal function stable by creatinine Eye: due by HM, he is seeing an eye doctor, will need to get results Foot: No sensory changes, no history of neuropathy.   Continue current medications of metformin and glipizide.  Reorder meter, check daily as needed.  I congratulated him on his excellent control.

## 2015-08-22 NOTE — Progress Notes (Signed)
   Subjective:    Patient ID: [name], male    DOB: 1951-02-01, 65 y.o.   MRN: [id number]  CC: 4 month follow up for DM2  HPI  Mr. Caraker is a 65yo man with PMH of COPD, DM2, HTN, HLD, ET, OA who presents for routine follow up.   Mr. Gabel reports that he just recovered from a sinus infection and is feeling much better.  He was treated with OTC medications and soup from his sister.  He has no major complaints about this today.  He has knee OA for which he has been treated with narcotics for a long period of time.  [place] narcotic database is appropriate.  He is able to walk, see his family, usher in church, etc due to the pain medications.  If he did not have them he would not be able to be as active as he has been.    His BP was elevated when he came in because he was rushing to get here on time.  His recheck was 138/80.    He did not bring in his medications.  He is not smoking.  He gets a lot of enjoyment out of life due to his church involvement.    Review of Systems  Constitutional: Negative for activity change and appetite change.  HENT: Positive for congestion (improving). Negative for tinnitus.   Eyes: Negative for photophobia and visual disturbance.  Respiratory: Negative for cough, shortness of breath and stridor.   Cardiovascular: Negative for chest pain and leg swelling.  Gastrointestinal: Negative for nausea, abdominal pain and diarrhea.  Musculoskeletal: Positive for arthralgias. Negative for back pain and gait problem.  Neurological: Negative for dizziness and weakness.       Objective:   Physical Exam  Constitutional: He is oriented to person, place, and time. He appears well-developed and well-nourished. No distress.  HENT:  Head: Normocephalic and atraumatic.  Red/boggy turbinates in the nares  Eyes: Conjunctivae are normal. No scleral icterus.  Cardiovascular: Normal rate, regular rhythm and normal heart sounds.   Pulmonary/Chest: Effort normal  and breath sounds normal. No respiratory distress. He has no wheezes.  Abdominal: Soft. Bowel sounds are normal.  Neurological: He is alert and oriented to person, place, and time.  Psychiatric: He has a normal mood and affect. His behavior is normal.    A1C, CMET, HCV screen today      Assessment & Plan:  RTC in 4 months, sooner if needed

## 2015-08-22 NOTE — Assessment & Plan Note (Signed)
This is by report from previous ophthalmology notes in the [facility] system.  Will get report from local eye doctor.   Good BP control was noted today.

## 2015-08-22 NOTE — Assessment & Plan Note (Signed)
Well controlled.  He is on propranolol.  No obvious tremor today in clinic.   Continue propranolol

## 2015-08-22 NOTE — Patient Instructions (Signed)
[name] - -  It was a pleasure to see you today!  Your diabetes is very well controlled.  Please keep doing your exercise and eating right!  Please come back and see me in 4 months.  Thank you!

## 2015-08-22 NOTE — Assessment & Plan Note (Signed)
He has chronic back and knee pain.  He has been maintained on a low dose of hydrocodone-apap for a prolonged period of time.  We discussed his lifestyle and what the pain medication allows him to do today.  He reports walking 1.5 miles a day to visit with his daughter at work.  He notes that his active lifestyle has allowed him to lower his A1C and his blood pressure.  He is concerned that if he didn't have the medication, he would not be able to exercise.  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   He is on 77 MME/day Last UDS in 2016 was appropriate except for [facility], which we have discussed cessation together.   I did not do a formal PEG score, but he does utilize the medication for enjoyment of life and activities  I think given his low dose of use, lifestyle and exercise routine, it is reasonable to continue current dose, 3 month supply with 3 scripts at next refill time.

## 2015-08-22 NOTE — Assessment & Plan Note (Signed)
We checked HCV today, pending.

## 2015-08-22 NOTE — Assessment & Plan Note (Signed)
Elevated at first, but he reports rushing to get here on time.  He has taken his medications this morning.   Renal function stable at last check, CMET toady  Continue lisinopril and diltiazem

## 2015-08-23 LAB — CMP14 + ANION GAP
A/G RATIO: 1.4 (ref 1.1–2.5)
ALBUMIN: 4.7 g/dL (ref 3.6–4.8)
ALK PHOS: 84 IU/L (ref 39–117)
ALT: 14 IU/L (ref 0–44)
AST: 20 IU/L (ref 0–40)
Anion Gap: 20 mmol/L — ABNORMAL HIGH (ref 10.0–18.0)
BUN / CREAT RATIO: 17 (ref 10–22)
BUN: 19 mg/dL (ref 8–27)
Bilirubin Total: 0.3 mg/dL (ref 0.0–1.2)
CO2: 25 mmol/L (ref 18–29)
Calcium: 9.9 mg/dL (ref 8.6–10.2)
Chloride: 96 mmol/L (ref 96–106)
Creatinine, Ser: 1.14 mg/dL (ref 0.76–1.27)
GFR calc Af Amer: 78 mL/min/{1.73_m2} (ref >59)
GFR, EST NON AFRICAN AMERICAN: 67 mL/min/{1.73_m2} (ref >59)
GLOBULIN, TOTAL: 3.3 g/dL (ref 1.5–4.5)
Glucose: 166 mg/dL — ABNORMAL HIGH (ref 65–99)
POTASSIUM: 3.7 mmol/L (ref 3.5–5.2)
SODIUM: 141 mmol/L (ref 134–144)
Total Protein: 8 g/dL (ref 6.0–8.5)

## 2015-08-23 LAB — HEPATITIS C ANTIBODY

## 2015-08-27 DIAGNOSIS — M1990 Unspecified osteoarthritis, unspecified site: Secondary | ICD-10-CM | POA: Diagnosis not present

## 2015-08-27 DIAGNOSIS — G250 Essential tremor: Secondary | ICD-10-CM | POA: Diagnosis not present

## 2015-08-27 DIAGNOSIS — G252 Other specified forms of tremor: Secondary | ICD-10-CM | POA: Diagnosis not present

## 2015-08-28 DIAGNOSIS — M1990 Unspecified osteoarthritis, unspecified site: Secondary | ICD-10-CM | POA: Diagnosis not present

## 2015-08-28 DIAGNOSIS — G250 Essential tremor: Secondary | ICD-10-CM | POA: Diagnosis not present

## 2015-08-28 DIAGNOSIS — G252 Other specified forms of tremor: Secondary | ICD-10-CM | POA: Diagnosis not present

## 2015-08-29 DIAGNOSIS — G252 Other specified forms of tremor: Secondary | ICD-10-CM | POA: Diagnosis not present

## 2015-08-29 DIAGNOSIS — M1990 Unspecified osteoarthritis, unspecified site: Secondary | ICD-10-CM | POA: Diagnosis not present

## 2015-08-29 DIAGNOSIS — G250 Essential tremor: Secondary | ICD-10-CM | POA: Diagnosis not present

## 2015-08-30 DIAGNOSIS — G250 Essential tremor: Secondary | ICD-10-CM | POA: Diagnosis not present

## 2015-08-30 DIAGNOSIS — G252 Other specified forms of tremor: Secondary | ICD-10-CM | POA: Diagnosis not present

## 2015-08-30 DIAGNOSIS — M1990 Unspecified osteoarthritis, unspecified site: Secondary | ICD-10-CM | POA: Diagnosis not present

## 2015-08-31 DIAGNOSIS — M1990 Unspecified osteoarthritis, unspecified site: Secondary | ICD-10-CM | POA: Diagnosis not present

## 2015-08-31 DIAGNOSIS — G252 Other specified forms of tremor: Secondary | ICD-10-CM | POA: Diagnosis not present

## 2015-08-31 DIAGNOSIS — G250 Essential tremor: Secondary | ICD-10-CM | POA: Diagnosis not present

## 2015-09-03 ENCOUNTER — Other Ambulatory Visit: Payer: Self-pay | Admitting: Internal Medicine

## 2015-09-03 DIAGNOSIS — G250 Essential tremor: Secondary | ICD-10-CM | POA: Diagnosis not present

## 2015-09-03 DIAGNOSIS — G252 Other specified forms of tremor: Secondary | ICD-10-CM | POA: Diagnosis not present

## 2015-09-03 DIAGNOSIS — M170 Bilateral primary osteoarthritis of knee: Secondary | ICD-10-CM

## 2015-09-03 DIAGNOSIS — M1990 Unspecified osteoarthritis, unspecified site: Secondary | ICD-10-CM | POA: Diagnosis not present

## 2015-09-03 MED ORDER — HYDROCODONE-ACETAMINOPHEN 7.5-325 MG PO TABS: 1.0000 | ORAL_TABLET | Freq: Four times a day (QID) | ORAL | Status: DC | PRN

## 2015-09-03 MED ORDER — HYDROCODONE-ACETAMINOPHEN 7.5-325 MG PO TABS
1.0000 | ORAL_TABLET | Freq: Four times a day (QID) | ORAL | Status: DC | PRN
Start: 2015-09-03 — End: 2015-12-04

## 2015-09-03 NOTE — Telephone Encounter (Signed)
Pt requesting hydrocodone to be filled.  °

## 2015-09-03 NOTE — Telephone Encounter (Signed)
Read Dr Doristine Section last note. Wanted 3 scripts printed. Needs appt late June early July

## 2015-09-03 NOTE — Telephone Encounter (Signed)
Last refill 2/13 OV 3/1 UDS 05/16/15

## 2015-09-03 NOTE — Telephone Encounter (Signed)
Pt informed

## 2015-09-04 DIAGNOSIS — G250 Essential tremor: Secondary | ICD-10-CM | POA: Diagnosis not present

## 2015-09-04 DIAGNOSIS — M1990 Unspecified osteoarthritis, unspecified site: Secondary | ICD-10-CM | POA: Diagnosis not present

## 2015-09-04 DIAGNOSIS — G252 Other specified forms of tremor: Secondary | ICD-10-CM | POA: Diagnosis not present

## 2015-09-05 DIAGNOSIS — M1990 Unspecified osteoarthritis, unspecified site: Secondary | ICD-10-CM | POA: Diagnosis not present

## 2015-09-05 DIAGNOSIS — G250 Essential tremor: Secondary | ICD-10-CM | POA: Diagnosis not present

## 2015-09-05 DIAGNOSIS — G252 Other specified forms of tremor: Secondary | ICD-10-CM | POA: Diagnosis not present

## 2015-09-06 DIAGNOSIS — G252 Other specified forms of tremor: Secondary | ICD-10-CM | POA: Diagnosis not present

## 2015-09-06 DIAGNOSIS — M1990 Unspecified osteoarthritis, unspecified site: Secondary | ICD-10-CM | POA: Diagnosis not present

## 2015-09-06 DIAGNOSIS — G250 Essential tremor: Secondary | ICD-10-CM | POA: Diagnosis not present

## 2015-09-07 DIAGNOSIS — G250 Essential tremor: Secondary | ICD-10-CM | POA: Diagnosis not present

## 2015-09-07 DIAGNOSIS — M1990 Unspecified osteoarthritis, unspecified site: Secondary | ICD-10-CM | POA: Diagnosis not present

## 2015-09-07 DIAGNOSIS — G252 Other specified forms of tremor: Secondary | ICD-10-CM | POA: Diagnosis not present

## 2015-09-10 DIAGNOSIS — G252 Other specified forms of tremor: Secondary | ICD-10-CM | POA: Diagnosis not present

## 2015-09-10 DIAGNOSIS — M1990 Unspecified osteoarthritis, unspecified site: Secondary | ICD-10-CM | POA: Diagnosis not present

## 2015-09-10 DIAGNOSIS — G250 Essential tremor: Secondary | ICD-10-CM | POA: Diagnosis not present

## 2015-09-11 DIAGNOSIS — G250 Essential tremor: Secondary | ICD-10-CM | POA: Diagnosis not present

## 2015-09-11 DIAGNOSIS — M1990 Unspecified osteoarthritis, unspecified site: Secondary | ICD-10-CM | POA: Diagnosis not present

## 2015-09-11 DIAGNOSIS — G252 Other specified forms of tremor: Secondary | ICD-10-CM | POA: Diagnosis not present

## 2015-09-12 DIAGNOSIS — M1990 Unspecified osteoarthritis, unspecified site: Secondary | ICD-10-CM | POA: Diagnosis not present

## 2015-09-12 DIAGNOSIS — G250 Essential tremor: Secondary | ICD-10-CM | POA: Diagnosis not present

## 2015-09-12 DIAGNOSIS — G252 Other specified forms of tremor: Secondary | ICD-10-CM | POA: Diagnosis not present

## 2015-09-13 DIAGNOSIS — G250 Essential tremor: Secondary | ICD-10-CM | POA: Diagnosis not present

## 2015-09-13 DIAGNOSIS — G252 Other specified forms of tremor: Secondary | ICD-10-CM | POA: Diagnosis not present

## 2015-09-13 DIAGNOSIS — M1990 Unspecified osteoarthritis, unspecified site: Secondary | ICD-10-CM | POA: Diagnosis not present

## 2015-09-14 DIAGNOSIS — G250 Essential tremor: Secondary | ICD-10-CM | POA: Diagnosis not present

## 2015-09-14 DIAGNOSIS — M1990 Unspecified osteoarthritis, unspecified site: Secondary | ICD-10-CM | POA: Diagnosis not present

## 2015-09-14 DIAGNOSIS — G252 Other specified forms of tremor: Secondary | ICD-10-CM | POA: Diagnosis not present

## 2015-09-17 DIAGNOSIS — G250 Essential tremor: Secondary | ICD-10-CM | POA: Diagnosis not present

## 2015-09-17 DIAGNOSIS — M1990 Unspecified osteoarthritis, unspecified site: Secondary | ICD-10-CM | POA: Diagnosis not present

## 2015-09-17 DIAGNOSIS — G252 Other specified forms of tremor: Secondary | ICD-10-CM | POA: Diagnosis not present

## 2015-09-18 DIAGNOSIS — G250 Essential tremor: Secondary | ICD-10-CM | POA: Diagnosis not present

## 2015-09-18 DIAGNOSIS — M1990 Unspecified osteoarthritis, unspecified site: Secondary | ICD-10-CM | POA: Diagnosis not present

## 2015-09-18 DIAGNOSIS — G252 Other specified forms of tremor: Secondary | ICD-10-CM | POA: Diagnosis not present

## 2015-09-19 DIAGNOSIS — G252 Other specified forms of tremor: Secondary | ICD-10-CM | POA: Diagnosis not present

## 2015-09-19 DIAGNOSIS — M1990 Unspecified osteoarthritis, unspecified site: Secondary | ICD-10-CM | POA: Diagnosis not present

## 2015-09-19 DIAGNOSIS — G250 Essential tremor: Secondary | ICD-10-CM | POA: Diagnosis not present

## 2015-09-20 ENCOUNTER — Telehealth: Payer: Self-pay | Admitting: Internal Medicine

## 2015-09-20 DIAGNOSIS — G252 Other specified forms of tremor: Secondary | ICD-10-CM | POA: Diagnosis not present

## 2015-09-20 DIAGNOSIS — M1990 Unspecified osteoarthritis, unspecified site: Secondary | ICD-10-CM | POA: Diagnosis not present

## 2015-09-20 DIAGNOSIS — E119 Type 2 diabetes mellitus without complications: Secondary | ICD-10-CM

## 2015-09-20 DIAGNOSIS — G250 Essential tremor: Secondary | ICD-10-CM | POA: Diagnosis not present

## 2015-09-20 NOTE — Telephone Encounter (Signed)
Spoke with patient, he says he needs a new referral to podiatry.  He saw you at the beginning of month and isn't back until June.  Can this be done without an appointment?

## 2015-09-20 NOTE — Telephone Encounter (Signed)
Pt needs to speak with nurse

## 2015-09-21 DIAGNOSIS — M1990 Unspecified osteoarthritis, unspecified site: Secondary | ICD-10-CM | POA: Diagnosis not present

## 2015-09-21 DIAGNOSIS — G250 Essential tremor: Secondary | ICD-10-CM | POA: Diagnosis not present

## 2015-09-21 DIAGNOSIS — G252 Other specified forms of tremor: Secondary | ICD-10-CM | POA: Diagnosis not present

## 2015-09-21 NOTE — Telephone Encounter (Signed)
Spoke with patient to inform referral placed, he will be in contact with podiatry to schedule an appointment

## 2015-09-21 NOTE — Telephone Encounter (Signed)
He as DM and Dr Daryll Drown referred in 2016. I am happy to renew the referral.

## 2015-09-24 DIAGNOSIS — M1990 Unspecified osteoarthritis, unspecified site: Secondary | ICD-10-CM | POA: Diagnosis not present

## 2015-09-24 DIAGNOSIS — G252 Other specified forms of tremor: Secondary | ICD-10-CM | POA: Diagnosis not present

## 2015-09-24 DIAGNOSIS — G250 Essential tremor: Secondary | ICD-10-CM | POA: Diagnosis not present

## 2015-09-25 DIAGNOSIS — G252 Other specified forms of tremor: Secondary | ICD-10-CM | POA: Diagnosis not present

## 2015-09-25 DIAGNOSIS — G250 Essential tremor: Secondary | ICD-10-CM | POA: Diagnosis not present

## 2015-09-25 DIAGNOSIS — M1990 Unspecified osteoarthritis, unspecified site: Secondary | ICD-10-CM | POA: Diagnosis not present

## 2015-09-26 DIAGNOSIS — G252 Other specified forms of tremor: Secondary | ICD-10-CM | POA: Diagnosis not present

## 2015-09-26 DIAGNOSIS — G250 Essential tremor: Secondary | ICD-10-CM | POA: Diagnosis not present

## 2015-09-26 DIAGNOSIS — M1990 Unspecified osteoarthritis, unspecified site: Secondary | ICD-10-CM | POA: Diagnosis not present

## 2015-09-27 DIAGNOSIS — G250 Essential tremor: Secondary | ICD-10-CM | POA: Diagnosis not present

## 2015-09-27 DIAGNOSIS — M1990 Unspecified osteoarthritis, unspecified site: Secondary | ICD-10-CM | POA: Diagnosis not present

## 2015-09-27 DIAGNOSIS — G252 Other specified forms of tremor: Secondary | ICD-10-CM | POA: Diagnosis not present

## 2015-09-28 DIAGNOSIS — G252 Other specified forms of tremor: Secondary | ICD-10-CM | POA: Diagnosis not present

## 2015-09-28 DIAGNOSIS — M1990 Unspecified osteoarthritis, unspecified site: Secondary | ICD-10-CM | POA: Diagnosis not present

## 2015-09-28 DIAGNOSIS — G250 Essential tremor: Secondary | ICD-10-CM | POA: Diagnosis not present

## 2015-10-01 ENCOUNTER — Other Ambulatory Visit: Payer: Self-pay | Admitting: Internal Medicine

## 2015-10-01 DIAGNOSIS — G252 Other specified forms of tremor: Secondary | ICD-10-CM | POA: Diagnosis not present

## 2015-10-01 DIAGNOSIS — G250 Essential tremor: Secondary | ICD-10-CM | POA: Diagnosis not present

## 2015-10-01 DIAGNOSIS — M1990 Unspecified osteoarthritis, unspecified site: Secondary | ICD-10-CM | POA: Diagnosis not present

## 2015-10-02 DIAGNOSIS — G250 Essential tremor: Secondary | ICD-10-CM | POA: Diagnosis not present

## 2015-10-02 DIAGNOSIS — M1990 Unspecified osteoarthritis, unspecified site: Secondary | ICD-10-CM | POA: Diagnosis not present

## 2015-10-02 DIAGNOSIS — G252 Other specified forms of tremor: Secondary | ICD-10-CM | POA: Diagnosis not present

## 2015-10-03 DIAGNOSIS — G250 Essential tremor: Secondary | ICD-10-CM | POA: Diagnosis not present

## 2015-10-03 DIAGNOSIS — G252 Other specified forms of tremor: Secondary | ICD-10-CM | POA: Diagnosis not present

## 2015-10-03 DIAGNOSIS — M1990 Unspecified osteoarthritis, unspecified site: Secondary | ICD-10-CM | POA: Diagnosis not present

## 2015-10-04 DIAGNOSIS — G250 Essential tremor: Secondary | ICD-10-CM | POA: Diagnosis not present

## 2015-10-04 DIAGNOSIS — G252 Other specified forms of tremor: Secondary | ICD-10-CM | POA: Diagnosis not present

## 2015-10-04 DIAGNOSIS — M1990 Unspecified osteoarthritis, unspecified site: Secondary | ICD-10-CM | POA: Diagnosis not present

## 2015-10-08 DIAGNOSIS — M1990 Unspecified osteoarthritis, unspecified site: Secondary | ICD-10-CM | POA: Diagnosis not present

## 2015-10-08 DIAGNOSIS — G250 Essential tremor: Secondary | ICD-10-CM | POA: Diagnosis not present

## 2015-10-08 DIAGNOSIS — G252 Other specified forms of tremor: Secondary | ICD-10-CM | POA: Diagnosis not present

## 2015-10-09 DIAGNOSIS — G252 Other specified forms of tremor: Secondary | ICD-10-CM | POA: Diagnosis not present

## 2015-10-09 DIAGNOSIS — G250 Essential tremor: Secondary | ICD-10-CM | POA: Diagnosis not present

## 2015-10-09 DIAGNOSIS — M1990 Unspecified osteoarthritis, unspecified site: Secondary | ICD-10-CM | POA: Diagnosis not present

## 2015-10-10 DIAGNOSIS — M1990 Unspecified osteoarthritis, unspecified site: Secondary | ICD-10-CM | POA: Diagnosis not present

## 2015-10-10 DIAGNOSIS — G252 Other specified forms of tremor: Secondary | ICD-10-CM | POA: Diagnosis not present

## 2015-10-10 DIAGNOSIS — G250 Essential tremor: Secondary | ICD-10-CM | POA: Diagnosis not present

## 2015-10-11 DIAGNOSIS — M1990 Unspecified osteoarthritis, unspecified site: Secondary | ICD-10-CM | POA: Diagnosis not present

## 2015-10-11 DIAGNOSIS — G250 Essential tremor: Secondary | ICD-10-CM | POA: Diagnosis not present

## 2015-10-11 DIAGNOSIS — L84 Corns and callosities: Secondary | ICD-10-CM | POA: Diagnosis not present

## 2015-10-11 DIAGNOSIS — G252 Other specified forms of tremor: Secondary | ICD-10-CM | POA: Diagnosis not present

## 2015-10-11 DIAGNOSIS — L602 Onychogryphosis: Secondary | ICD-10-CM | POA: Diagnosis not present

## 2015-10-11 DIAGNOSIS — E1351 Other specified diabetes mellitus with diabetic peripheral angiopathy without gangrene: Secondary | ICD-10-CM | POA: Diagnosis not present

## 2015-10-12 DIAGNOSIS — G250 Essential tremor: Secondary | ICD-10-CM | POA: Diagnosis not present

## 2015-10-12 DIAGNOSIS — M1990 Unspecified osteoarthritis, unspecified site: Secondary | ICD-10-CM | POA: Diagnosis not present

## 2015-10-12 DIAGNOSIS — G252 Other specified forms of tremor: Secondary | ICD-10-CM | POA: Diagnosis not present

## 2015-10-15 DIAGNOSIS — G250 Essential tremor: Secondary | ICD-10-CM | POA: Diagnosis not present

## 2015-10-15 DIAGNOSIS — G252 Other specified forms of tremor: Secondary | ICD-10-CM | POA: Diagnosis not present

## 2015-10-15 DIAGNOSIS — M1990 Unspecified osteoarthritis, unspecified site: Secondary | ICD-10-CM | POA: Diagnosis not present

## 2015-10-16 DIAGNOSIS — G250 Essential tremor: Secondary | ICD-10-CM | POA: Diagnosis not present

## 2015-10-16 DIAGNOSIS — M1990 Unspecified osteoarthritis, unspecified site: Secondary | ICD-10-CM | POA: Diagnosis not present

## 2015-10-16 DIAGNOSIS — G252 Other specified forms of tremor: Secondary | ICD-10-CM | POA: Diagnosis not present

## 2015-10-17 DIAGNOSIS — M1990 Unspecified osteoarthritis, unspecified site: Secondary | ICD-10-CM | POA: Diagnosis not present

## 2015-10-17 DIAGNOSIS — G252 Other specified forms of tremor: Secondary | ICD-10-CM | POA: Diagnosis not present

## 2015-10-17 DIAGNOSIS — G250 Essential tremor: Secondary | ICD-10-CM | POA: Diagnosis not present

## 2015-10-18 DIAGNOSIS — M1990 Unspecified osteoarthritis, unspecified site: Secondary | ICD-10-CM | POA: Diagnosis not present

## 2015-10-18 DIAGNOSIS — G252 Other specified forms of tremor: Secondary | ICD-10-CM | POA: Diagnosis not present

## 2015-10-18 DIAGNOSIS — G250 Essential tremor: Secondary | ICD-10-CM | POA: Diagnosis not present

## 2015-10-19 DIAGNOSIS — G250 Essential tremor: Secondary | ICD-10-CM | POA: Diagnosis not present

## 2015-10-19 DIAGNOSIS — M1990 Unspecified osteoarthritis, unspecified site: Secondary | ICD-10-CM | POA: Diagnosis not present

## 2015-10-19 DIAGNOSIS — G252 Other specified forms of tremor: Secondary | ICD-10-CM | POA: Diagnosis not present

## 2015-10-22 DIAGNOSIS — G252 Other specified forms of tremor: Secondary | ICD-10-CM | POA: Diagnosis not present

## 2015-10-22 DIAGNOSIS — G250 Essential tremor: Secondary | ICD-10-CM | POA: Diagnosis not present

## 2015-10-22 DIAGNOSIS — M1990 Unspecified osteoarthritis, unspecified site: Secondary | ICD-10-CM | POA: Diagnosis not present

## 2015-10-23 DIAGNOSIS — M1990 Unspecified osteoarthritis, unspecified site: Secondary | ICD-10-CM | POA: Diagnosis not present

## 2015-10-23 DIAGNOSIS — G250 Essential tremor: Secondary | ICD-10-CM | POA: Diagnosis not present

## 2015-10-23 DIAGNOSIS — G252 Other specified forms of tremor: Secondary | ICD-10-CM | POA: Diagnosis not present

## 2015-10-24 DIAGNOSIS — G252 Other specified forms of tremor: Secondary | ICD-10-CM | POA: Diagnosis not present

## 2015-10-24 DIAGNOSIS — M1990 Unspecified osteoarthritis, unspecified site: Secondary | ICD-10-CM | POA: Diagnosis not present

## 2015-10-24 DIAGNOSIS — G250 Essential tremor: Secondary | ICD-10-CM | POA: Diagnosis not present

## 2015-10-25 DIAGNOSIS — G250 Essential tremor: Secondary | ICD-10-CM | POA: Diagnosis not present

## 2015-10-25 DIAGNOSIS — G252 Other specified forms of tremor: Secondary | ICD-10-CM | POA: Diagnosis not present

## 2015-10-25 DIAGNOSIS — M1990 Unspecified osteoarthritis, unspecified site: Secondary | ICD-10-CM | POA: Diagnosis not present

## 2015-10-26 DIAGNOSIS — G250 Essential tremor: Secondary | ICD-10-CM | POA: Diagnosis not present

## 2015-10-26 DIAGNOSIS — M1990 Unspecified osteoarthritis, unspecified site: Secondary | ICD-10-CM | POA: Diagnosis not present

## 2015-10-26 DIAGNOSIS — G252 Other specified forms of tremor: Secondary | ICD-10-CM | POA: Diagnosis not present

## 2015-10-29 DIAGNOSIS — G250 Essential tremor: Secondary | ICD-10-CM | POA: Diagnosis not present

## 2015-10-29 DIAGNOSIS — M1990 Unspecified osteoarthritis, unspecified site: Secondary | ICD-10-CM | POA: Diagnosis not present

## 2015-10-29 DIAGNOSIS — G252 Other specified forms of tremor: Secondary | ICD-10-CM | POA: Diagnosis not present

## 2015-10-30 ENCOUNTER — Other Ambulatory Visit: Payer: Self-pay | Admitting: *Deleted

## 2015-10-30 DIAGNOSIS — G252 Other specified forms of tremor: Secondary | ICD-10-CM | POA: Diagnosis not present

## 2015-10-30 DIAGNOSIS — M1990 Unspecified osteoarthritis, unspecified site: Secondary | ICD-10-CM | POA: Diagnosis not present

## 2015-10-30 DIAGNOSIS — G250 Essential tremor: Secondary | ICD-10-CM | POA: Diagnosis not present

## 2015-10-30 MED ORDER — PRAVASTATIN SODIUM 40 MG PO TABS: 40.0000 mg | ORAL_TABLET | Freq: Every evening | ORAL | Status: DC

## 2015-10-30 NOTE — Telephone Encounter (Signed)
Requesting 90 day supply. 12/12/2015 next appointment.

## 2015-10-31 ENCOUNTER — Other Ambulatory Visit: Payer: Self-pay | Admitting: Internal Medicine

## 2015-10-31 ENCOUNTER — Other Ambulatory Visit: Payer: Self-pay

## 2015-10-31 DIAGNOSIS — G250 Essential tremor: Secondary | ICD-10-CM | POA: Diagnosis not present

## 2015-10-31 DIAGNOSIS — G252 Other specified forms of tremor: Secondary | ICD-10-CM | POA: Diagnosis not present

## 2015-10-31 DIAGNOSIS — M1990 Unspecified osteoarthritis, unspecified site: Secondary | ICD-10-CM | POA: Diagnosis not present

## 2015-11-01 DIAGNOSIS — M1990 Unspecified osteoarthritis, unspecified site: Secondary | ICD-10-CM | POA: Diagnosis not present

## 2015-11-01 DIAGNOSIS — G252 Other specified forms of tremor: Secondary | ICD-10-CM | POA: Diagnosis not present

## 2015-11-01 DIAGNOSIS — G250 Essential tremor: Secondary | ICD-10-CM | POA: Diagnosis not present

## 2015-11-02 DIAGNOSIS — M1990 Unspecified osteoarthritis, unspecified site: Secondary | ICD-10-CM | POA: Diagnosis not present

## 2015-11-02 DIAGNOSIS — G252 Other specified forms of tremor: Secondary | ICD-10-CM | POA: Diagnosis not present

## 2015-11-02 DIAGNOSIS — G250 Essential tremor: Secondary | ICD-10-CM | POA: Diagnosis not present

## 2015-11-05 ENCOUNTER — Other Ambulatory Visit: Payer: Self-pay | Admitting: Internal Medicine

## 2015-11-05 DIAGNOSIS — M1990 Unspecified osteoarthritis, unspecified site: Secondary | ICD-10-CM | POA: Diagnosis not present

## 2015-11-05 DIAGNOSIS — G250 Essential tremor: Secondary | ICD-10-CM | POA: Diagnosis not present

## 2015-11-05 DIAGNOSIS — G252 Other specified forms of tremor: Secondary | ICD-10-CM | POA: Diagnosis not present

## 2015-11-06 DIAGNOSIS — M1990 Unspecified osteoarthritis, unspecified site: Secondary | ICD-10-CM | POA: Diagnosis not present

## 2015-11-06 DIAGNOSIS — G252 Other specified forms of tremor: Secondary | ICD-10-CM | POA: Diagnosis not present

## 2015-11-06 DIAGNOSIS — G250 Essential tremor: Secondary | ICD-10-CM | POA: Diagnosis not present

## 2015-11-07 DIAGNOSIS — G252 Other specified forms of tremor: Secondary | ICD-10-CM | POA: Diagnosis not present

## 2015-11-07 DIAGNOSIS — M1990 Unspecified osteoarthritis, unspecified site: Secondary | ICD-10-CM | POA: Diagnosis not present

## 2015-11-07 DIAGNOSIS — G250 Essential tremor: Secondary | ICD-10-CM | POA: Diagnosis not present

## 2015-11-08 DIAGNOSIS — G250 Essential tremor: Secondary | ICD-10-CM | POA: Diagnosis not present

## 2015-11-08 DIAGNOSIS — M1990 Unspecified osteoarthritis, unspecified site: Secondary | ICD-10-CM | POA: Diagnosis not present

## 2015-11-08 DIAGNOSIS — G252 Other specified forms of tremor: Secondary | ICD-10-CM | POA: Diagnosis not present

## 2015-11-09 DIAGNOSIS — M1990 Unspecified osteoarthritis, unspecified site: Secondary | ICD-10-CM | POA: Diagnosis not present

## 2015-11-09 DIAGNOSIS — G252 Other specified forms of tremor: Secondary | ICD-10-CM | POA: Diagnosis not present

## 2015-11-09 DIAGNOSIS — G250 Essential tremor: Secondary | ICD-10-CM | POA: Diagnosis not present

## 2015-11-12 DIAGNOSIS — G252 Other specified forms of tremor: Secondary | ICD-10-CM | POA: Diagnosis not present

## 2015-11-12 DIAGNOSIS — M1990 Unspecified osteoarthritis, unspecified site: Secondary | ICD-10-CM | POA: Diagnosis not present

## 2015-11-12 DIAGNOSIS — G250 Essential tremor: Secondary | ICD-10-CM | POA: Diagnosis not present

## 2015-11-13 DIAGNOSIS — M1990 Unspecified osteoarthritis, unspecified site: Secondary | ICD-10-CM | POA: Diagnosis not present

## 2015-11-13 DIAGNOSIS — G252 Other specified forms of tremor: Secondary | ICD-10-CM | POA: Diagnosis not present

## 2015-11-13 DIAGNOSIS — G250 Essential tremor: Secondary | ICD-10-CM | POA: Diagnosis not present

## 2015-11-14 DIAGNOSIS — G252 Other specified forms of tremor: Secondary | ICD-10-CM | POA: Diagnosis not present

## 2015-11-14 DIAGNOSIS — G250 Essential tremor: Secondary | ICD-10-CM | POA: Diagnosis not present

## 2015-11-14 DIAGNOSIS — M1990 Unspecified osteoarthritis, unspecified site: Secondary | ICD-10-CM | POA: Diagnosis not present

## 2015-11-15 DIAGNOSIS — M1990 Unspecified osteoarthritis, unspecified site: Secondary | ICD-10-CM | POA: Diagnosis not present

## 2015-11-15 DIAGNOSIS — G250 Essential tremor: Secondary | ICD-10-CM | POA: Diagnosis not present

## 2015-11-15 DIAGNOSIS — G252 Other specified forms of tremor: Secondary | ICD-10-CM | POA: Diagnosis not present

## 2015-11-16 DIAGNOSIS — M1990 Unspecified osteoarthritis, unspecified site: Secondary | ICD-10-CM | POA: Diagnosis not present

## 2015-11-16 DIAGNOSIS — G250 Essential tremor: Secondary | ICD-10-CM | POA: Diagnosis not present

## 2015-11-16 DIAGNOSIS — G252 Other specified forms of tremor: Secondary | ICD-10-CM | POA: Diagnosis not present

## 2015-11-19 DIAGNOSIS — G252 Other specified forms of tremor: Secondary | ICD-10-CM | POA: Diagnosis not present

## 2015-11-19 DIAGNOSIS — G250 Essential tremor: Secondary | ICD-10-CM | POA: Diagnosis not present

## 2015-11-19 DIAGNOSIS — M1990 Unspecified osteoarthritis, unspecified site: Secondary | ICD-10-CM | POA: Diagnosis not present

## 2015-11-20 DIAGNOSIS — G250 Essential tremor: Secondary | ICD-10-CM | POA: Diagnosis not present

## 2015-11-20 DIAGNOSIS — M1990 Unspecified osteoarthritis, unspecified site: Secondary | ICD-10-CM | POA: Diagnosis not present

## 2015-11-20 DIAGNOSIS — G252 Other specified forms of tremor: Secondary | ICD-10-CM | POA: Diagnosis not present

## 2015-11-21 DIAGNOSIS — G252 Other specified forms of tremor: Secondary | ICD-10-CM | POA: Diagnosis not present

## 2015-11-21 DIAGNOSIS — M1990 Unspecified osteoarthritis, unspecified site: Secondary | ICD-10-CM | POA: Diagnosis not present

## 2015-11-21 DIAGNOSIS — G250 Essential tremor: Secondary | ICD-10-CM | POA: Diagnosis not present

## 2015-11-22 DIAGNOSIS — G250 Essential tremor: Secondary | ICD-10-CM | POA: Diagnosis not present

## 2015-11-22 DIAGNOSIS — M1990 Unspecified osteoarthritis, unspecified site: Secondary | ICD-10-CM | POA: Diagnosis not present

## 2015-11-22 DIAGNOSIS — G252 Other specified forms of tremor: Secondary | ICD-10-CM | POA: Diagnosis not present

## 2015-11-23 DIAGNOSIS — G252 Other specified forms of tremor: Secondary | ICD-10-CM | POA: Diagnosis not present

## 2015-11-23 DIAGNOSIS — G250 Essential tremor: Secondary | ICD-10-CM | POA: Diagnosis not present

## 2015-11-23 DIAGNOSIS — M1990 Unspecified osteoarthritis, unspecified site: Secondary | ICD-10-CM | POA: Diagnosis not present

## 2015-11-24 DIAGNOSIS — G252 Other specified forms of tremor: Secondary | ICD-10-CM | POA: Diagnosis not present

## 2015-11-24 DIAGNOSIS — M1990 Unspecified osteoarthritis, unspecified site: Secondary | ICD-10-CM | POA: Diagnosis not present

## 2015-11-24 DIAGNOSIS — G250 Essential tremor: Secondary | ICD-10-CM | POA: Diagnosis not present

## 2015-11-25 DIAGNOSIS — G250 Essential tremor: Secondary | ICD-10-CM | POA: Diagnosis not present

## 2015-11-25 DIAGNOSIS — M1990 Unspecified osteoarthritis, unspecified site: Secondary | ICD-10-CM | POA: Diagnosis not present

## 2015-11-25 DIAGNOSIS — G252 Other specified forms of tremor: Secondary | ICD-10-CM | POA: Diagnosis not present

## 2015-11-26 DIAGNOSIS — G250 Essential tremor: Secondary | ICD-10-CM | POA: Diagnosis not present

## 2015-11-26 DIAGNOSIS — M1990 Unspecified osteoarthritis, unspecified site: Secondary | ICD-10-CM | POA: Diagnosis not present

## 2015-11-26 DIAGNOSIS — G252 Other specified forms of tremor: Secondary | ICD-10-CM | POA: Diagnosis not present

## 2015-11-27 DIAGNOSIS — M1990 Unspecified osteoarthritis, unspecified site: Secondary | ICD-10-CM | POA: Diagnosis not present

## 2015-11-27 DIAGNOSIS — G250 Essential tremor: Secondary | ICD-10-CM | POA: Diagnosis not present

## 2015-11-27 DIAGNOSIS — G252 Other specified forms of tremor: Secondary | ICD-10-CM | POA: Diagnosis not present

## 2015-11-28 DIAGNOSIS — G250 Essential tremor: Secondary | ICD-10-CM | POA: Diagnosis not present

## 2015-11-28 DIAGNOSIS — M1990 Unspecified osteoarthritis, unspecified site: Secondary | ICD-10-CM | POA: Diagnosis not present

## 2015-11-28 DIAGNOSIS — G252 Other specified forms of tremor: Secondary | ICD-10-CM | POA: Diagnosis not present

## 2015-11-29 DIAGNOSIS — G250 Essential tremor: Secondary | ICD-10-CM | POA: Diagnosis not present

## 2015-11-29 DIAGNOSIS — G252 Other specified forms of tremor: Secondary | ICD-10-CM | POA: Diagnosis not present

## 2015-11-29 DIAGNOSIS — M1990 Unspecified osteoarthritis, unspecified site: Secondary | ICD-10-CM | POA: Diagnosis not present

## 2015-11-30 DIAGNOSIS — G250 Essential tremor: Secondary | ICD-10-CM | POA: Diagnosis not present

## 2015-11-30 DIAGNOSIS — G252 Other specified forms of tremor: Secondary | ICD-10-CM | POA: Diagnosis not present

## 2015-11-30 DIAGNOSIS — M1990 Unspecified osteoarthritis, unspecified site: Secondary | ICD-10-CM | POA: Diagnosis not present

## 2015-12-01 DIAGNOSIS — G252 Other specified forms of tremor: Secondary | ICD-10-CM | POA: Diagnosis not present

## 2015-12-01 DIAGNOSIS — M1990 Unspecified osteoarthritis, unspecified site: Secondary | ICD-10-CM | POA: Diagnosis not present

## 2015-12-01 DIAGNOSIS — G250 Essential tremor: Secondary | ICD-10-CM | POA: Diagnosis not present

## 2015-12-02 DIAGNOSIS — G250 Essential tremor: Secondary | ICD-10-CM | POA: Diagnosis not present

## 2015-12-02 DIAGNOSIS — G252 Other specified forms of tremor: Secondary | ICD-10-CM | POA: Diagnosis not present

## 2015-12-02 DIAGNOSIS — M1990 Unspecified osteoarthritis, unspecified site: Secondary | ICD-10-CM | POA: Diagnosis not present

## 2015-12-03 DIAGNOSIS — M1990 Unspecified osteoarthritis, unspecified site: Secondary | ICD-10-CM | POA: Diagnosis not present

## 2015-12-03 DIAGNOSIS — G252 Other specified forms of tremor: Secondary | ICD-10-CM | POA: Diagnosis not present

## 2015-12-03 DIAGNOSIS — G250 Essential tremor: Secondary | ICD-10-CM | POA: Diagnosis not present

## 2015-12-04 ENCOUNTER — Other Ambulatory Visit: Payer: Self-pay

## 2015-12-04 DIAGNOSIS — M170 Bilateral primary osteoarthritis of knee: Secondary | ICD-10-CM

## 2015-12-04 DIAGNOSIS — M1990 Unspecified osteoarthritis, unspecified site: Secondary | ICD-10-CM | POA: Diagnosis not present

## 2015-12-04 DIAGNOSIS — G250 Essential tremor: Secondary | ICD-10-CM | POA: Diagnosis not present

## 2015-12-04 DIAGNOSIS — G252 Other specified forms of tremor: Secondary | ICD-10-CM | POA: Diagnosis not present

## 2015-12-04 NOTE — Telephone Encounter (Signed)
Pt requesting hydrocodone to be filled.  °

## 2015-12-04 NOTE — Telephone Encounter (Signed)
3 scripts 3/13 march, April, may Next appt 6/21 dr Daryll Drown Last uds 04/2015

## 2015-12-05 DIAGNOSIS — G250 Essential tremor: Secondary | ICD-10-CM | POA: Diagnosis not present

## 2015-12-05 DIAGNOSIS — G252 Other specified forms of tremor: Secondary | ICD-10-CM | POA: Diagnosis not present

## 2015-12-05 DIAGNOSIS — M1990 Unspecified osteoarthritis, unspecified site: Secondary | ICD-10-CM | POA: Diagnosis not present

## 2015-12-06 ENCOUNTER — Other Ambulatory Visit: Payer: Self-pay | Admitting: Internal Medicine

## 2015-12-06 DIAGNOSIS — M1990 Unspecified osteoarthritis, unspecified site: Secondary | ICD-10-CM | POA: Diagnosis not present

## 2015-12-06 DIAGNOSIS — M170 Bilateral primary osteoarthritis of knee: Secondary | ICD-10-CM

## 2015-12-06 DIAGNOSIS — G250 Essential tremor: Secondary | ICD-10-CM | POA: Diagnosis not present

## 2015-12-06 DIAGNOSIS — G252 Other specified forms of tremor: Secondary | ICD-10-CM | POA: Diagnosis not present

## 2015-12-06 MED ORDER — HYDROCODONE-ACETAMINOPHEN 7.5-325 MG PO TABS
1.0000 | ORAL_TABLET | Freq: Four times a day (QID) | ORAL | Status: DC | PRN
Start: 2015-12-06 — End: 2015-12-06

## 2015-12-06 MED ORDER — HYDROCODONE-ACETAMINOPHEN 7.5-325 MG PO TABS: 1.0000 | ORAL_TABLET | Freq: Four times a day (QID) | ORAL | Status: DC | PRN

## 2015-12-07 ENCOUNTER — Telehealth: Payer: Self-pay

## 2015-12-07 DIAGNOSIS — G250 Essential tremor: Secondary | ICD-10-CM | POA: Diagnosis not present

## 2015-12-07 DIAGNOSIS — G252 Other specified forms of tremor: Secondary | ICD-10-CM | POA: Diagnosis not present

## 2015-12-07 DIAGNOSIS — M1990 Unspecified osteoarthritis, unspecified site: Secondary | ICD-10-CM | POA: Diagnosis not present

## 2015-12-07 NOTE — Telephone Encounter (Signed)
Lm for rtc, he called and mae informed him script was ready

## 2015-12-07 NOTE — Telephone Encounter (Signed)
Pt want to know if pain meds is ready.

## 2015-12-08 DIAGNOSIS — G250 Essential tremor: Secondary | ICD-10-CM | POA: Diagnosis not present

## 2015-12-08 DIAGNOSIS — G252 Other specified forms of tremor: Secondary | ICD-10-CM | POA: Diagnosis not present

## 2015-12-08 DIAGNOSIS — M1990 Unspecified osteoarthritis, unspecified site: Secondary | ICD-10-CM | POA: Diagnosis not present

## 2015-12-09 DIAGNOSIS — M1990 Unspecified osteoarthritis, unspecified site: Secondary | ICD-10-CM | POA: Diagnosis not present

## 2015-12-09 DIAGNOSIS — G252 Other specified forms of tremor: Secondary | ICD-10-CM | POA: Diagnosis not present

## 2015-12-09 DIAGNOSIS — G250 Essential tremor: Secondary | ICD-10-CM | POA: Diagnosis not present

## 2015-12-10 DIAGNOSIS — G252 Other specified forms of tremor: Secondary | ICD-10-CM | POA: Diagnosis not present

## 2015-12-10 DIAGNOSIS — M1990 Unspecified osteoarthritis, unspecified site: Secondary | ICD-10-CM | POA: Diagnosis not present

## 2015-12-10 DIAGNOSIS — G250 Essential tremor: Secondary | ICD-10-CM | POA: Diagnosis not present

## 2015-12-11 DIAGNOSIS — G252 Other specified forms of tremor: Secondary | ICD-10-CM | POA: Diagnosis not present

## 2015-12-11 DIAGNOSIS — G250 Essential tremor: Secondary | ICD-10-CM | POA: Diagnosis not present

## 2015-12-11 DIAGNOSIS — M1990 Unspecified osteoarthritis, unspecified site: Secondary | ICD-10-CM | POA: Diagnosis not present

## 2015-12-12 ENCOUNTER — Ambulatory Visit (INDEPENDENT_AMBULATORY_CARE_PROVIDER_SITE_OTHER): Payer: Commercial Managed Care - HMO | Admitting: Internal Medicine

## 2015-12-12 ENCOUNTER — Encounter: Payer: Self-pay | Admitting: Internal Medicine

## 2015-12-12 VITALS — BP 122/64 | HR 80 | Temp 98.1°F | Ht 73.0 in | Wt 217.3 lb

## 2015-12-12 DIAGNOSIS — E119 Type 2 diabetes mellitus without complications: Secondary | ICD-10-CM

## 2015-12-12 DIAGNOSIS — M1990 Unspecified osteoarthritis, unspecified site: Secondary | ICD-10-CM | POA: Diagnosis not present

## 2015-12-12 DIAGNOSIS — Z79891 Long term (current) use of opiate analgesic: Secondary | ICD-10-CM

## 2015-12-12 DIAGNOSIS — M170 Bilateral primary osteoarthritis of knee: Secondary | ICD-10-CM | POA: Diagnosis not present

## 2015-12-12 DIAGNOSIS — Z87891 Personal history of nicotine dependence: Secondary | ICD-10-CM

## 2015-12-12 DIAGNOSIS — I10 Essential (primary) hypertension: Secondary | ICD-10-CM

## 2015-12-12 DIAGNOSIS — Z0000 Encounter for general adult medical examination without abnormal findings: Secondary | ICD-10-CM

## 2015-12-12 DIAGNOSIS — G250 Essential tremor: Secondary | ICD-10-CM | POA: Diagnosis not present

## 2015-12-12 DIAGNOSIS — J449 Chronic obstructive pulmonary disease, unspecified: Secondary | ICD-10-CM | POA: Diagnosis not present

## 2015-12-12 DIAGNOSIS — Z7984 Long term (current) use of oral hypoglycemic drugs: Secondary | ICD-10-CM

## 2015-12-12 DIAGNOSIS — Z79899 Other long term (current) drug therapy: Secondary | ICD-10-CM

## 2015-12-12 DIAGNOSIS — G252 Other specified forms of tremor: Secondary | ICD-10-CM | POA: Diagnosis not present

## 2015-12-12 LAB — POCT GLYCOSYLATED HEMOGLOBIN (HGB A1C): HEMOGLOBIN A1C: 7.7

## 2015-12-12 LAB — GLUCOSE, CAPILLARY: Glucose-Capillary: 243 mg/dL — ABNORMAL HIGH (ref 65–99)

## 2015-12-12 NOTE — Progress Notes (Signed)
   Subjective:    Patient ID: [name], male    DOB: 01-Dec-1950, 65 y.o.   MRN: [id number]  3 month follow up for  HPI   [name] is a 65yo man with PMH of COPD, DM2, HTN, HLD, OA who presents for routine follow up.   [name] reports that he is doing well. 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  They are planning to go to [place] tonight.    No complaints.  He is wearing knee braces for his OA with good results.    He is no longer smoking.  He reports medications as prescribed, but he did not bring them in.      Review of Systems  Constitutional: Negative for fever and fatigue.  Eyes: Negative for photophobia and visual disturbance.  Respiratory: Negative for cough and shortness of breath.   Cardiovascular: Negative for chest pain and leg swelling.  Gastrointestinal: Negative for abdominal pain and abdominal distention.  Musculoskeletal: Positive for arthralgias and gait problem (wears braces).  Neurological: Negative for dizziness, light-headedness and headaches.  Psychiatric/Behavioral: Negative for dysphoric mood and decreased concentration.       Objective:   Physical Exam  Constitutional: He is oriented to person, place, and time. He appears well-developed and well-nourished. No distress.  HENT:  Head: Normocephalic and atraumatic.  Eyes: Conjunctivae are normal. No scleral icterus.  Neck: Neck supple.  Cardiovascular: Normal rate and regular rhythm.  Exam reveals no friction rub.   Murmur (soft 2/6 systolic murmur heard best at RUSB) heard. Pulmonary/Chest: Effort normal and breath sounds normal. No respiratory distress. He has no wheezes.  Abdominal: Soft. Bowel sounds are normal. He exhibits no distension.  Musculoskeletal: He exhibits no edema or tenderness.  Wearing knee braces bilaterally  Lymphadenopathy:    He has no cervical adenopathy.  Neurological: He is alert and  oriented to person, place, and time.  Skin: Skin is warm and dry.  Dark discoloration to fingernails  Psychiatric: He has a normal mood and affect. His behavior is normal.       Assessment & Plan:  RTC in 3-4 months for DM follow up.

## 2015-12-12 NOTE — Patient Instructions (Signed)
Mr. Meester - -   It was a pleasure to see you today.   For your diabetes, your A1C is elevated today.  Please, while you are travelling, remember to take your medications on schedule.  Also, below is a healthy eating plan for you.   You will be referred to have screening for lung cancer.  Please make the appointment when you have time.    At next visit, we should get your the Prevnar vaccine for pneumonia.   Thank you!  Continue your other medications as prescribed.   Please come back to see me in 3-4 months.    Diabetes Mellitus and Food It is important for you to manage your blood sugar (glucose) level. Your blood glucose level can be greatly affected by what you eat. Eating healthier foods in the appropriate amounts throughout the day at about the same time each day will help you control your blood glucose level. It can also help slow or prevent worsening of your diabetes mellitus. Healthy eating may even help you improve the level of your blood pressure and reach or maintain a healthy weight.  General recommendations for healthful eating and cooking habits include:  Eating meals and snacks regularly. Avoid going long periods of time without eating to lose weight.  Eating a diet that consists mainly of plant-based foods, such as fruits, vegetables, nuts, legumes, and whole grains.  Using low-heat cooking methods, such as baking, instead of high-heat cooking methods, such as deep frying. Work with your dietitian to make sure you understand how to use the Nutrition Facts information on food labels. HOW CAN FOOD AFFECT ME? Carbohydrates Carbohydrates affect your blood glucose level more than any other type of food. Your dietitian will help you determine how many carbohydrates to eat at each meal and teach you how to count carbohydrates. Counting carbohydrates is important to keep your blood glucose at a healthy level, especially if you are using insulin or taking certain medicines for  diabetes mellitus. Alcohol Alcohol can cause sudden decreases in blood glucose (hypoglycemia), especially if you use insulin or take certain medicines for diabetes mellitus. Hypoglycemia can be a life-threatening condition. Symptoms of hypoglycemia (sleepiness, dizziness, and disorientation) are similar to symptoms of having too much alcohol.  If your health care provider has given you approval to drink alcohol, do so in moderation and use the following guidelines:  Women should not have more than one drink per day, and men should not have more than two drinks per day. One drink is equal to:  12 oz of beer.  5 oz of wine.  1 oz of hard liquor.  Do not drink on an empty stomach.  Keep yourself hydrated. Have water, diet soda, or unsweetened iced tea.  Regular soda, juice, and other mixers might contain a lot of carbohydrates and should be counted. WHAT FOODS ARE NOT RECOMMENDED? As you make food choices, it is important to remember that all foods are not the same. Some foods have fewer nutrients per serving than other foods, even though they might have the same number of calories or carbohydrates. It is difficult to get your body what it needs when you eat foods with fewer nutrients. Examples of foods that you should avoid that are high in calories and carbohydrates but low in nutrients include:  Trans fats (most processed foods list trans fats on the Nutrition Facts label).  Regular soda.  Juice.  Candy.  Sweets, such as cake, pie, doughnuts, and cookies.  Maceo Pro  foods. WHAT FOODS CAN I EAT? Eat nutrient-rich foods, which will nourish your body and keep you healthy. The food you should eat also will depend on several factors, including:  The calories you need.  The medicines you take.  Your weight.  Your blood glucose level.  Your blood pressure level.  Your cholesterol level. You should eat a variety of foods, including:  Protein.  Lean cuts of meat.  Proteins low  in saturated fats, such as fish, egg whites, and beans. Avoid processed meats.  Fruits and vegetables.  Fruits and vegetables that may help control blood glucose levels, such as apples, mangoes, and yams.  Dairy products.  Choose fat-free or low-fat dairy products, such as milk, yogurt, and cheese.  Grains, bread, pasta, and rice.  Choose whole grain products, such as multigrain bread, whole oats, and brown rice. These foods may help control blood pressure.  Fats.  Foods containing healthful fats, such as nuts, avocado, olive oil, canola oil, and fish. DOES EVERYONE WITH DIABETES MELLITUS HAVE THE SAME MEAL PLAN? Because every person with diabetes mellitus is different, there is not one meal plan that works for everyone. It is very important that you meet with a dietitian who will help you create a meal plan that is just right for you.   This information is not intended to replace advice given to you by your health care provider. Make sure you discuss any questions you have with your health care provider.   Document Released: 03/06/2005 Document Revised: 06/30/2014 Document Reviewed: 05/06/2013 Elsevier Interactive Patient Education [employer].

## 2015-12-13 DIAGNOSIS — G250 Essential tremor: Secondary | ICD-10-CM | POA: Diagnosis not present

## 2015-12-13 DIAGNOSIS — M1990 Unspecified osteoarthritis, unspecified site: Secondary | ICD-10-CM | POA: Diagnosis not present

## 2015-12-13 DIAGNOSIS — G252 Other specified forms of tremor: Secondary | ICD-10-CM | POA: Diagnosis not present

## 2015-12-13 NOTE — Assessment & Plan Note (Signed)
OA pain is well controlled with narcotic Rx and knee braces.   He uses the pain medication to be able to walk (a few miles to see daughter) get up in cab of truck.  He states, if he didn't have the medication he would just be sitting in front of the TV all day.   He is on 30 MME per day, which is a lower risk dose.  UDS in 2016 showed appropriate metabolites for hydrocodone.    Continue current therapy.

## 2015-12-13 NOTE — Assessment & Plan Note (Addendum)
Has been recently well controlled.  He denies symptoms of high or low blood sugar.  He denies missing doses of medications.  No change in urinary symptoms or change in vision.  He has no chest pain or SOB.   A1C up to 7.7.  We discussed this at length, he was surprised by the results.  I advised him to ensure that he has enough of his medications on his long trips and to try to eat more healthy on the road, which he noted was pretty difficult.   LDL 48 MAU/Cr 48 (mildly elevated, on lisinopril) Eye - reports having done on [address] with a group from [place] BP 122/64.   Plan Continue glipizide and metformin Dietary education Recheck in 3 months.

## 2015-12-13 NOTE — Assessment & Plan Note (Signed)
Well controlled today.  No complaints.  No cough, no SOB.   He is taking Advair twice per day, spiriva and albuterol.   Continue these medications.

## 2015-12-13 NOTE — Assessment & Plan Note (Signed)
BP is well controlled today at 122/64.  He denies change in vision, chest pain, change in urinary habits.   Plan Continue diltiazem, lisinopril, pravastatin, propranolol.

## 2015-12-13 NOTE — Assessment & Plan Note (Signed)
Colonoscopy: 2008, repeat in 10 years, due in 2018 CT scan for lung cancer: consult placed today.  Prevnar at next visit.   PSA/DRE - patient reports that he does not have a prostate.

## 2015-12-14 DIAGNOSIS — M1990 Unspecified osteoarthritis, unspecified site: Secondary | ICD-10-CM | POA: Diagnosis not present

## 2015-12-14 DIAGNOSIS — G250 Essential tremor: Secondary | ICD-10-CM | POA: Diagnosis not present

## 2015-12-14 DIAGNOSIS — G252 Other specified forms of tremor: Secondary | ICD-10-CM | POA: Diagnosis not present

## 2015-12-15 DIAGNOSIS — G250 Essential tremor: Secondary | ICD-10-CM | POA: Diagnosis not present

## 2015-12-15 DIAGNOSIS — M1990 Unspecified osteoarthritis, unspecified site: Secondary | ICD-10-CM | POA: Diagnosis not present

## 2015-12-15 DIAGNOSIS — G252 Other specified forms of tremor: Secondary | ICD-10-CM | POA: Diagnosis not present

## 2015-12-16 DIAGNOSIS — G250 Essential tremor: Secondary | ICD-10-CM | POA: Diagnosis not present

## 2015-12-16 DIAGNOSIS — G252 Other specified forms of tremor: Secondary | ICD-10-CM | POA: Diagnosis not present

## 2015-12-16 DIAGNOSIS — M1990 Unspecified osteoarthritis, unspecified site: Secondary | ICD-10-CM | POA: Diagnosis not present

## 2015-12-17 DIAGNOSIS — M1990 Unspecified osteoarthritis, unspecified site: Secondary | ICD-10-CM | POA: Diagnosis not present

## 2015-12-17 DIAGNOSIS — G252 Other specified forms of tremor: Secondary | ICD-10-CM | POA: Diagnosis not present

## 2015-12-17 DIAGNOSIS — G250 Essential tremor: Secondary | ICD-10-CM | POA: Diagnosis not present

## 2015-12-18 DIAGNOSIS — G250 Essential tremor: Secondary | ICD-10-CM | POA: Diagnosis not present

## 2015-12-18 DIAGNOSIS — G252 Other specified forms of tremor: Secondary | ICD-10-CM | POA: Diagnosis not present

## 2015-12-18 DIAGNOSIS — M1990 Unspecified osteoarthritis, unspecified site: Secondary | ICD-10-CM | POA: Diagnosis not present

## 2015-12-19 DIAGNOSIS — G252 Other specified forms of tremor: Secondary | ICD-10-CM | POA: Diagnosis not present

## 2015-12-19 DIAGNOSIS — M1990 Unspecified osteoarthritis, unspecified site: Secondary | ICD-10-CM | POA: Diagnosis not present

## 2015-12-19 DIAGNOSIS — G250 Essential tremor: Secondary | ICD-10-CM | POA: Diagnosis not present

## 2015-12-20 DIAGNOSIS — M1990 Unspecified osteoarthritis, unspecified site: Secondary | ICD-10-CM | POA: Diagnosis not present

## 2015-12-20 DIAGNOSIS — G250 Essential tremor: Secondary | ICD-10-CM | POA: Diagnosis not present

## 2015-12-20 DIAGNOSIS — G252 Other specified forms of tremor: Secondary | ICD-10-CM | POA: Diagnosis not present

## 2015-12-21 DIAGNOSIS — G252 Other specified forms of tremor: Secondary | ICD-10-CM | POA: Diagnosis not present

## 2015-12-21 DIAGNOSIS — G250 Essential tremor: Secondary | ICD-10-CM | POA: Diagnosis not present

## 2015-12-21 DIAGNOSIS — M1990 Unspecified osteoarthritis, unspecified site: Secondary | ICD-10-CM | POA: Diagnosis not present

## 2015-12-22 DIAGNOSIS — G250 Essential tremor: Secondary | ICD-10-CM | POA: Diagnosis not present

## 2015-12-22 DIAGNOSIS — M1990 Unspecified osteoarthritis, unspecified site: Secondary | ICD-10-CM | POA: Diagnosis not present

## 2015-12-22 DIAGNOSIS — G252 Other specified forms of tremor: Secondary | ICD-10-CM | POA: Diagnosis not present

## 2015-12-23 DIAGNOSIS — M1990 Unspecified osteoarthritis, unspecified site: Secondary | ICD-10-CM | POA: Diagnosis not present

## 2015-12-23 DIAGNOSIS — G250 Essential tremor: Secondary | ICD-10-CM | POA: Diagnosis not present

## 2015-12-23 DIAGNOSIS — G252 Other specified forms of tremor: Secondary | ICD-10-CM | POA: Diagnosis not present

## 2015-12-24 DIAGNOSIS — M1990 Unspecified osteoarthritis, unspecified site: Secondary | ICD-10-CM | POA: Diagnosis not present

## 2015-12-24 DIAGNOSIS — G252 Other specified forms of tremor: Secondary | ICD-10-CM | POA: Diagnosis not present

## 2015-12-24 DIAGNOSIS — G250 Essential tremor: Secondary | ICD-10-CM | POA: Diagnosis not present

## 2015-12-26 ENCOUNTER — Encounter: Payer: Self-pay | Admitting: Acute Care

## 2015-12-26 DIAGNOSIS — G250 Essential tremor: Secondary | ICD-10-CM | POA: Diagnosis not present

## 2015-12-26 DIAGNOSIS — M1990 Unspecified osteoarthritis, unspecified site: Secondary | ICD-10-CM | POA: Diagnosis not present

## 2015-12-26 DIAGNOSIS — G252 Other specified forms of tremor: Secondary | ICD-10-CM | POA: Diagnosis not present

## 2015-12-27 DIAGNOSIS — G252 Other specified forms of tremor: Secondary | ICD-10-CM | POA: Diagnosis not present

## 2015-12-27 DIAGNOSIS — M1990 Unspecified osteoarthritis, unspecified site: Secondary | ICD-10-CM | POA: Diagnosis not present

## 2015-12-27 DIAGNOSIS — G250 Essential tremor: Secondary | ICD-10-CM | POA: Diagnosis not present

## 2015-12-28 DIAGNOSIS — M1990 Unspecified osteoarthritis, unspecified site: Secondary | ICD-10-CM | POA: Diagnosis not present

## 2015-12-28 DIAGNOSIS — G250 Essential tremor: Secondary | ICD-10-CM | POA: Diagnosis not present

## 2015-12-28 DIAGNOSIS — G252 Other specified forms of tremor: Secondary | ICD-10-CM | POA: Diagnosis not present

## 2015-12-29 DIAGNOSIS — G252 Other specified forms of tremor: Secondary | ICD-10-CM | POA: Diagnosis not present

## 2015-12-29 DIAGNOSIS — G250 Essential tremor: Secondary | ICD-10-CM | POA: Diagnosis not present

## 2015-12-29 DIAGNOSIS — M1990 Unspecified osteoarthritis, unspecified site: Secondary | ICD-10-CM | POA: Diagnosis not present

## 2015-12-30 DIAGNOSIS — G250 Essential tremor: Secondary | ICD-10-CM | POA: Diagnosis not present

## 2015-12-30 DIAGNOSIS — G252 Other specified forms of tremor: Secondary | ICD-10-CM | POA: Diagnosis not present

## 2015-12-30 DIAGNOSIS — M1990 Unspecified osteoarthritis, unspecified site: Secondary | ICD-10-CM | POA: Diagnosis not present

## 2015-12-31 DIAGNOSIS — G252 Other specified forms of tremor: Secondary | ICD-10-CM | POA: Diagnosis not present

## 2015-12-31 DIAGNOSIS — M1990 Unspecified osteoarthritis, unspecified site: Secondary | ICD-10-CM | POA: Diagnosis not present

## 2015-12-31 DIAGNOSIS — G250 Essential tremor: Secondary | ICD-10-CM | POA: Diagnosis not present

## 2016-01-01 DIAGNOSIS — G252 Other specified forms of tremor: Secondary | ICD-10-CM | POA: Diagnosis not present

## 2016-01-01 DIAGNOSIS — G250 Essential tremor: Secondary | ICD-10-CM | POA: Diagnosis not present

## 2016-01-01 DIAGNOSIS — M1990 Unspecified osteoarthritis, unspecified site: Secondary | ICD-10-CM | POA: Diagnosis not present

## 2016-01-02 ENCOUNTER — Encounter: Payer: Self-pay | Admitting: *Deleted

## 2016-01-02 DIAGNOSIS — G250 Essential tremor: Secondary | ICD-10-CM | POA: Diagnosis not present

## 2016-01-02 DIAGNOSIS — M1990 Unspecified osteoarthritis, unspecified site: Secondary | ICD-10-CM | POA: Diagnosis not present

## 2016-01-02 DIAGNOSIS — G252 Other specified forms of tremor: Secondary | ICD-10-CM | POA: Diagnosis not present

## 2016-01-03 ENCOUNTER — Other Ambulatory Visit: Payer: Self-pay

## 2016-01-03 ENCOUNTER — Encounter: Payer: Self-pay | Admitting: Internal Medicine

## 2016-01-03 DIAGNOSIS — G250 Essential tremor: Secondary | ICD-10-CM | POA: Diagnosis not present

## 2016-01-03 DIAGNOSIS — M1990 Unspecified osteoarthritis, unspecified site: Secondary | ICD-10-CM | POA: Diagnosis not present

## 2016-01-03 DIAGNOSIS — G252 Other specified forms of tremor: Secondary | ICD-10-CM | POA: Diagnosis not present

## 2016-01-03 DIAGNOSIS — M170 Bilateral primary osteoarthritis of knee: Secondary | ICD-10-CM

## 2016-01-03 NOTE — Telephone Encounter (Signed)
Pt informed script ready 

## 2016-01-03 NOTE — Telephone Encounter (Signed)
Requesting pain meds to be filled.  

## 2016-01-04 DIAGNOSIS — M1990 Unspecified osteoarthritis, unspecified site: Secondary | ICD-10-CM | POA: Diagnosis not present

## 2016-01-04 DIAGNOSIS — G252 Other specified forms of tremor: Secondary | ICD-10-CM | POA: Diagnosis not present

## 2016-01-04 DIAGNOSIS — G250 Essential tremor: Secondary | ICD-10-CM | POA: Diagnosis not present

## 2016-01-05 DIAGNOSIS — G250 Essential tremor: Secondary | ICD-10-CM | POA: Diagnosis not present

## 2016-01-05 DIAGNOSIS — G252 Other specified forms of tremor: Secondary | ICD-10-CM | POA: Diagnosis not present

## 2016-01-05 DIAGNOSIS — M1990 Unspecified osteoarthritis, unspecified site: Secondary | ICD-10-CM | POA: Diagnosis not present

## 2016-01-06 DIAGNOSIS — G252 Other specified forms of tremor: Secondary | ICD-10-CM | POA: Diagnosis not present

## 2016-01-06 DIAGNOSIS — M1990 Unspecified osteoarthritis, unspecified site: Secondary | ICD-10-CM | POA: Diagnosis not present

## 2016-01-06 DIAGNOSIS — G250 Essential tremor: Secondary | ICD-10-CM | POA: Diagnosis not present

## 2016-01-07 DIAGNOSIS — G250 Essential tremor: Secondary | ICD-10-CM | POA: Diagnosis not present

## 2016-01-07 DIAGNOSIS — G252 Other specified forms of tremor: Secondary | ICD-10-CM | POA: Diagnosis not present

## 2016-01-07 DIAGNOSIS — M1990 Unspecified osteoarthritis, unspecified site: Secondary | ICD-10-CM | POA: Diagnosis not present

## 2016-01-08 DIAGNOSIS — G252 Other specified forms of tremor: Secondary | ICD-10-CM | POA: Diagnosis not present

## 2016-01-08 DIAGNOSIS — M1990 Unspecified osteoarthritis, unspecified site: Secondary | ICD-10-CM | POA: Diagnosis not present

## 2016-01-08 DIAGNOSIS — G250 Essential tremor: Secondary | ICD-10-CM | POA: Diagnosis not present

## 2016-01-09 DIAGNOSIS — G252 Other specified forms of tremor: Secondary | ICD-10-CM | POA: Diagnosis not present

## 2016-01-09 DIAGNOSIS — M1990 Unspecified osteoarthritis, unspecified site: Secondary | ICD-10-CM | POA: Diagnosis not present

## 2016-01-09 DIAGNOSIS — G250 Essential tremor: Secondary | ICD-10-CM | POA: Diagnosis not present

## 2016-01-10 DIAGNOSIS — M1990 Unspecified osteoarthritis, unspecified site: Secondary | ICD-10-CM | POA: Diagnosis not present

## 2016-01-10 DIAGNOSIS — G252 Other specified forms of tremor: Secondary | ICD-10-CM | POA: Diagnosis not present

## 2016-01-10 DIAGNOSIS — G250 Essential tremor: Secondary | ICD-10-CM | POA: Diagnosis not present

## 2016-01-11 ENCOUNTER — Other Ambulatory Visit: Payer: Self-pay | Admitting: Acute Care

## 2016-01-11 DIAGNOSIS — G252 Other specified forms of tremor: Secondary | ICD-10-CM | POA: Diagnosis not present

## 2016-01-11 DIAGNOSIS — Z87891 Personal history of nicotine dependence: Secondary | ICD-10-CM

## 2016-01-11 DIAGNOSIS — G250 Essential tremor: Secondary | ICD-10-CM | POA: Diagnosis not present

## 2016-01-11 DIAGNOSIS — M1990 Unspecified osteoarthritis, unspecified site: Secondary | ICD-10-CM | POA: Diagnosis not present

## 2016-01-12 DIAGNOSIS — G252 Other specified forms of tremor: Secondary | ICD-10-CM | POA: Diagnosis not present

## 2016-01-12 DIAGNOSIS — M1990 Unspecified osteoarthritis, unspecified site: Secondary | ICD-10-CM | POA: Diagnosis not present

## 2016-01-12 DIAGNOSIS — G250 Essential tremor: Secondary | ICD-10-CM | POA: Diagnosis not present

## 2016-01-13 DIAGNOSIS — M1990 Unspecified osteoarthritis, unspecified site: Secondary | ICD-10-CM | POA: Diagnosis not present

## 2016-01-13 DIAGNOSIS — G250 Essential tremor: Secondary | ICD-10-CM | POA: Diagnosis not present

## 2016-01-13 DIAGNOSIS — G252 Other specified forms of tremor: Secondary | ICD-10-CM | POA: Diagnosis not present

## 2016-01-14 DIAGNOSIS — M1990 Unspecified osteoarthritis, unspecified site: Secondary | ICD-10-CM | POA: Diagnosis not present

## 2016-01-14 DIAGNOSIS — G252 Other specified forms of tremor: Secondary | ICD-10-CM | POA: Diagnosis not present

## 2016-01-14 DIAGNOSIS — G250 Essential tremor: Secondary | ICD-10-CM | POA: Diagnosis not present

## 2016-01-15 DIAGNOSIS — G250 Essential tremor: Secondary | ICD-10-CM | POA: Diagnosis not present

## 2016-01-15 DIAGNOSIS — G252 Other specified forms of tremor: Secondary | ICD-10-CM | POA: Diagnosis not present

## 2016-01-15 DIAGNOSIS — M1990 Unspecified osteoarthritis, unspecified site: Secondary | ICD-10-CM | POA: Diagnosis not present

## 2016-01-16 DIAGNOSIS — G252 Other specified forms of tremor: Secondary | ICD-10-CM | POA: Diagnosis not present

## 2016-01-16 DIAGNOSIS — M1990 Unspecified osteoarthritis, unspecified site: Secondary | ICD-10-CM | POA: Diagnosis not present

## 2016-01-16 DIAGNOSIS — G250 Essential tremor: Secondary | ICD-10-CM | POA: Diagnosis not present

## 2016-01-17 ENCOUNTER — Telehealth: Payer: Self-pay | Admitting: Licensed Clinical Social Worker

## 2016-01-17 DIAGNOSIS — M1990 Unspecified osteoarthritis, unspecified site: Secondary | ICD-10-CM | POA: Diagnosis not present

## 2016-01-17 DIAGNOSIS — G250 Essential tremor: Secondary | ICD-10-CM | POA: Diagnosis not present

## 2016-01-17 DIAGNOSIS — G252 Other specified forms of tremor: Secondary | ICD-10-CM | POA: Diagnosis not present

## 2016-01-17 NOTE — Telephone Encounter (Signed)
CSW received voice mail from Mr. Hann.  Pt states his apartment complex faxed forms to [facility] for pt to obtain a handrail, apartment manager awaiting return of forms.  CSW returned call to Mr. Safier, obtained name and phone number of apartment manager.  CSW placed call to apartment manager and requested form to be faxed to [employer] attention.  Awaiting receipt of form.

## 2016-01-18 DIAGNOSIS — G250 Essential tremor: Secondary | ICD-10-CM | POA: Diagnosis not present

## 2016-01-18 DIAGNOSIS — M1990 Unspecified osteoarthritis, unspecified site: Secondary | ICD-10-CM | POA: Diagnosis not present

## 2016-01-18 DIAGNOSIS — G252 Other specified forms of tremor: Secondary | ICD-10-CM | POA: Diagnosis not present

## 2016-01-18 NOTE — Telephone Encounter (Signed)
01/18/16: Fax checked, no receipt of form as of late.

## 2016-01-19 DIAGNOSIS — M1990 Unspecified osteoarthritis, unspecified site: Secondary | ICD-10-CM | POA: Diagnosis not present

## 2016-01-19 DIAGNOSIS — G250 Essential tremor: Secondary | ICD-10-CM | POA: Diagnosis not present

## 2016-01-19 DIAGNOSIS — G252 Other specified forms of tremor: Secondary | ICD-10-CM | POA: Diagnosis not present

## 2016-01-20 DIAGNOSIS — G252 Other specified forms of tremor: Secondary | ICD-10-CM | POA: Diagnosis not present

## 2016-01-20 DIAGNOSIS — M1990 Unspecified osteoarthritis, unspecified site: Secondary | ICD-10-CM | POA: Diagnosis not present

## 2016-01-20 DIAGNOSIS — G250 Essential tremor: Secondary | ICD-10-CM | POA: Diagnosis not present

## 2016-01-21 ENCOUNTER — Other Ambulatory Visit: Payer: Self-pay | Admitting: *Deleted

## 2016-01-21 DIAGNOSIS — E785 Hyperlipidemia, unspecified: Secondary | ICD-10-CM

## 2016-01-21 DIAGNOSIS — G252 Other specified forms of tremor: Secondary | ICD-10-CM | POA: Diagnosis not present

## 2016-01-21 DIAGNOSIS — G250 Essential tremor: Secondary | ICD-10-CM | POA: Diagnosis not present

## 2016-01-21 DIAGNOSIS — M1990 Unspecified osteoarthritis, unspecified site: Secondary | ICD-10-CM | POA: Diagnosis not present

## 2016-01-21 DIAGNOSIS — I10 Essential (primary) hypertension: Secondary | ICD-10-CM

## 2016-01-21 NOTE — Telephone Encounter (Signed)
Requesting new RX for [facility]

## 2016-01-21 NOTE — Telephone Encounter (Signed)
CSW placed call to Mr. Montesino to notify pt, [facility] has yet to receive pt's requested fax to our office.  CSW requested pt to f/u with his apartment complex and request form to be re-faxed to CSW attention.

## 2016-01-22 DIAGNOSIS — M1990 Unspecified osteoarthritis, unspecified site: Secondary | ICD-10-CM | POA: Diagnosis not present

## 2016-01-22 DIAGNOSIS — G250 Essential tremor: Secondary | ICD-10-CM | POA: Diagnosis not present

## 2016-01-22 DIAGNOSIS — G252 Other specified forms of tremor: Secondary | ICD-10-CM | POA: Diagnosis not present

## 2016-01-22 MED ORDER — GLIPIZIDE 10 MG PO TABS: ORAL_TABLET | ORAL | 3 refills | Status: DC

## 2016-01-22 MED ORDER — PRAVASTATIN SODIUM 40 MG PO TABS: 40.0000 mg | ORAL_TABLET | Freq: Every evening | ORAL | 3 refills | Status: DC

## 2016-01-22 MED ORDER — PROPRANOLOL HCL 40 MG PO TABS: 40.0000 mg | ORAL_TABLET | Freq: Two times a day (BID) | ORAL | 3 refills | Status: DC

## 2016-01-22 MED ORDER — LISINOPRIL 20 MG PO TABS: 20.0000 mg | ORAL_TABLET | Freq: Every day | ORAL | 3 refills | Status: DC

## 2016-01-23 DIAGNOSIS — M1990 Unspecified osteoarthritis, unspecified site: Secondary | ICD-10-CM | POA: Diagnosis not present

## 2016-01-23 DIAGNOSIS — G252 Other specified forms of tremor: Secondary | ICD-10-CM | POA: Diagnosis not present

## 2016-01-23 DIAGNOSIS — G250 Essential tremor: Secondary | ICD-10-CM | POA: Diagnosis not present

## 2016-01-24 DIAGNOSIS — G250 Essential tremor: Secondary | ICD-10-CM | POA: Diagnosis not present

## 2016-01-24 DIAGNOSIS — G252 Other specified forms of tremor: Secondary | ICD-10-CM | POA: Diagnosis not present

## 2016-01-24 DIAGNOSIS — M1990 Unspecified osteoarthritis, unspecified site: Secondary | ICD-10-CM | POA: Diagnosis not present

## 2016-01-25 DIAGNOSIS — M1990 Unspecified osteoarthritis, unspecified site: Secondary | ICD-10-CM | POA: Diagnosis not present

## 2016-01-25 DIAGNOSIS — G250 Essential tremor: Secondary | ICD-10-CM | POA: Diagnosis not present

## 2016-01-25 DIAGNOSIS — G252 Other specified forms of tremor: Secondary | ICD-10-CM | POA: Diagnosis not present

## 2016-01-26 DIAGNOSIS — G250 Essential tremor: Secondary | ICD-10-CM | POA: Diagnosis not present

## 2016-01-26 DIAGNOSIS — G252 Other specified forms of tremor: Secondary | ICD-10-CM | POA: Diagnosis not present

## 2016-01-26 DIAGNOSIS — M1990 Unspecified osteoarthritis, unspecified site: Secondary | ICD-10-CM | POA: Diagnosis not present

## 2016-01-27 ENCOUNTER — Other Ambulatory Visit: Payer: Self-pay | Admitting: Internal Medicine

## 2016-01-27 DIAGNOSIS — G250 Essential tremor: Secondary | ICD-10-CM | POA: Diagnosis not present

## 2016-01-27 DIAGNOSIS — M1990 Unspecified osteoarthritis, unspecified site: Secondary | ICD-10-CM | POA: Diagnosis not present

## 2016-01-27 DIAGNOSIS — G252 Other specified forms of tremor: Secondary | ICD-10-CM | POA: Diagnosis not present

## 2016-01-28 DIAGNOSIS — G252 Other specified forms of tremor: Secondary | ICD-10-CM | POA: Diagnosis not present

## 2016-01-28 DIAGNOSIS — M1990 Unspecified osteoarthritis, unspecified site: Secondary | ICD-10-CM | POA: Diagnosis not present

## 2016-01-28 DIAGNOSIS — G250 Essential tremor: Secondary | ICD-10-CM | POA: Diagnosis not present

## 2016-01-29 DIAGNOSIS — G250 Essential tremor: Secondary | ICD-10-CM | POA: Diagnosis not present

## 2016-01-29 DIAGNOSIS — G252 Other specified forms of tremor: Secondary | ICD-10-CM | POA: Diagnosis not present

## 2016-01-29 DIAGNOSIS — M1990 Unspecified osteoarthritis, unspecified site: Secondary | ICD-10-CM | POA: Diagnosis not present

## 2016-01-30 DIAGNOSIS — M1990 Unspecified osteoarthritis, unspecified site: Secondary | ICD-10-CM | POA: Diagnosis not present

## 2016-01-30 DIAGNOSIS — G250 Essential tremor: Secondary | ICD-10-CM | POA: Diagnosis not present

## 2016-01-30 DIAGNOSIS — G252 Other specified forms of tremor: Secondary | ICD-10-CM | POA: Diagnosis not present

## 2016-01-31 ENCOUNTER — Ambulatory Visit (INDEPENDENT_AMBULATORY_CARE_PROVIDER_SITE_OTHER)
Admission: RE | Admit: 2016-01-31 | Discharge: 2016-01-31 | Disposition: A | Discharge status: Home / Self Care | Payer: Commercial Managed Care - HMO | Source: Ambulatory Visit | Attending: Acute Care | Admitting: Acute Care

## 2016-01-31 ENCOUNTER — Ambulatory Visit (INDEPENDENT_AMBULATORY_CARE_PROVIDER_SITE_OTHER): Payer: Commercial Managed Care - HMO | Admitting: Acute Care

## 2016-01-31 ENCOUNTER — Encounter: Payer: Self-pay | Admitting: Acute Care

## 2016-01-31 DIAGNOSIS — G252 Other specified forms of tremor: Secondary | ICD-10-CM | POA: Diagnosis not present

## 2016-01-31 DIAGNOSIS — M1990 Unspecified osteoarthritis, unspecified site: Secondary | ICD-10-CM | POA: Diagnosis not present

## 2016-01-31 DIAGNOSIS — Z87891 Personal history of nicotine dependence: Secondary | ICD-10-CM

## 2016-01-31 DIAGNOSIS — G250 Essential tremor: Secondary | ICD-10-CM | POA: Diagnosis not present

## 2016-01-31 NOTE — Progress Notes (Signed)
Shared Decision Making Visit Lung Cancer Screening Program [phone])   Eligibility:  Age 65 y.o.  Pack Years Smoking History Calculation 42 pack year smoking history (# packs/per year x # years smoked)  Recent History of coughing up blood  no  Unexplained weight loss? no ( >Than 15 pounds within the last 6 months )  Prior History Lung / other cancer no (Diagnosis within the last 5 years already requiring surveillance chest CT Scans).  Smoking Status Former Smoker  Former Smokers: Years since quit: 10 years  Quit Date: 2007  Visit Components:  Discussion included one or more decision making aids. yes  Discussion included risk/benefits of screening. yes  Discussion included potential follow up diagnostic testing for abnormal scans. yes  Discussion included meaning and risk of over diagnosis. yes  Discussion included meaning and risk of False Positives. yes  Discussion included meaning of total radiation exposure. yes  Counseling Included:  Importance of adherence to annual lung cancer LDCT screening. yes  Impact of comorbidities on ability to participate in the program. yes  Ability and willingness to under diagnostic treatment. yes  Smoking Cessation Counseling:  Current Smokers:   Discussed importance of smoking cessation NA Former smoker  Information about tobacco cessation classes and interventions provided to patient. NA; Former smoker  Patient provided with "ticket" for LDCT Scan. yes  Symptomatic Patient. no  Counseling  Diagnosis Code: Tobacco Use Z72.0  Asymptomatic Patient yes  Counseling NA; former smoker  Former Smokers:   Discussed the importance of maintaining cigarette abstinence. yes  Diagnosis Code: Personal History of Nicotine Dependence. [id number]  Information about tobacco cessation classes and interventions provided to patient. Yes  Patient provided with "ticket" for LDCT Scan. yes  Written Order for Lung Cancer Screening with  LDCT placed in Epic. Yes (CT Chest Lung Cancer Screening Low Dose W/O CM) [id number] Z12.2-Screening of respiratory organs Z87.891-Personal history of nicotine dependence   I spent 20 minutes of face to face time with Mr. Yano discussing the risks and benefits of lung cancer screening. We viewed a power point together that explained in detail the above noted topics. We took the time to pause the power point at intervals to allow for questions to be asked and answered to ensure understanding. We discussed that he  had taken the single most powerful action possible to decrease his risk of developing lung cancer when he quit smoking. I counseled him to remain smoke free, and to contact me if he ever had the desire to smoke again so that I can provide resources and tools to help support the effort to remain smoke free. We discussed the time and location of the scan, and that either Gem or I will call with the results within  24-48 hours of receiving them. He has my card and contact information in the event he needs to speak with me, in addition to a copy of the power point we reviewed as a resource. Mr. Moultrie  verbalized understanding of all of the above and had no further questions upon leaving the office.   Magdalen Spatz, NP 01/31/2016

## 2016-02-01 ENCOUNTER — Telehealth: Payer: Self-pay | Admitting: Acute Care

## 2016-02-01 DIAGNOSIS — G252 Other specified forms of tremor: Secondary | ICD-10-CM | POA: Diagnosis not present

## 2016-02-01 DIAGNOSIS — M1990 Unspecified osteoarthritis, unspecified site: Secondary | ICD-10-CM | POA: Diagnosis not present

## 2016-02-01 DIAGNOSIS — G250 Essential tremor: Secondary | ICD-10-CM | POA: Diagnosis not present

## 2016-02-01 DIAGNOSIS — Z87891 Personal history of nicotine dependence: Secondary | ICD-10-CM

## 2016-02-01 NOTE — Telephone Encounter (Signed)
I have called the results of the CT low-dose lung screening scan to [name]. I explained to him that his scan was read as a Lung RADS 2: nodules that are benign in appearance and behavior with a very low likelihood of becoming a clinically active cancer due to size or lack of growth. Recommendation per radiology is for a repeat LDCT in 12 months. I explained that we will order and schedule his repeat scan in 12 months. I also explained to him that I would forward the results of this screening scan to his primary care doctor Dr. Dondra Spry. [name] verbalized understanding of the above and had no further questions at completion of the call. He has my contact information in the event he has any concerns or questions in the future.

## 2016-02-02 DIAGNOSIS — M1990 Unspecified osteoarthritis, unspecified site: Secondary | ICD-10-CM | POA: Diagnosis not present

## 2016-02-02 DIAGNOSIS — G250 Essential tremor: Secondary | ICD-10-CM | POA: Diagnosis not present

## 2016-02-02 DIAGNOSIS — G252 Other specified forms of tremor: Secondary | ICD-10-CM | POA: Diagnosis not present

## 2016-02-03 DIAGNOSIS — G252 Other specified forms of tremor: Secondary | ICD-10-CM | POA: Diagnosis not present

## 2016-02-03 DIAGNOSIS — M1990 Unspecified osteoarthritis, unspecified site: Secondary | ICD-10-CM | POA: Diagnosis not present

## 2016-02-03 DIAGNOSIS — G250 Essential tremor: Secondary | ICD-10-CM | POA: Diagnosis not present

## 2016-02-04 ENCOUNTER — Other Ambulatory Visit: Payer: Self-pay

## 2016-02-04 DIAGNOSIS — M1990 Unspecified osteoarthritis, unspecified site: Secondary | ICD-10-CM | POA: Diagnosis not present

## 2016-02-04 DIAGNOSIS — G252 Other specified forms of tremor: Secondary | ICD-10-CM | POA: Diagnosis not present

## 2016-02-04 DIAGNOSIS — G250 Essential tremor: Secondary | ICD-10-CM | POA: Diagnosis not present

## 2016-02-04 NOTE — Telephone Encounter (Signed)
Lm for rtc 

## 2016-02-04 NOTE — Telephone Encounter (Signed)
Requesting pain med to be filled.  

## 2016-02-05 ENCOUNTER — Telehealth: Payer: Self-pay | Admitting: Licensed Clinical Social Worker

## 2016-02-05 DIAGNOSIS — G252 Other specified forms of tremor: Secondary | ICD-10-CM | POA: Diagnosis not present

## 2016-02-05 DIAGNOSIS — G250 Essential tremor: Secondary | ICD-10-CM | POA: Diagnosis not present

## 2016-02-05 DIAGNOSIS — M1990 Unspecified osteoarthritis, unspecified site: Secondary | ICD-10-CM | POA: Diagnosis not present

## 2016-02-05 NOTE — Telephone Encounter (Signed)
CSW received voice mail from Mr. Fyock regarding request for return of sign reasonable accommodation.  CSW placed call to pt.  Mr. Booras notified that  this worker contacted apartment complex on 01/17/16, provided direct fax number and manager was to fax that day.  CSW f/u on 01/18/16, fax not received and has not been received as of today.  CSW encouraged Mr. Marcinko to request the apartment manager to provide pt with fax confirmation receipt.

## 2016-02-05 NOTE — Telephone Encounter (Signed)
Pt has picked up script, he is waiting for his apt manager to fax the paperwork, he will call when she has done so

## 2016-02-05 NOTE — Telephone Encounter (Signed)
Have spoken to pt, script ready, he ask about papers for housing asking for a rail or ramp, states the manager of his housing has faxed them and not gotten them back also they have not gotten a letter that went with the papers. Confirmed w/ shana that she has not received the paperwork, gave him triage fax # and await the papers.

## 2016-02-05 NOTE — Telephone Encounter (Signed)
Pt called back requesting to speak with a nurse.

## 2016-02-06 DIAGNOSIS — G250 Essential tremor: Secondary | ICD-10-CM | POA: Diagnosis not present

## 2016-02-06 DIAGNOSIS — G252 Other specified forms of tremor: Secondary | ICD-10-CM | POA: Diagnosis not present

## 2016-02-06 DIAGNOSIS — M1990 Unspecified osteoarthritis, unspecified site: Secondary | ICD-10-CM | POA: Diagnosis not present

## 2016-02-07 ENCOUNTER — Telehealth: Payer: Self-pay | Admitting: Licensed Clinical Social Worker

## 2016-02-07 DIAGNOSIS — M1990 Unspecified osteoarthritis, unspecified site: Secondary | ICD-10-CM | POA: Diagnosis not present

## 2016-02-07 DIAGNOSIS — G252 Other specified forms of tremor: Secondary | ICD-10-CM | POA: Diagnosis not present

## 2016-02-07 DIAGNOSIS — G250 Essential tremor: Secondary | ICD-10-CM | POA: Diagnosis not present

## 2016-02-07 NOTE — Telephone Encounter (Signed)
CSW placed call to Mr. Lac to notify patient [facility] received his request for Reasonable Accommodation on 02/06/16.  However, request was for a service animal or companion animal not a hand rail.  Pt states he is not requesting an animal, only a hand rail to his front steps.  Pt went to apartment complex manager but she was not in at this time.  CSW informed Mr. Bolyard, this worker will attempt to strike-through request regarding animal and add hand rail to front steps and forward to Dr. Daryll Drown.  Encouraged Mr. Ryba to follow up with apartment manager regarding the correct form.  [employer] with address on form indicating the wrong reasonable request form was initiated for Mr. Mashek.

## 2016-02-08 DIAGNOSIS — G252 Other specified forms of tremor: Secondary | ICD-10-CM | POA: Diagnosis not present

## 2016-02-08 DIAGNOSIS — G250 Essential tremor: Secondary | ICD-10-CM | POA: Diagnosis not present

## 2016-02-08 DIAGNOSIS — M1990 Unspecified osteoarthritis, unspecified site: Secondary | ICD-10-CM | POA: Diagnosis not present

## 2016-02-09 DIAGNOSIS — M1990 Unspecified osteoarthritis, unspecified site: Secondary | ICD-10-CM | POA: Diagnosis not present

## 2016-02-09 DIAGNOSIS — G250 Essential tremor: Secondary | ICD-10-CM | POA: Diagnosis not present

## 2016-02-09 DIAGNOSIS — G252 Other specified forms of tremor: Secondary | ICD-10-CM | POA: Diagnosis not present

## 2016-02-10 DIAGNOSIS — G252 Other specified forms of tremor: Secondary | ICD-10-CM | POA: Diagnosis not present

## 2016-02-10 DIAGNOSIS — G250 Essential tremor: Secondary | ICD-10-CM | POA: Diagnosis not present

## 2016-02-10 DIAGNOSIS — M1990 Unspecified osteoarthritis, unspecified site: Secondary | ICD-10-CM | POA: Diagnosis not present

## 2016-02-11 DIAGNOSIS — G250 Essential tremor: Secondary | ICD-10-CM | POA: Diagnosis not present

## 2016-02-11 DIAGNOSIS — M1990 Unspecified osteoarthritis, unspecified site: Secondary | ICD-10-CM | POA: Diagnosis not present

## 2016-02-11 DIAGNOSIS — G252 Other specified forms of tremor: Secondary | ICD-10-CM | POA: Diagnosis not present

## 2016-02-12 DIAGNOSIS — G250 Essential tremor: Secondary | ICD-10-CM | POA: Diagnosis not present

## 2016-02-12 DIAGNOSIS — G252 Other specified forms of tremor: Secondary | ICD-10-CM | POA: Diagnosis not present

## 2016-02-12 DIAGNOSIS — M1990 Unspecified osteoarthritis, unspecified site: Secondary | ICD-10-CM | POA: Diagnosis not present

## 2016-02-13 DIAGNOSIS — G250 Essential tremor: Secondary | ICD-10-CM | POA: Diagnosis not present

## 2016-02-13 DIAGNOSIS — M1990 Unspecified osteoarthritis, unspecified site: Secondary | ICD-10-CM | POA: Diagnosis not present

## 2016-02-13 DIAGNOSIS — G252 Other specified forms of tremor: Secondary | ICD-10-CM | POA: Diagnosis not present

## 2016-02-14 DIAGNOSIS — G252 Other specified forms of tremor: Secondary | ICD-10-CM | POA: Diagnosis not present

## 2016-02-14 DIAGNOSIS — G250 Essential tremor: Secondary | ICD-10-CM | POA: Diagnosis not present

## 2016-02-14 DIAGNOSIS — M1990 Unspecified osteoarthritis, unspecified site: Secondary | ICD-10-CM | POA: Diagnosis not present

## 2016-02-15 DIAGNOSIS — M1990 Unspecified osteoarthritis, unspecified site: Secondary | ICD-10-CM | POA: Diagnosis not present

## 2016-02-15 DIAGNOSIS — G250 Essential tremor: Secondary | ICD-10-CM | POA: Diagnosis not present

## 2016-02-15 DIAGNOSIS — G252 Other specified forms of tremor: Secondary | ICD-10-CM | POA: Diagnosis not present

## 2016-02-16 DIAGNOSIS — G250 Essential tremor: Secondary | ICD-10-CM | POA: Diagnosis not present

## 2016-02-16 DIAGNOSIS — G252 Other specified forms of tremor: Secondary | ICD-10-CM | POA: Diagnosis not present

## 2016-02-16 DIAGNOSIS — M1990 Unspecified osteoarthritis, unspecified site: Secondary | ICD-10-CM | POA: Diagnosis not present

## 2016-02-17 DIAGNOSIS — G250 Essential tremor: Secondary | ICD-10-CM | POA: Diagnosis not present

## 2016-02-17 DIAGNOSIS — M1990 Unspecified osteoarthritis, unspecified site: Secondary | ICD-10-CM | POA: Diagnosis not present

## 2016-02-17 DIAGNOSIS — G252 Other specified forms of tremor: Secondary | ICD-10-CM | POA: Diagnosis not present

## 2016-02-18 DIAGNOSIS — G252 Other specified forms of tremor: Secondary | ICD-10-CM | POA: Diagnosis not present

## 2016-02-18 DIAGNOSIS — G250 Essential tremor: Secondary | ICD-10-CM | POA: Diagnosis not present

## 2016-02-18 DIAGNOSIS — M1990 Unspecified osteoarthritis, unspecified site: Secondary | ICD-10-CM | POA: Diagnosis not present

## 2016-02-18 NOTE — Telephone Encounter (Signed)
Pt inquiring about reasonable accommodation form.  CSW informed [name], this worker spoke with apartment manager today.  Apartment Manager, [name] aware form indicated "service animal" but states "they [place] the same form."  Manager aware form is for handrails on stairs.  [name] informed CSW forwarded form to PCP and encouraged pt to allow time for PCP to review forms.  Pt agreeable.

## 2016-02-19 DIAGNOSIS — G252 Other specified forms of tremor: Secondary | ICD-10-CM | POA: Diagnosis not present

## 2016-02-19 DIAGNOSIS — G250 Essential tremor: Secondary | ICD-10-CM | POA: Diagnosis not present

## 2016-02-19 DIAGNOSIS — M1990 Unspecified osteoarthritis, unspecified site: Secondary | ICD-10-CM | POA: Diagnosis not present

## 2016-02-20 DIAGNOSIS — G252 Other specified forms of tremor: Secondary | ICD-10-CM | POA: Diagnosis not present

## 2016-02-20 DIAGNOSIS — M1990 Unspecified osteoarthritis, unspecified site: Secondary | ICD-10-CM | POA: Diagnosis not present

## 2016-02-20 DIAGNOSIS — G250 Essential tremor: Secondary | ICD-10-CM | POA: Diagnosis not present

## 2016-02-21 DIAGNOSIS — M1990 Unspecified osteoarthritis, unspecified site: Secondary | ICD-10-CM | POA: Diagnosis not present

## 2016-02-21 DIAGNOSIS — G250 Essential tremor: Secondary | ICD-10-CM | POA: Diagnosis not present

## 2016-02-21 DIAGNOSIS — G252 Other specified forms of tremor: Secondary | ICD-10-CM | POA: Diagnosis not present

## 2016-02-21 NOTE — Telephone Encounter (Signed)
Will fill out.  Thank you!

## 2016-02-22 DIAGNOSIS — G252 Other specified forms of tremor: Secondary | ICD-10-CM | POA: Diagnosis not present

## 2016-02-22 DIAGNOSIS — G250 Essential tremor: Secondary | ICD-10-CM | POA: Diagnosis not present

## 2016-02-22 DIAGNOSIS — M1990 Unspecified osteoarthritis, unspecified site: Secondary | ICD-10-CM | POA: Diagnosis not present

## 2016-02-23 DIAGNOSIS — G252 Other specified forms of tremor: Secondary | ICD-10-CM | POA: Diagnosis not present

## 2016-02-23 DIAGNOSIS — G250 Essential tremor: Secondary | ICD-10-CM | POA: Diagnosis not present

## 2016-02-23 DIAGNOSIS — M1990 Unspecified osteoarthritis, unspecified site: Secondary | ICD-10-CM | POA: Diagnosis not present

## 2016-02-24 DIAGNOSIS — G252 Other specified forms of tremor: Secondary | ICD-10-CM | POA: Diagnosis not present

## 2016-02-24 DIAGNOSIS — M1990 Unspecified osteoarthritis, unspecified site: Secondary | ICD-10-CM | POA: Diagnosis not present

## 2016-02-24 DIAGNOSIS — G250 Essential tremor: Secondary | ICD-10-CM | POA: Diagnosis not present

## 2016-02-26 DIAGNOSIS — G250 Essential tremor: Secondary | ICD-10-CM | POA: Diagnosis not present

## 2016-02-26 DIAGNOSIS — G252 Other specified forms of tremor: Secondary | ICD-10-CM | POA: Diagnosis not present

## 2016-02-26 DIAGNOSIS — M1990 Unspecified osteoarthritis, unspecified site: Secondary | ICD-10-CM | POA: Diagnosis not present

## 2016-02-27 DIAGNOSIS — G250 Essential tremor: Secondary | ICD-10-CM | POA: Diagnosis not present

## 2016-02-27 DIAGNOSIS — M1990 Unspecified osteoarthritis, unspecified site: Secondary | ICD-10-CM | POA: Diagnosis not present

## 2016-02-27 DIAGNOSIS — G252 Other specified forms of tremor: Secondary | ICD-10-CM | POA: Diagnosis not present

## 2016-02-28 DIAGNOSIS — M1990 Unspecified osteoarthritis, unspecified site: Secondary | ICD-10-CM | POA: Diagnosis not present

## 2016-02-28 DIAGNOSIS — G252 Other specified forms of tremor: Secondary | ICD-10-CM | POA: Diagnosis not present

## 2016-02-28 DIAGNOSIS — G250 Essential tremor: Secondary | ICD-10-CM | POA: Diagnosis not present

## 2016-02-29 DIAGNOSIS — M1990 Unspecified osteoarthritis, unspecified site: Secondary | ICD-10-CM | POA: Diagnosis not present

## 2016-02-29 DIAGNOSIS — G250 Essential tremor: Secondary | ICD-10-CM | POA: Diagnosis not present

## 2016-02-29 DIAGNOSIS — G252 Other specified forms of tremor: Secondary | ICD-10-CM | POA: Diagnosis not present

## 2016-03-01 DIAGNOSIS — G252 Other specified forms of tremor: Secondary | ICD-10-CM | POA: Diagnosis not present

## 2016-03-01 DIAGNOSIS — G250 Essential tremor: Secondary | ICD-10-CM | POA: Diagnosis not present

## 2016-03-01 DIAGNOSIS — M1990 Unspecified osteoarthritis, unspecified site: Secondary | ICD-10-CM | POA: Diagnosis not present

## 2016-03-02 DIAGNOSIS — G252 Other specified forms of tremor: Secondary | ICD-10-CM | POA: Diagnosis not present

## 2016-03-02 DIAGNOSIS — M1990 Unspecified osteoarthritis, unspecified site: Secondary | ICD-10-CM | POA: Diagnosis not present

## 2016-03-02 DIAGNOSIS — G250 Essential tremor: Secondary | ICD-10-CM | POA: Diagnosis not present

## 2016-03-03 DIAGNOSIS — G250 Essential tremor: Secondary | ICD-10-CM | POA: Diagnosis not present

## 2016-03-03 DIAGNOSIS — M1990 Unspecified osteoarthritis, unspecified site: Secondary | ICD-10-CM | POA: Diagnosis not present

## 2016-03-03 DIAGNOSIS — G252 Other specified forms of tremor: Secondary | ICD-10-CM | POA: Diagnosis not present

## 2016-03-04 DIAGNOSIS — G252 Other specified forms of tremor: Secondary | ICD-10-CM | POA: Diagnosis not present

## 2016-03-04 DIAGNOSIS — M1990 Unspecified osteoarthritis, unspecified site: Secondary | ICD-10-CM | POA: Diagnosis not present

## 2016-03-04 DIAGNOSIS — G250 Essential tremor: Secondary | ICD-10-CM | POA: Diagnosis not present

## 2016-03-05 ENCOUNTER — Other Ambulatory Visit: Payer: Self-pay | Admitting: Internal Medicine

## 2016-03-05 DIAGNOSIS — M1990 Unspecified osteoarthritis, unspecified site: Secondary | ICD-10-CM | POA: Diagnosis not present

## 2016-03-05 DIAGNOSIS — G250 Essential tremor: Secondary | ICD-10-CM | POA: Diagnosis not present

## 2016-03-05 DIAGNOSIS — M170 Bilateral primary osteoarthritis of knee: Secondary | ICD-10-CM

## 2016-03-05 DIAGNOSIS — G252 Other specified forms of tremor: Secondary | ICD-10-CM | POA: Diagnosis not present

## 2016-03-05 MED ORDER — HYDROCODONE-ACETAMINOPHEN 7.5-325 MG PO TABS: 1.0000 | ORAL_TABLET | Freq: Four times a day (QID) | ORAL | 0 refills | Status: DC | PRN

## 2016-03-05 NOTE — Telephone Encounter (Signed)
Last written 6/15 x 3 scripts (last pick up 8/15). Last OV 6/21; next appt 03/19/16. UDS 05/15/16.

## 2016-03-05 NOTE — Telephone Encounter (Signed)
Pain med refill °

## 2016-03-05 NOTE — Telephone Encounter (Signed)
Rxs ready for pick -up; pt called.

## 2016-03-05 NOTE — Telephone Encounter (Signed)
Will refill, discuss tox results at next visit.

## 2016-03-06 DIAGNOSIS — G252 Other specified forms of tremor: Secondary | ICD-10-CM | POA: Diagnosis not present

## 2016-03-06 DIAGNOSIS — M1990 Unspecified osteoarthritis, unspecified site: Secondary | ICD-10-CM | POA: Diagnosis not present

## 2016-03-06 DIAGNOSIS — G250 Essential tremor: Secondary | ICD-10-CM | POA: Diagnosis not present

## 2016-03-07 DIAGNOSIS — G250 Essential tremor: Secondary | ICD-10-CM | POA: Diagnosis not present

## 2016-03-07 DIAGNOSIS — G252 Other specified forms of tremor: Secondary | ICD-10-CM | POA: Diagnosis not present

## 2016-03-07 DIAGNOSIS — M1990 Unspecified osteoarthritis, unspecified site: Secondary | ICD-10-CM | POA: Diagnosis not present

## 2016-03-08 DIAGNOSIS — G250 Essential tremor: Secondary | ICD-10-CM | POA: Diagnosis not present

## 2016-03-08 DIAGNOSIS — M1990 Unspecified osteoarthritis, unspecified site: Secondary | ICD-10-CM | POA: Diagnosis not present

## 2016-03-08 DIAGNOSIS — G252 Other specified forms of tremor: Secondary | ICD-10-CM | POA: Diagnosis not present

## 2016-03-09 DIAGNOSIS — G250 Essential tremor: Secondary | ICD-10-CM | POA: Diagnosis not present

## 2016-03-09 DIAGNOSIS — G252 Other specified forms of tremor: Secondary | ICD-10-CM | POA: Diagnosis not present

## 2016-03-09 DIAGNOSIS — M1990 Unspecified osteoarthritis, unspecified site: Secondary | ICD-10-CM | POA: Diagnosis not present

## 2016-03-10 ENCOUNTER — Other Ambulatory Visit: Payer: Self-pay | Admitting: Internal Medicine

## 2016-03-10 DIAGNOSIS — G252 Other specified forms of tremor: Secondary | ICD-10-CM | POA: Diagnosis not present

## 2016-03-10 DIAGNOSIS — M1990 Unspecified osteoarthritis, unspecified site: Secondary | ICD-10-CM | POA: Diagnosis not present

## 2016-03-10 DIAGNOSIS — G250 Essential tremor: Secondary | ICD-10-CM | POA: Diagnosis not present

## 2016-03-11 DIAGNOSIS — G250 Essential tremor: Secondary | ICD-10-CM | POA: Diagnosis not present

## 2016-03-11 DIAGNOSIS — G252 Other specified forms of tremor: Secondary | ICD-10-CM | POA: Diagnosis not present

## 2016-03-11 DIAGNOSIS — M1990 Unspecified osteoarthritis, unspecified site: Secondary | ICD-10-CM | POA: Diagnosis not present

## 2016-03-12 DIAGNOSIS — M1990 Unspecified osteoarthritis, unspecified site: Secondary | ICD-10-CM | POA: Diagnosis not present

## 2016-03-12 DIAGNOSIS — G252 Other specified forms of tremor: Secondary | ICD-10-CM | POA: Diagnosis not present

## 2016-03-12 DIAGNOSIS — G250 Essential tremor: Secondary | ICD-10-CM | POA: Diagnosis not present

## 2016-03-13 DIAGNOSIS — G250 Essential tremor: Secondary | ICD-10-CM | POA: Diagnosis not present

## 2016-03-13 DIAGNOSIS — G252 Other specified forms of tremor: Secondary | ICD-10-CM | POA: Diagnosis not present

## 2016-03-13 DIAGNOSIS — M1990 Unspecified osteoarthritis, unspecified site: Secondary | ICD-10-CM | POA: Diagnosis not present

## 2016-03-14 DIAGNOSIS — M1990 Unspecified osteoarthritis, unspecified site: Secondary | ICD-10-CM | POA: Diagnosis not present

## 2016-03-14 DIAGNOSIS — G250 Essential tremor: Secondary | ICD-10-CM | POA: Diagnosis not present

## 2016-03-14 DIAGNOSIS — G252 Other specified forms of tremor: Secondary | ICD-10-CM | POA: Diagnosis not present

## 2016-03-15 DIAGNOSIS — G252 Other specified forms of tremor: Secondary | ICD-10-CM | POA: Diagnosis not present

## 2016-03-15 DIAGNOSIS — G250 Essential tremor: Secondary | ICD-10-CM | POA: Diagnosis not present

## 2016-03-15 DIAGNOSIS — M1990 Unspecified osteoarthritis, unspecified site: Secondary | ICD-10-CM | POA: Diagnosis not present

## 2016-03-16 DIAGNOSIS — G252 Other specified forms of tremor: Secondary | ICD-10-CM | POA: Diagnosis not present

## 2016-03-16 DIAGNOSIS — M1990 Unspecified osteoarthritis, unspecified site: Secondary | ICD-10-CM | POA: Diagnosis not present

## 2016-03-16 DIAGNOSIS — G250 Essential tremor: Secondary | ICD-10-CM | POA: Diagnosis not present

## 2016-03-17 DIAGNOSIS — G250 Essential tremor: Secondary | ICD-10-CM | POA: Diagnosis not present

## 2016-03-17 DIAGNOSIS — G252 Other specified forms of tremor: Secondary | ICD-10-CM | POA: Diagnosis not present

## 2016-03-17 DIAGNOSIS — M1990 Unspecified osteoarthritis, unspecified site: Secondary | ICD-10-CM | POA: Diagnosis not present

## 2016-03-18 DIAGNOSIS — M1990 Unspecified osteoarthritis, unspecified site: Secondary | ICD-10-CM | POA: Diagnosis not present

## 2016-03-18 DIAGNOSIS — G252 Other specified forms of tremor: Secondary | ICD-10-CM | POA: Diagnosis not present

## 2016-03-18 DIAGNOSIS — G250 Essential tremor: Secondary | ICD-10-CM | POA: Diagnosis not present

## 2016-03-19 ENCOUNTER — Encounter: Payer: Self-pay | Admitting: Internal Medicine

## 2016-03-19 ENCOUNTER — Ambulatory Visit (INDEPENDENT_AMBULATORY_CARE_PROVIDER_SITE_OTHER): Payer: Commercial Managed Care - HMO | Admitting: Internal Medicine

## 2016-03-19 VITALS — BP 116/82 | HR 84 | Temp 98.0°F | Ht 73.0 in | Wt 202.0 lb

## 2016-03-19 DIAGNOSIS — I10 Essential (primary) hypertension: Secondary | ICD-10-CM

## 2016-03-19 DIAGNOSIS — E119 Type 2 diabetes mellitus without complications: Secondary | ICD-10-CM

## 2016-03-19 DIAGNOSIS — H35033 Hypertensive retinopathy, bilateral: Secondary | ICD-10-CM

## 2016-03-19 DIAGNOSIS — M1990 Unspecified osteoarthritis, unspecified site: Secondary | ICD-10-CM | POA: Diagnosis not present

## 2016-03-19 DIAGNOSIS — F1190 Opioid use, unspecified, uncomplicated: Secondary | ICD-10-CM | POA: Diagnosis not present

## 2016-03-19 DIAGNOSIS — E1165 Type 2 diabetes mellitus with hyperglycemia: Secondary | ICD-10-CM | POA: Diagnosis not present

## 2016-03-19 DIAGNOSIS — Z0000 Encounter for general adult medical examination without abnormal findings: Secondary | ICD-10-CM

## 2016-03-19 DIAGNOSIS — H35039 Hypertensive retinopathy, unspecified eye: Secondary | ICD-10-CM | POA: Diagnosis not present

## 2016-03-19 DIAGNOSIS — G252 Other specified forms of tremor: Secondary | ICD-10-CM | POA: Diagnosis not present

## 2016-03-19 DIAGNOSIS — Z7984 Long term (current) use of oral hypoglycemic drugs: Secondary | ICD-10-CM

## 2016-03-19 DIAGNOSIS — Z79899 Other long term (current) drug therapy: Secondary | ICD-10-CM

## 2016-03-19 DIAGNOSIS — M170 Bilateral primary osteoarthritis of knee: Secondary | ICD-10-CM

## 2016-03-19 DIAGNOSIS — Z87891 Personal history of nicotine dependence: Secondary | ICD-10-CM

## 2016-03-19 DIAGNOSIS — Z23 Encounter for immunization: Secondary | ICD-10-CM

## 2016-03-19 DIAGNOSIS — G250 Essential tremor: Secondary | ICD-10-CM | POA: Diagnosis not present

## 2016-03-19 DIAGNOSIS — Z79891 Long term (current) use of opiate analgesic: Secondary | ICD-10-CM

## 2016-03-19 LAB — GLUCOSE, CAPILLARY: GLUCOSE-CAPILLARY: 431 mg/dL — AB (ref 65–99)

## 2016-03-19 LAB — POCT GLYCOSYLATED HEMOGLOBIN (HGB A1C)

## 2016-03-19 MED ORDER — ACCU-CHEK FASTCLIX LANCETS MISC: 5 refills | Status: AC

## 2016-03-19 MED ORDER — ACCU-CHEK GUIDE W/DEVICE KIT: 1.0000 | PACK | Freq: Three times a day (TID) | 0 refills | Status: AC

## 2016-03-19 MED ORDER — GLUCOSE BLOOD VI STRP: ORAL_STRIP | 5 refills | Status: AC

## 2016-03-19 NOTE — Patient Instructions (Addendum)
[name] - -   Your diabetes is not doing well today.  Your A1C is higher, likely because of your change in diet and decreased exercise.   Ms. [name] supplied you with a  Blood sugar Meter today and I would like you to check your blood sugar three times a day before meals for the next 2 weeks and bring them in to the clinic.  Your frequent urination is likely related to your diabetes being out of control.    Please stop at the front desk and make an appointment for the [facility] in 2 weeks.    I will call you if there are any abnormalities in your blood work.    Thank you!

## 2016-03-19 NOTE — Assessment & Plan Note (Signed)
CT scan for lung cancer was negative.  Follow up in 1 year.

## 2016-03-19 NOTE — Progress Notes (Signed)
Sample blood glucose Meter (Accu chek Guide) provided to patient today. Hedacknowledges ability to use meter and where to call for assistance. He plans to exercise and decrease starch and sugary foods to lower blood sugars.

## 2016-03-19 NOTE — Assessment & Plan Note (Signed)
Unfortunately he has had deterioration of control, now with A1C > 14.0.  He reports compliance with his medications of metformin and glipizide, but I wonder if this could be true.  He is not checking his blood sugars.  He has polyuria and polydipsia.  He would prefer to check his BS for 2 weeks and come back to the clinic.  He was given a new meter by Ms. Butch Penny Plyler and instructed on use.  He will call with any concerningly high blood sugars.  Check UA today for glucose and ketones.  He had no AMS and was hemodynamically stable.    Further medications could include GLP-1 or DPPV-4 however, I think he may need to transition directly to insulin.  His BS log will give [place] more information.   I advised him on a better diet and increasing exercise.    If anything concerning shows up on BMET or UA, will call patient back to be seen again in clinic.

## 2016-03-19 NOTE — Progress Notes (Signed)
   Subjective:    Patient ID: [name], male    DOB: March 07, 1951, 65 y.o.   MRN: [id number]  CC: 3 month follow up for diabetes  HPI  [name] is a 65yo man with PMH of HTN, COPD, DM2, ET, back pain with radiculopathy/OA, HLD who presents for follow up of his DM.   At last visit, [name] had an increasing A1C.  Together we made the plan to improve diet and exercise and see if this improved.  Today A1C is >14.0 which is concerning.  He is currently on metformin 1 gm BID and glipizide 20mg  in the AM and 10mg  in the PM.  He reports compliance with his medication, however, his diet has deteriorated with him travelling with his family member and his exercise has decreased as well.  He reports increase urinary frequency and polydipsia as well.  No changes in vision.  Urinary symptoms include up to 4 urinations per day and night which have increased in the last month.  He has no dysuria, pain with urination, abdominal pain or change in urine color.  He has no hematuria.  He drinks a large amount of water and tea per day.    We discussed his recent CT scan of the chest for screening for lung cancer which was not concerning for cancer.  He is scheduled to have another scan in a year.    Review of Systems  Constitutional: Negative for fatigue and unexpected weight change.  Eyes: Negative for visual disturbance.  Cardiovascular: Negative for chest pain and leg swelling.  Endocrine: Positive for polydipsia, polyphagia and polyuria.  Genitourinary: Positive for enuresis. Negative for difficulty urinating and dysuria.  Musculoskeletal: Positive for arthralgias and gait problem.       Objective:   Physical Exam  Constitutional: He is oriented to person, place, and time. He appears well-developed and well-nourished. No distress.  Cardiovascular: Normal rate, regular rhythm and normal heart sounds.   No murmur heard. Pulmonary/Chest: Effort normal and breath sounds normal. No  respiratory distress.  Musculoskeletal: He exhibits no edema or tenderness.  Neurological: He is alert and oriented to person, place, and time.  Psychiatric: He has a normal mood and affect. His behavior is normal.     BMET, UDS, UA today.    Received flu shot      Assessment & Plan:  Follow up in 2 weeks in [facility] then 3 months with me.

## 2016-03-19 NOTE — Assessment & Plan Note (Signed)
BP today is 116/82 which is at goal.  He reports taking his medications as directed.  He is on propranolol, lisinopril, diltiazem and will continue these.

## 2016-03-19 NOTE — Assessment & Plan Note (Signed)
He is treated with chronic hydrocodone.  He is due for UDS.  Narcotic database reviewed today was appropriate.  He uses the medication to perform his ADLs and to exercise, though he has not been doing that.  Advised him to start exercising again.

## 2016-03-19 NOTE — Assessment & Plan Note (Signed)
Reviewed notes from [facility] today.  He has no diabetic retinopathy.  Good control of BP is important and above modification/improvement in DM control.

## 2016-03-20 DIAGNOSIS — G250 Essential tremor: Secondary | ICD-10-CM | POA: Diagnosis not present

## 2016-03-20 DIAGNOSIS — G252 Other specified forms of tremor: Secondary | ICD-10-CM | POA: Diagnosis not present

## 2016-03-20 DIAGNOSIS — M1990 Unspecified osteoarthritis, unspecified site: Secondary | ICD-10-CM | POA: Diagnosis not present

## 2016-03-20 LAB — BMP8+ANION GAP
Anion Gap: 21 mmol/L — ABNORMAL HIGH (ref 10.0–18.0)
BUN / CREAT RATIO: 11 (ref 10–24)
BUN: 11 mg/dL (ref 8–27)
CALCIUM: 10 mg/dL (ref 8.6–10.2)
CHLORIDE: 95 mmol/L — AB (ref 96–106)
CO2: 21 mmol/L (ref 18–29)
Creatinine, Ser: 1.01 mg/dL (ref 0.76–1.27)
GFR, EST AFRICAN AMERICAN: 89 mL/min/{1.73_m2} (ref >59)
GFR, EST NON AFRICAN AMERICAN: 77 mL/min/{1.73_m2} (ref >59)
Glucose: 443 mg/dL — ABNORMAL HIGH (ref 65–99)
POTASSIUM: 4.9 mmol/L (ref 3.5–5.2)
SODIUM: 137 mmol/L (ref 134–144)

## 2016-03-21 DIAGNOSIS — G250 Essential tremor: Secondary | ICD-10-CM | POA: Diagnosis not present

## 2016-03-21 DIAGNOSIS — G252 Other specified forms of tremor: Secondary | ICD-10-CM | POA: Diagnosis not present

## 2016-03-21 DIAGNOSIS — M1990 Unspecified osteoarthritis, unspecified site: Secondary | ICD-10-CM | POA: Diagnosis not present

## 2016-03-22 ENCOUNTER — Other Ambulatory Visit: Payer: Self-pay | Admitting: Internal Medicine

## 2016-03-22 DIAGNOSIS — M1990 Unspecified osteoarthritis, unspecified site: Secondary | ICD-10-CM | POA: Diagnosis not present

## 2016-03-22 DIAGNOSIS — G250 Essential tremor: Secondary | ICD-10-CM | POA: Diagnosis not present

## 2016-03-22 DIAGNOSIS — G252 Other specified forms of tremor: Secondary | ICD-10-CM | POA: Diagnosis not present

## 2016-03-23 DIAGNOSIS — G252 Other specified forms of tremor: Secondary | ICD-10-CM | POA: Diagnosis not present

## 2016-03-23 DIAGNOSIS — M1990 Unspecified osteoarthritis, unspecified site: Secondary | ICD-10-CM | POA: Diagnosis not present

## 2016-03-23 DIAGNOSIS — G250 Essential tremor: Secondary | ICD-10-CM | POA: Diagnosis not present

## 2016-03-24 ENCOUNTER — Other Ambulatory Visit: Payer: Self-pay | Admitting: Internal Medicine

## 2016-03-24 DIAGNOSIS — M1990 Unspecified osteoarthritis, unspecified site: Secondary | ICD-10-CM | POA: Diagnosis not present

## 2016-03-24 DIAGNOSIS — G252 Other specified forms of tremor: Secondary | ICD-10-CM | POA: Diagnosis not present

## 2016-03-24 DIAGNOSIS — G250 Essential tremor: Secondary | ICD-10-CM | POA: Diagnosis not present

## 2016-03-25 DIAGNOSIS — M1990 Unspecified osteoarthritis, unspecified site: Secondary | ICD-10-CM | POA: Diagnosis not present

## 2016-03-25 DIAGNOSIS — G250 Essential tremor: Secondary | ICD-10-CM | POA: Diagnosis not present

## 2016-03-25 DIAGNOSIS — G252 Other specified forms of tremor: Secondary | ICD-10-CM | POA: Diagnosis not present

## 2016-03-26 DIAGNOSIS — G250 Essential tremor: Secondary | ICD-10-CM | POA: Diagnosis not present

## 2016-03-26 DIAGNOSIS — G252 Other specified forms of tremor: Secondary | ICD-10-CM | POA: Diagnosis not present

## 2016-03-26 DIAGNOSIS — M1990 Unspecified osteoarthritis, unspecified site: Secondary | ICD-10-CM | POA: Diagnosis not present

## 2016-03-27 ENCOUNTER — Telehealth: Payer: Self-pay | Admitting: Internal Medicine

## 2016-03-27 DIAGNOSIS — G250 Essential tremor: Secondary | ICD-10-CM | POA: Diagnosis not present

## 2016-03-27 DIAGNOSIS — G252 Other specified forms of tremor: Secondary | ICD-10-CM | POA: Diagnosis not present

## 2016-03-27 DIAGNOSIS — M1990 Unspecified osteoarthritis, unspecified site: Secondary | ICD-10-CM | POA: Diagnosis not present

## 2016-03-27 MED ORDER — SITAGLIPTIN PHOSPHATE 100 MG PO TABS
100.0000 mg | ORAL_TABLET | Freq: Every day | ORAL | 3 refills | Status: DC
Start: 2016-03-27 — End: 2016-06-12

## 2016-03-27 NOTE — Telephone Encounter (Signed)
Called patient regarding his blood work.  Confirmed identity with name and birthdate.  A1C elevated.  Bloodwork as expected, no acidosis thankfully.  Urinary symptoms likely related to high sugar.  [name] and I discussed adding [name] to his regimen to try and get better control of his DM.  He was agreeable to this plan.  Will send Januvia 100mg  daily.  He is to call with any issues.

## 2016-03-28 DIAGNOSIS — G250 Essential tremor: Secondary | ICD-10-CM | POA: Diagnosis not present

## 2016-03-28 DIAGNOSIS — M1990 Unspecified osteoarthritis, unspecified site: Secondary | ICD-10-CM | POA: Diagnosis not present

## 2016-03-28 DIAGNOSIS — G252 Other specified forms of tremor: Secondary | ICD-10-CM | POA: Diagnosis not present

## 2016-03-29 DIAGNOSIS — G252 Other specified forms of tremor: Secondary | ICD-10-CM | POA: Diagnosis not present

## 2016-03-29 DIAGNOSIS — G250 Essential tremor: Secondary | ICD-10-CM | POA: Diagnosis not present

## 2016-03-29 DIAGNOSIS — M1990 Unspecified osteoarthritis, unspecified site: Secondary | ICD-10-CM | POA: Diagnosis not present

## 2016-03-30 ENCOUNTER — Other Ambulatory Visit: Payer: Self-pay | Admitting: Internal Medicine

## 2016-03-30 DIAGNOSIS — M1990 Unspecified osteoarthritis, unspecified site: Secondary | ICD-10-CM | POA: Diagnosis not present

## 2016-03-30 DIAGNOSIS — G252 Other specified forms of tremor: Secondary | ICD-10-CM | POA: Diagnosis not present

## 2016-03-30 DIAGNOSIS — G250 Essential tremor: Secondary | ICD-10-CM | POA: Diagnosis not present

## 2016-03-30 LAB — TOXASSURE SELECT,+ANTIDEPR,UR

## 2016-03-30 LAB — URINALYSIS, ROUTINE W REFLEX MICROSCOPIC
BILIRUBIN UA: NEGATIVE
KETONES UA: NEGATIVE
Leukocytes, UA: NEGATIVE
Nitrite, UA: NEGATIVE
PH UA: 5.5 (ref 5.0–7.5)
PROTEIN UA: NEGATIVE
RBC UA: NEGATIVE
UUROB: 0.2 mg/dL (ref 0.2–1.0)

## 2016-03-31 DIAGNOSIS — G250 Essential tremor: Secondary | ICD-10-CM | POA: Diagnosis not present

## 2016-03-31 DIAGNOSIS — G252 Other specified forms of tremor: Secondary | ICD-10-CM | POA: Diagnosis not present

## 2016-03-31 DIAGNOSIS — M1990 Unspecified osteoarthritis, unspecified site: Secondary | ICD-10-CM | POA: Diagnosis not present

## 2016-04-01 DIAGNOSIS — M1990 Unspecified osteoarthritis, unspecified site: Secondary | ICD-10-CM | POA: Diagnosis not present

## 2016-04-01 DIAGNOSIS — G250 Essential tremor: Secondary | ICD-10-CM | POA: Diagnosis not present

## 2016-04-01 DIAGNOSIS — G252 Other specified forms of tremor: Secondary | ICD-10-CM | POA: Diagnosis not present

## 2016-04-02 DIAGNOSIS — G250 Essential tremor: Secondary | ICD-10-CM | POA: Diagnosis not present

## 2016-04-02 DIAGNOSIS — M1990 Unspecified osteoarthritis, unspecified site: Secondary | ICD-10-CM | POA: Diagnosis not present

## 2016-04-02 DIAGNOSIS — G252 Other specified forms of tremor: Secondary | ICD-10-CM | POA: Diagnosis not present

## 2016-04-03 DIAGNOSIS — G250 Essential tremor: Secondary | ICD-10-CM | POA: Diagnosis not present

## 2016-04-03 DIAGNOSIS — G252 Other specified forms of tremor: Secondary | ICD-10-CM | POA: Diagnosis not present

## 2016-04-03 DIAGNOSIS — M1990 Unspecified osteoarthritis, unspecified site: Secondary | ICD-10-CM | POA: Diagnosis not present

## 2016-04-04 ENCOUNTER — Other Ambulatory Visit: Payer: Self-pay | Admitting: Internal Medicine

## 2016-04-04 DIAGNOSIS — G252 Other specified forms of tremor: Secondary | ICD-10-CM | POA: Diagnosis not present

## 2016-04-04 DIAGNOSIS — M1990 Unspecified osteoarthritis, unspecified site: Secondary | ICD-10-CM | POA: Diagnosis not present

## 2016-04-04 DIAGNOSIS — G250 Essential tremor: Secondary | ICD-10-CM | POA: Diagnosis not present

## 2016-04-04 DIAGNOSIS — M170 Bilateral primary osteoarthritis of knee: Secondary | ICD-10-CM

## 2016-04-04 NOTE — Telephone Encounter (Signed)
HYDROcodone-acetaminophen  Refill request

## 2016-04-05 DIAGNOSIS — G250 Essential tremor: Secondary | ICD-10-CM | POA: Diagnosis not present

## 2016-04-05 DIAGNOSIS — G252 Other specified forms of tremor: Secondary | ICD-10-CM | POA: Diagnosis not present

## 2016-04-05 DIAGNOSIS — M1990 Unspecified osteoarthritis, unspecified site: Secondary | ICD-10-CM | POA: Diagnosis not present

## 2016-04-06 DIAGNOSIS — M1990 Unspecified osteoarthritis, unspecified site: Secondary | ICD-10-CM | POA: Diagnosis not present

## 2016-04-06 DIAGNOSIS — G250 Essential tremor: Secondary | ICD-10-CM | POA: Diagnosis not present

## 2016-04-06 DIAGNOSIS — G252 Other specified forms of tremor: Secondary | ICD-10-CM | POA: Diagnosis not present

## 2016-04-07 DIAGNOSIS — G252 Other specified forms of tremor: Secondary | ICD-10-CM | POA: Diagnosis not present

## 2016-04-07 DIAGNOSIS — G250 Essential tremor: Secondary | ICD-10-CM | POA: Diagnosis not present

## 2016-04-07 DIAGNOSIS — M1990 Unspecified osteoarthritis, unspecified site: Secondary | ICD-10-CM | POA: Diagnosis not present

## 2016-04-08 ENCOUNTER — Other Ambulatory Visit: Payer: Self-pay | Admitting: Internal Medicine

## 2016-04-08 DIAGNOSIS — M1990 Unspecified osteoarthritis, unspecified site: Secondary | ICD-10-CM | POA: Diagnosis not present

## 2016-04-08 DIAGNOSIS — G250 Essential tremor: Secondary | ICD-10-CM | POA: Diagnosis not present

## 2016-04-08 DIAGNOSIS — M170 Bilateral primary osteoarthritis of knee: Secondary | ICD-10-CM

## 2016-04-08 DIAGNOSIS — G252 Other specified forms of tremor: Secondary | ICD-10-CM | POA: Diagnosis not present

## 2016-04-08 MED ORDER — HYDROCODONE-ACETAMINOPHEN 7.5-325 MG PO TABS: 1.0000 | ORAL_TABLET | Freq: Four times a day (QID) | ORAL | 0 refills | Status: DC | PRN

## 2016-04-08 NOTE — Telephone Encounter (Signed)
Last UDS was negative, however, I neglected to ask him what his last date of taking the medication was. I placed an approximate date on the form, based on memory, but would need to confirm with patient.  I will need to repeat the UDS.  In the interim, will refill.  Reviewed the narcotic database and he has appropriate refill history for 12 months.  I am not sure I can trust the results of the UDS given above, so will fill his medications for 3 months and recheck UDS at next visit.

## 2016-04-08 NOTE — Telephone Encounter (Signed)
Pt informed script ready for pick up

## 2016-04-09 DIAGNOSIS — G252 Other specified forms of tremor: Secondary | ICD-10-CM | POA: Diagnosis not present

## 2016-04-09 DIAGNOSIS — G250 Essential tremor: Secondary | ICD-10-CM | POA: Diagnosis not present

## 2016-04-09 DIAGNOSIS — M1990 Unspecified osteoarthritis, unspecified site: Secondary | ICD-10-CM | POA: Diagnosis not present

## 2016-04-10 DIAGNOSIS — M1990 Unspecified osteoarthritis, unspecified site: Secondary | ICD-10-CM | POA: Diagnosis not present

## 2016-04-10 DIAGNOSIS — G252 Other specified forms of tremor: Secondary | ICD-10-CM | POA: Diagnosis not present

## 2016-04-10 DIAGNOSIS — G250 Essential tremor: Secondary | ICD-10-CM | POA: Diagnosis not present

## 2016-04-11 DIAGNOSIS — M1990 Unspecified osteoarthritis, unspecified site: Secondary | ICD-10-CM | POA: Diagnosis not present

## 2016-04-11 DIAGNOSIS — G252 Other specified forms of tremor: Secondary | ICD-10-CM | POA: Diagnosis not present

## 2016-04-11 DIAGNOSIS — G250 Essential tremor: Secondary | ICD-10-CM | POA: Diagnosis not present

## 2016-04-12 DIAGNOSIS — G250 Essential tremor: Secondary | ICD-10-CM | POA: Diagnosis not present

## 2016-04-12 DIAGNOSIS — M1990 Unspecified osteoarthritis, unspecified site: Secondary | ICD-10-CM | POA: Diagnosis not present

## 2016-04-12 DIAGNOSIS — G252 Other specified forms of tremor: Secondary | ICD-10-CM | POA: Diagnosis not present

## 2016-04-13 DIAGNOSIS — G252 Other specified forms of tremor: Secondary | ICD-10-CM | POA: Diagnosis not present

## 2016-04-13 DIAGNOSIS — G250 Essential tremor: Secondary | ICD-10-CM | POA: Diagnosis not present

## 2016-04-13 DIAGNOSIS — M1990 Unspecified osteoarthritis, unspecified site: Secondary | ICD-10-CM | POA: Diagnosis not present

## 2016-04-14 ENCOUNTER — Other Ambulatory Visit: Payer: Self-pay | Admitting: Internal Medicine

## 2016-04-14 DIAGNOSIS — G252 Other specified forms of tremor: Secondary | ICD-10-CM | POA: Diagnosis not present

## 2016-04-14 DIAGNOSIS — G250 Essential tremor: Secondary | ICD-10-CM | POA: Diagnosis not present

## 2016-04-14 DIAGNOSIS — M1990 Unspecified osteoarthritis, unspecified site: Secondary | ICD-10-CM | POA: Diagnosis not present

## 2016-04-15 DIAGNOSIS — G250 Essential tremor: Secondary | ICD-10-CM | POA: Diagnosis not present

## 2016-04-15 DIAGNOSIS — G252 Other specified forms of tremor: Secondary | ICD-10-CM | POA: Diagnosis not present

## 2016-04-15 DIAGNOSIS — M1990 Unspecified osteoarthritis, unspecified site: Secondary | ICD-10-CM | POA: Diagnosis not present

## 2016-04-16 DIAGNOSIS — M1990 Unspecified osteoarthritis, unspecified site: Secondary | ICD-10-CM | POA: Diagnosis not present

## 2016-04-16 DIAGNOSIS — G250 Essential tremor: Secondary | ICD-10-CM | POA: Diagnosis not present

## 2016-04-16 DIAGNOSIS — G252 Other specified forms of tremor: Secondary | ICD-10-CM | POA: Diagnosis not present

## 2016-04-17 DIAGNOSIS — G250 Essential tremor: Secondary | ICD-10-CM | POA: Diagnosis not present

## 2016-04-17 DIAGNOSIS — G252 Other specified forms of tremor: Secondary | ICD-10-CM | POA: Diagnosis not present

## 2016-04-17 DIAGNOSIS — M1990 Unspecified osteoarthritis, unspecified site: Secondary | ICD-10-CM | POA: Diagnosis not present

## 2016-04-18 DIAGNOSIS — G252 Other specified forms of tremor: Secondary | ICD-10-CM | POA: Diagnosis not present

## 2016-04-18 DIAGNOSIS — G250 Essential tremor: Secondary | ICD-10-CM | POA: Diagnosis not present

## 2016-04-18 DIAGNOSIS — M1990 Unspecified osteoarthritis, unspecified site: Secondary | ICD-10-CM | POA: Diagnosis not present

## 2016-04-19 DIAGNOSIS — G252 Other specified forms of tremor: Secondary | ICD-10-CM | POA: Diagnosis not present

## 2016-04-19 DIAGNOSIS — M1990 Unspecified osteoarthritis, unspecified site: Secondary | ICD-10-CM | POA: Diagnosis not present

## 2016-04-19 DIAGNOSIS — G250 Essential tremor: Secondary | ICD-10-CM | POA: Diagnosis not present

## 2016-04-20 DIAGNOSIS — G252 Other specified forms of tremor: Secondary | ICD-10-CM | POA: Diagnosis not present

## 2016-04-20 DIAGNOSIS — G250 Essential tremor: Secondary | ICD-10-CM | POA: Diagnosis not present

## 2016-04-20 DIAGNOSIS — M1990 Unspecified osteoarthritis, unspecified site: Secondary | ICD-10-CM | POA: Diagnosis not present

## 2016-04-21 DIAGNOSIS — G250 Essential tremor: Secondary | ICD-10-CM | POA: Diagnosis not present

## 2016-04-21 DIAGNOSIS — G252 Other specified forms of tremor: Secondary | ICD-10-CM | POA: Diagnosis not present

## 2016-04-21 DIAGNOSIS — M1990 Unspecified osteoarthritis, unspecified site: Secondary | ICD-10-CM | POA: Diagnosis not present

## 2016-04-22 ENCOUNTER — Other Ambulatory Visit: Payer: Self-pay | Admitting: Internal Medicine

## 2016-04-22 DIAGNOSIS — M1990 Unspecified osteoarthritis, unspecified site: Secondary | ICD-10-CM | POA: Diagnosis not present

## 2016-04-22 DIAGNOSIS — G250 Essential tremor: Secondary | ICD-10-CM | POA: Diagnosis not present

## 2016-04-22 DIAGNOSIS — G252 Other specified forms of tremor: Secondary | ICD-10-CM | POA: Diagnosis not present

## 2016-04-23 DIAGNOSIS — M1990 Unspecified osteoarthritis, unspecified site: Secondary | ICD-10-CM | POA: Diagnosis not present

## 2016-04-23 DIAGNOSIS — G250 Essential tremor: Secondary | ICD-10-CM | POA: Diagnosis not present

## 2016-04-23 DIAGNOSIS — G252 Other specified forms of tremor: Secondary | ICD-10-CM | POA: Diagnosis not present

## 2016-04-24 DIAGNOSIS — M1990 Unspecified osteoarthritis, unspecified site: Secondary | ICD-10-CM | POA: Diagnosis not present

## 2016-04-24 DIAGNOSIS — G250 Essential tremor: Secondary | ICD-10-CM | POA: Diagnosis not present

## 2016-04-24 DIAGNOSIS — G252 Other specified forms of tremor: Secondary | ICD-10-CM | POA: Diagnosis not present

## 2016-04-25 DIAGNOSIS — M1990 Unspecified osteoarthritis, unspecified site: Secondary | ICD-10-CM | POA: Diagnosis not present

## 2016-04-25 DIAGNOSIS — G250 Essential tremor: Secondary | ICD-10-CM | POA: Diagnosis not present

## 2016-04-25 DIAGNOSIS — G252 Other specified forms of tremor: Secondary | ICD-10-CM | POA: Diagnosis not present

## 2016-04-26 DIAGNOSIS — G250 Essential tremor: Secondary | ICD-10-CM | POA: Diagnosis not present

## 2016-04-26 DIAGNOSIS — M1990 Unspecified osteoarthritis, unspecified site: Secondary | ICD-10-CM | POA: Diagnosis not present

## 2016-04-26 DIAGNOSIS — G252 Other specified forms of tremor: Secondary | ICD-10-CM | POA: Diagnosis not present

## 2016-04-27 DIAGNOSIS — G252 Other specified forms of tremor: Secondary | ICD-10-CM | POA: Diagnosis not present

## 2016-04-27 DIAGNOSIS — G250 Essential tremor: Secondary | ICD-10-CM | POA: Diagnosis not present

## 2016-04-27 DIAGNOSIS — M1990 Unspecified osteoarthritis, unspecified site: Secondary | ICD-10-CM | POA: Diagnosis not present

## 2016-04-28 DIAGNOSIS — M1990 Unspecified osteoarthritis, unspecified site: Secondary | ICD-10-CM | POA: Diagnosis not present

## 2016-04-28 DIAGNOSIS — G250 Essential tremor: Secondary | ICD-10-CM | POA: Diagnosis not present

## 2016-04-28 DIAGNOSIS — G252 Other specified forms of tremor: Secondary | ICD-10-CM | POA: Diagnosis not present

## 2016-04-29 DIAGNOSIS — G250 Essential tremor: Secondary | ICD-10-CM | POA: Diagnosis not present

## 2016-04-29 DIAGNOSIS — M1990 Unspecified osteoarthritis, unspecified site: Secondary | ICD-10-CM | POA: Diagnosis not present

## 2016-04-29 DIAGNOSIS — G252 Other specified forms of tremor: Secondary | ICD-10-CM | POA: Diagnosis not present

## 2016-04-30 DIAGNOSIS — G252 Other specified forms of tremor: Secondary | ICD-10-CM | POA: Diagnosis not present

## 2016-04-30 DIAGNOSIS — M1990 Unspecified osteoarthritis, unspecified site: Secondary | ICD-10-CM | POA: Diagnosis not present

## 2016-04-30 DIAGNOSIS — G250 Essential tremor: Secondary | ICD-10-CM | POA: Diagnosis not present

## 2016-05-01 DIAGNOSIS — G250 Essential tremor: Secondary | ICD-10-CM | POA: Diagnosis not present

## 2016-05-01 DIAGNOSIS — M1990 Unspecified osteoarthritis, unspecified site: Secondary | ICD-10-CM | POA: Diagnosis not present

## 2016-05-01 DIAGNOSIS — G252 Other specified forms of tremor: Secondary | ICD-10-CM | POA: Diagnosis not present

## 2016-05-02 DIAGNOSIS — G252 Other specified forms of tremor: Secondary | ICD-10-CM | POA: Diagnosis not present

## 2016-05-02 DIAGNOSIS — G250 Essential tremor: Secondary | ICD-10-CM | POA: Diagnosis not present

## 2016-05-02 DIAGNOSIS — M1990 Unspecified osteoarthritis, unspecified site: Secondary | ICD-10-CM | POA: Diagnosis not present

## 2016-05-03 DIAGNOSIS — M1990 Unspecified osteoarthritis, unspecified site: Secondary | ICD-10-CM | POA: Diagnosis not present

## 2016-05-03 DIAGNOSIS — G250 Essential tremor: Secondary | ICD-10-CM | POA: Diagnosis not present

## 2016-05-03 DIAGNOSIS — G252 Other specified forms of tremor: Secondary | ICD-10-CM | POA: Diagnosis not present

## 2016-05-04 DIAGNOSIS — G252 Other specified forms of tremor: Secondary | ICD-10-CM | POA: Diagnosis not present

## 2016-05-04 DIAGNOSIS — G250 Essential tremor: Secondary | ICD-10-CM | POA: Diagnosis not present

## 2016-05-04 DIAGNOSIS — M1990 Unspecified osteoarthritis, unspecified site: Secondary | ICD-10-CM | POA: Diagnosis not present

## 2016-05-05 DIAGNOSIS — G250 Essential tremor: Secondary | ICD-10-CM | POA: Diagnosis not present

## 2016-05-05 DIAGNOSIS — G252 Other specified forms of tremor: Secondary | ICD-10-CM | POA: Diagnosis not present

## 2016-05-05 DIAGNOSIS — M1990 Unspecified osteoarthritis, unspecified site: Secondary | ICD-10-CM | POA: Diagnosis not present

## 2016-05-06 DIAGNOSIS — G250 Essential tremor: Secondary | ICD-10-CM | POA: Diagnosis not present

## 2016-05-06 DIAGNOSIS — M1990 Unspecified osteoarthritis, unspecified site: Secondary | ICD-10-CM | POA: Diagnosis not present

## 2016-05-06 DIAGNOSIS — G252 Other specified forms of tremor: Secondary | ICD-10-CM | POA: Diagnosis not present

## 2016-05-07 DIAGNOSIS — G252 Other specified forms of tremor: Secondary | ICD-10-CM | POA: Diagnosis not present

## 2016-05-07 DIAGNOSIS — G250 Essential tremor: Secondary | ICD-10-CM | POA: Diagnosis not present

## 2016-05-07 DIAGNOSIS — M1990 Unspecified osteoarthritis, unspecified site: Secondary | ICD-10-CM | POA: Diagnosis not present

## 2016-05-08 DIAGNOSIS — M1990 Unspecified osteoarthritis, unspecified site: Secondary | ICD-10-CM | POA: Diagnosis not present

## 2016-05-08 DIAGNOSIS — G250 Essential tremor: Secondary | ICD-10-CM | POA: Diagnosis not present

## 2016-05-08 DIAGNOSIS — G252 Other specified forms of tremor: Secondary | ICD-10-CM | POA: Diagnosis not present

## 2016-05-09 ENCOUNTER — Other Ambulatory Visit: Payer: Self-pay | Admitting: Internal Medicine

## 2016-05-09 DIAGNOSIS — G250 Essential tremor: Secondary | ICD-10-CM | POA: Diagnosis not present

## 2016-05-09 DIAGNOSIS — G252 Other specified forms of tremor: Secondary | ICD-10-CM | POA: Diagnosis not present

## 2016-05-09 DIAGNOSIS — M1990 Unspecified osteoarthritis, unspecified site: Secondary | ICD-10-CM | POA: Diagnosis not present

## 2016-05-10 DIAGNOSIS — M1990 Unspecified osteoarthritis, unspecified site: Secondary | ICD-10-CM | POA: Diagnosis not present

## 2016-05-10 DIAGNOSIS — G252 Other specified forms of tremor: Secondary | ICD-10-CM | POA: Diagnosis not present

## 2016-05-10 DIAGNOSIS — G250 Essential tremor: Secondary | ICD-10-CM | POA: Diagnosis not present

## 2016-05-11 DIAGNOSIS — M1990 Unspecified osteoarthritis, unspecified site: Secondary | ICD-10-CM | POA: Diagnosis not present

## 2016-05-11 DIAGNOSIS — G250 Essential tremor: Secondary | ICD-10-CM | POA: Diagnosis not present

## 2016-05-11 DIAGNOSIS — G252 Other specified forms of tremor: Secondary | ICD-10-CM | POA: Diagnosis not present

## 2016-05-12 DIAGNOSIS — G250 Essential tremor: Secondary | ICD-10-CM | POA: Diagnosis not present

## 2016-05-12 DIAGNOSIS — G252 Other specified forms of tremor: Secondary | ICD-10-CM | POA: Diagnosis not present

## 2016-05-12 DIAGNOSIS — M1990 Unspecified osteoarthritis, unspecified site: Secondary | ICD-10-CM | POA: Diagnosis not present

## 2016-05-13 DIAGNOSIS — M1990 Unspecified osteoarthritis, unspecified site: Secondary | ICD-10-CM | POA: Diagnosis not present

## 2016-05-13 DIAGNOSIS — G250 Essential tremor: Secondary | ICD-10-CM | POA: Diagnosis not present

## 2016-05-13 DIAGNOSIS — G252 Other specified forms of tremor: Secondary | ICD-10-CM | POA: Diagnosis not present

## 2016-05-14 DIAGNOSIS — M1990 Unspecified osteoarthritis, unspecified site: Secondary | ICD-10-CM | POA: Diagnosis not present

## 2016-05-14 DIAGNOSIS — G252 Other specified forms of tremor: Secondary | ICD-10-CM | POA: Diagnosis not present

## 2016-05-14 DIAGNOSIS — G250 Essential tremor: Secondary | ICD-10-CM | POA: Diagnosis not present

## 2016-05-16 DIAGNOSIS — G250 Essential tremor: Secondary | ICD-10-CM | POA: Diagnosis not present

## 2016-05-16 DIAGNOSIS — G252 Other specified forms of tremor: Secondary | ICD-10-CM | POA: Diagnosis not present

## 2016-05-16 DIAGNOSIS — M1990 Unspecified osteoarthritis, unspecified site: Secondary | ICD-10-CM | POA: Diagnosis not present

## 2016-05-17 DIAGNOSIS — G252 Other specified forms of tremor: Secondary | ICD-10-CM | POA: Diagnosis not present

## 2016-05-17 DIAGNOSIS — M1990 Unspecified osteoarthritis, unspecified site: Secondary | ICD-10-CM | POA: Diagnosis not present

## 2016-05-17 DIAGNOSIS — G250 Essential tremor: Secondary | ICD-10-CM | POA: Diagnosis not present

## 2016-05-18 DIAGNOSIS — G252 Other specified forms of tremor: Secondary | ICD-10-CM | POA: Diagnosis not present

## 2016-05-18 DIAGNOSIS — G250 Essential tremor: Secondary | ICD-10-CM | POA: Diagnosis not present

## 2016-05-18 DIAGNOSIS — M1990 Unspecified osteoarthritis, unspecified site: Secondary | ICD-10-CM | POA: Diagnosis not present

## 2016-05-19 DIAGNOSIS — G252 Other specified forms of tremor: Secondary | ICD-10-CM | POA: Diagnosis not present

## 2016-05-19 DIAGNOSIS — G250 Essential tremor: Secondary | ICD-10-CM | POA: Diagnosis not present

## 2016-05-19 DIAGNOSIS — M1990 Unspecified osteoarthritis, unspecified site: Secondary | ICD-10-CM | POA: Diagnosis not present

## 2016-05-20 DIAGNOSIS — M1990 Unspecified osteoarthritis, unspecified site: Secondary | ICD-10-CM | POA: Diagnosis not present

## 2016-05-20 DIAGNOSIS — G252 Other specified forms of tremor: Secondary | ICD-10-CM | POA: Diagnosis not present

## 2016-05-20 DIAGNOSIS — G250 Essential tremor: Secondary | ICD-10-CM | POA: Diagnosis not present

## 2016-05-21 DIAGNOSIS — G252 Other specified forms of tremor: Secondary | ICD-10-CM | POA: Diagnosis not present

## 2016-05-21 DIAGNOSIS — G250 Essential tremor: Secondary | ICD-10-CM | POA: Diagnosis not present

## 2016-05-21 DIAGNOSIS — M1990 Unspecified osteoarthritis, unspecified site: Secondary | ICD-10-CM | POA: Diagnosis not present

## 2016-05-22 DIAGNOSIS — M1990 Unspecified osteoarthritis, unspecified site: Secondary | ICD-10-CM | POA: Diagnosis not present

## 2016-05-22 DIAGNOSIS — G252 Other specified forms of tremor: Secondary | ICD-10-CM | POA: Diagnosis not present

## 2016-05-22 DIAGNOSIS — G250 Essential tremor: Secondary | ICD-10-CM | POA: Diagnosis not present

## 2016-05-23 DIAGNOSIS — M1990 Unspecified osteoarthritis, unspecified site: Secondary | ICD-10-CM | POA: Diagnosis not present

## 2016-05-23 DIAGNOSIS — G250 Essential tremor: Secondary | ICD-10-CM | POA: Diagnosis not present

## 2016-05-23 DIAGNOSIS — G252 Other specified forms of tremor: Secondary | ICD-10-CM | POA: Diagnosis not present

## 2016-05-24 DIAGNOSIS — G250 Essential tremor: Secondary | ICD-10-CM | POA: Diagnosis not present

## 2016-05-24 DIAGNOSIS — M1990 Unspecified osteoarthritis, unspecified site: Secondary | ICD-10-CM | POA: Diagnosis not present

## 2016-05-24 DIAGNOSIS — G252 Other specified forms of tremor: Secondary | ICD-10-CM | POA: Diagnosis not present

## 2016-05-25 DIAGNOSIS — M1990 Unspecified osteoarthritis, unspecified site: Secondary | ICD-10-CM | POA: Diagnosis not present

## 2016-05-25 DIAGNOSIS — G250 Essential tremor: Secondary | ICD-10-CM | POA: Diagnosis not present

## 2016-05-25 DIAGNOSIS — G252 Other specified forms of tremor: Secondary | ICD-10-CM | POA: Diagnosis not present

## 2016-05-26 DIAGNOSIS — M1990 Unspecified osteoarthritis, unspecified site: Secondary | ICD-10-CM | POA: Diagnosis not present

## 2016-05-26 DIAGNOSIS — G250 Essential tremor: Secondary | ICD-10-CM | POA: Diagnosis not present

## 2016-05-26 DIAGNOSIS — G252 Other specified forms of tremor: Secondary | ICD-10-CM | POA: Diagnosis not present

## 2016-05-27 DIAGNOSIS — H40013 Open angle with borderline findings, low risk, bilateral: Secondary | ICD-10-CM | POA: Diagnosis not present

## 2016-05-27 DIAGNOSIS — G250 Essential tremor: Secondary | ICD-10-CM | POA: Diagnosis not present

## 2016-05-27 DIAGNOSIS — H35033 Hypertensive retinopathy, bilateral: Secondary | ICD-10-CM | POA: Diagnosis not present

## 2016-05-27 DIAGNOSIS — M1990 Unspecified osteoarthritis, unspecified site: Secondary | ICD-10-CM | POA: Diagnosis not present

## 2016-05-27 DIAGNOSIS — E119 Type 2 diabetes mellitus without complications: Secondary | ICD-10-CM | POA: Diagnosis not present

## 2016-05-27 DIAGNOSIS — H2513 Age-related nuclear cataract, bilateral: Secondary | ICD-10-CM | POA: Diagnosis not present

## 2016-05-27 DIAGNOSIS — G252 Other specified forms of tremor: Secondary | ICD-10-CM | POA: Diagnosis not present

## 2016-05-27 LAB — HM DIABETES EYE EXAM

## 2016-05-28 DIAGNOSIS — M1990 Unspecified osteoarthritis, unspecified site: Secondary | ICD-10-CM | POA: Diagnosis not present

## 2016-05-28 DIAGNOSIS — G250 Essential tremor: Secondary | ICD-10-CM | POA: Diagnosis not present

## 2016-05-28 DIAGNOSIS — G252 Other specified forms of tremor: Secondary | ICD-10-CM | POA: Diagnosis not present

## 2016-05-29 DIAGNOSIS — M1990 Unspecified osteoarthritis, unspecified site: Secondary | ICD-10-CM | POA: Diagnosis not present

## 2016-05-29 DIAGNOSIS — G250 Essential tremor: Secondary | ICD-10-CM | POA: Diagnosis not present

## 2016-05-29 DIAGNOSIS — G252 Other specified forms of tremor: Secondary | ICD-10-CM | POA: Diagnosis not present

## 2016-05-30 DIAGNOSIS — G252 Other specified forms of tremor: Secondary | ICD-10-CM | POA: Diagnosis not present

## 2016-05-30 DIAGNOSIS — M1990 Unspecified osteoarthritis, unspecified site: Secondary | ICD-10-CM | POA: Diagnosis not present

## 2016-05-30 DIAGNOSIS — G250 Essential tremor: Secondary | ICD-10-CM | POA: Diagnosis not present

## 2016-05-31 DIAGNOSIS — G252 Other specified forms of tremor: Secondary | ICD-10-CM | POA: Diagnosis not present

## 2016-05-31 DIAGNOSIS — M1990 Unspecified osteoarthritis, unspecified site: Secondary | ICD-10-CM | POA: Diagnosis not present

## 2016-05-31 DIAGNOSIS — G250 Essential tremor: Secondary | ICD-10-CM | POA: Diagnosis not present

## 2016-06-01 DIAGNOSIS — G252 Other specified forms of tremor: Secondary | ICD-10-CM | POA: Diagnosis not present

## 2016-06-01 DIAGNOSIS — M1990 Unspecified osteoarthritis, unspecified site: Secondary | ICD-10-CM | POA: Diagnosis not present

## 2016-06-01 DIAGNOSIS — G250 Essential tremor: Secondary | ICD-10-CM | POA: Diagnosis not present

## 2016-06-02 ENCOUNTER — Telehealth: Payer: Self-pay

## 2016-06-02 DIAGNOSIS — M170 Bilateral primary osteoarthritis of knee: Secondary | ICD-10-CM

## 2016-06-02 DIAGNOSIS — G250 Essential tremor: Secondary | ICD-10-CM | POA: Diagnosis not present

## 2016-06-02 DIAGNOSIS — G252 Other specified forms of tremor: Secondary | ICD-10-CM | POA: Diagnosis not present

## 2016-06-02 DIAGNOSIS — M1990 Unspecified osteoarthritis, unspecified site: Secondary | ICD-10-CM | POA: Diagnosis not present

## 2016-06-02 NOTE — Telephone Encounter (Signed)
Pls sch appt with Daryll Drown - first available. DM F/U

## 2016-06-02 NOTE — Telephone Encounter (Signed)
Has scripts, pt informed

## 2016-06-02 NOTE — Telephone Encounter (Signed)
done

## 2016-06-02 NOTE — Telephone Encounter (Signed)
HYDROcodone-acetaminophen (NORCO) 7.5-325 MG tablet, refill request.  

## 2016-06-03 DIAGNOSIS — G252 Other specified forms of tremor: Secondary | ICD-10-CM | POA: Diagnosis not present

## 2016-06-03 DIAGNOSIS — G250 Essential tremor: Secondary | ICD-10-CM | POA: Diagnosis not present

## 2016-06-03 DIAGNOSIS — M1990 Unspecified osteoarthritis, unspecified site: Secondary | ICD-10-CM | POA: Diagnosis not present

## 2016-06-04 DIAGNOSIS — G252 Other specified forms of tremor: Secondary | ICD-10-CM | POA: Diagnosis not present

## 2016-06-04 DIAGNOSIS — G250 Essential tremor: Secondary | ICD-10-CM | POA: Diagnosis not present

## 2016-06-04 DIAGNOSIS — M1990 Unspecified osteoarthritis, unspecified site: Secondary | ICD-10-CM | POA: Diagnosis not present

## 2016-06-05 DIAGNOSIS — G250 Essential tremor: Secondary | ICD-10-CM | POA: Diagnosis not present

## 2016-06-05 DIAGNOSIS — M1990 Unspecified osteoarthritis, unspecified site: Secondary | ICD-10-CM | POA: Diagnosis not present

## 2016-06-05 DIAGNOSIS — G252 Other specified forms of tremor: Secondary | ICD-10-CM | POA: Diagnosis not present

## 2016-06-06 DIAGNOSIS — M1990 Unspecified osteoarthritis, unspecified site: Secondary | ICD-10-CM | POA: Diagnosis not present

## 2016-06-06 DIAGNOSIS — G252 Other specified forms of tremor: Secondary | ICD-10-CM | POA: Diagnosis not present

## 2016-06-06 DIAGNOSIS — G250 Essential tremor: Secondary | ICD-10-CM | POA: Diagnosis not present

## 2016-06-07 DIAGNOSIS — G250 Essential tremor: Secondary | ICD-10-CM | POA: Diagnosis not present

## 2016-06-07 DIAGNOSIS — M1990 Unspecified osteoarthritis, unspecified site: Secondary | ICD-10-CM | POA: Diagnosis not present

## 2016-06-07 DIAGNOSIS — G252 Other specified forms of tremor: Secondary | ICD-10-CM | POA: Diagnosis not present

## 2016-06-08 DIAGNOSIS — G250 Essential tremor: Secondary | ICD-10-CM | POA: Diagnosis not present

## 2016-06-08 DIAGNOSIS — M1990 Unspecified osteoarthritis, unspecified site: Secondary | ICD-10-CM | POA: Diagnosis not present

## 2016-06-08 DIAGNOSIS — G252 Other specified forms of tremor: Secondary | ICD-10-CM | POA: Diagnosis not present

## 2016-06-09 DIAGNOSIS — M1990 Unspecified osteoarthritis, unspecified site: Secondary | ICD-10-CM | POA: Diagnosis not present

## 2016-06-09 DIAGNOSIS — G250 Essential tremor: Secondary | ICD-10-CM | POA: Diagnosis not present

## 2016-06-09 DIAGNOSIS — G252 Other specified forms of tremor: Secondary | ICD-10-CM | POA: Diagnosis not present

## 2016-06-10 DIAGNOSIS — G250 Essential tremor: Secondary | ICD-10-CM | POA: Diagnosis not present

## 2016-06-10 DIAGNOSIS — G252 Other specified forms of tremor: Secondary | ICD-10-CM | POA: Diagnosis not present

## 2016-06-10 DIAGNOSIS — M1990 Unspecified osteoarthritis, unspecified site: Secondary | ICD-10-CM | POA: Diagnosis not present

## 2016-06-11 DIAGNOSIS — M1990 Unspecified osteoarthritis, unspecified site: Secondary | ICD-10-CM | POA: Diagnosis not present

## 2016-06-11 DIAGNOSIS — G250 Essential tremor: Secondary | ICD-10-CM | POA: Diagnosis not present

## 2016-06-11 DIAGNOSIS — G252 Other specified forms of tremor: Secondary | ICD-10-CM | POA: Diagnosis not present

## 2016-06-12 ENCOUNTER — Encounter: Payer: Self-pay | Admitting: Internal Medicine

## 2016-06-12 ENCOUNTER — Ambulatory Visit (INDEPENDENT_AMBULATORY_CARE_PROVIDER_SITE_OTHER): Payer: Commercial Managed Care - HMO | Admitting: Internal Medicine

## 2016-06-12 ENCOUNTER — Encounter (INDEPENDENT_AMBULATORY_CARE_PROVIDER_SITE_OTHER): Payer: Self-pay

## 2016-06-12 VITALS — BP 136/74 | HR 90 | Temp 97.8°F | Ht 73.0 in | Wt 208.5 lb

## 2016-06-12 DIAGNOSIS — E11319 Type 2 diabetes mellitus with unspecified diabetic retinopathy without macular edema: Secondary | ICD-10-CM | POA: Diagnosis not present

## 2016-06-12 DIAGNOSIS — E1165 Type 2 diabetes mellitus with hyperglycemia: Secondary | ICD-10-CM

## 2016-06-12 DIAGNOSIS — Z7984 Long term (current) use of oral hypoglycemic drugs: Secondary | ICD-10-CM

## 2016-06-12 DIAGNOSIS — I10 Essential (primary) hypertension: Secondary | ICD-10-CM | POA: Diagnosis not present

## 2016-06-12 DIAGNOSIS — G250 Essential tremor: Secondary | ICD-10-CM | POA: Diagnosis not present

## 2016-06-12 DIAGNOSIS — Z87891 Personal history of nicotine dependence: Secondary | ICD-10-CM

## 2016-06-12 DIAGNOSIS — H35039 Hypertensive retinopathy, unspecified eye: Secondary | ICD-10-CM

## 2016-06-12 DIAGNOSIS — E119 Type 2 diabetes mellitus without complications: Secondary | ICD-10-CM

## 2016-06-12 DIAGNOSIS — G252 Other specified forms of tremor: Secondary | ICD-10-CM | POA: Diagnosis not present

## 2016-06-12 DIAGNOSIS — M1990 Unspecified osteoarthritis, unspecified site: Secondary | ICD-10-CM | POA: Diagnosis not present

## 2016-06-12 DIAGNOSIS — Z79899 Other long term (current) drug therapy: Secondary | ICD-10-CM

## 2016-06-12 DIAGNOSIS — M25512 Pain in left shoulder: Secondary | ICD-10-CM

## 2016-06-12 LAB — POCT GLYCOSYLATED HEMOGLOBIN (HGB A1C): Hemoglobin A1C: 12.1

## 2016-06-12 LAB — GLUCOSE, CAPILLARY: Glucose-Capillary: 343 mg/dL — ABNORMAL HIGH (ref 65–99)

## 2016-06-12 MED ORDER — SITAGLIPTIN PHOSPHATE 100 MG PO TABS: 100.0000 mg | ORAL_TABLET | Freq: Every day | ORAL | 3 refills | Status: DC

## 2016-06-12 NOTE — Assessment & Plan Note (Signed)
He only took the [place] about 1-2 months.  He seemed very surprised that his DM was doing so poorly, despite a long discussion at last visit.  I think he has a better understanding now and will be motivated to improve his diet/lifestyle.   A1C 12.1 LDL 75 BP 136/74 Eye: Hypertensive retinopathy without edema Foot: Reports no numbness/tingling Renal: Stable, check at next visit.  He is on an ACE -I  Plan He was not interested in starting insulin today.  I advised him that after 3 months of diet and exercise if blood sugars are not significantly improved, we would need to start lantus insulin Restart Januvia RTC in 3 months Increase exercise, we discussed options.   He knows the diet he is supposed to have, consider MNT at next visit.

## 2016-06-12 NOTE — Assessment & Plan Note (Signed)
BP on recheck today was 136/74.  Based on eye exam he has hypertensive retinopathy so continued good control is warranted.   Plan Continue lisinopril, propranolol, diltiazem

## 2016-06-12 NOTE — Assessment & Plan Note (Signed)
He had an accident about a year ago and has intermittent shoulder pain.  This is not too bothersome to him.  He uses his vicoden with good relief.   Plan Continue current pain regimen. (no refills today) Monitor for changes.

## 2016-06-12 NOTE — Progress Notes (Signed)
   Subjective:    Patient ID: [name], male    DOB: 08/13/1950, 65 y.o.   MRN: [id number]  CC: 3 month follow up for DM  HPI   [name] is a 65yo man with PMH of DM2, HTN, HLD, essential tremor who presents for follow up of DM.   Over the past year, [name] has had a worsening of his DM.  He had a controlled A1C around 6.7, slowly increased to 7.7 and then was > 14.  He was not checking his blood sugars regularly.  When I last saw him, we started Januvia.  He reports he took that for about 1 month.  He brought in his BS log which shows elevated BS throughout the day. His morning BS range 250 to 400s.  He has only been taking metformin and glipizide.  He is out of the [place]. We discussed at length starting insulin and he would like to try eating more healthily and exercising again.   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  I am not very hopeful that his exercises will be able to improve.  He was previously walking many blocks a day.  Since he has started working, his activity has significantly decreased.    He continues to have intermittent shoulder pain in the left shoulder.  This is not a daily bother to him currently.   Review of Systems  Constitutional: Negative for chills, fatigue and fever.  Eyes: Negative for photophobia and visual disturbance.  Respiratory: Negative for cough and shortness of breath.   Cardiovascular: Negative for chest pain and leg swelling.  Endocrine: Negative for polydipsia, polyphagia and polyuria.  Genitourinary: Negative for difficulty urinating and enuresis.  Neurological: Negative for dizziness.       Objective:   Physical Exam  Constitutional: He is oriented to person, place, and time. He appears well-developed and well-nourished. No distress.  HENT:  Head: Normocephalic and atraumatic.  Eyes: Conjunctivae are normal. No scleral icterus.  Cardiovascular: Normal rate, regular rhythm and normal heart sounds.   No  murmur heard. Pulmonary/Chest: Effort normal and breath sounds normal. No respiratory distress.  Abdominal: Soft. Bowel sounds are normal. He exhibits no distension.  Musculoskeletal: He exhibits no edema or tenderness.  Neurological: He is alert and oriented to person, place, and time.  Psychiatric: He has a normal mood and affect. His behavior is normal.  Vitals reviewed.   No labs today.       Assessment & Plan:  RTC in 3 months.

## 2016-06-12 NOTE — Patient Instructions (Signed)
[name] - -   Your diabetes and blood sugar are higher than I would like to see them.  Please RESTART taking the Januvia 100mg  daily.    Please continue to check your blood sugar once a day.   I would like to see your A1C start coming down.  If we cannot get it below 9 by the next time I see you, we will likely need to start insulin.   Keep taking all your other medications as prescribed.   Please call with any questions.   Thank you!  Sitagliptin oral tablet What is this medicine? SITAGLIPTIN (sit a GLIP tin) helps to treat type 2 diabetes. It helps to control blood sugar. Treatment is combined with diet and exercise. COMMON BRAND NAME(S): Januvia What should I tell my health care provider before I take this medicine? They need to know if you have any of these conditions: -diabetic ketoacidosis -kidney disease -pancreatitis -previous swelling of the tongue, face, or lips with difficulty breathing, difficulty swallowing, hoarseness, or tightening of the throat -type 1 diabetes -an unusual or allergic reaction to sitagliptin, other medicines, foods, dyes, or preservatives -pregnant or trying to get pregnant -breast-feeding How should I use this medicine? Take this medicine by mouth with a glass of water. Follow the directions on the prescription label. You can take it with or without food. Do not cut, crush or chew this medicine. Take your dose at the same time each day. Do not take more often than directed. Do not stop taking except on your doctor's advice. Talk to your pediatrician regarding the use of this medicine in children. Special care may be needed. What if I miss a dose? If you miss a dose, take it as soon as you can. If it is almost time for your next dose, take only that dose. Do not take double or extra doses. What may interact with this medicine? Do not take this medicine with any of the following medications: -gatifloxacin This medicine may also interact with  the following medications: -alcohol -digoxin -insulin -sulfonylureas like glimepiride, glipizide, glyburide What should I watch for while using this medicine? Visit your doctor or health care professional for regular checks on your progress. A test called the HbA1C (A1C) will be monitored. This is a simple blood test. It measures your blood sugar control over the last 2 to 3 months. You will receive this test every 3 to 6 months. Learn how to check your blood sugar. Learn the symptoms of low and high blood sugar and how to manage them. Always carry a quick-source of sugar with you in case you have symptoms of low blood sugar. Examples include hard sugar candy or glucose tablets. Make sure others know that you can choke if you eat or drink when you develop serious symptoms of low blood sugar, such as seizures or unconsciousness. They must get medical help at once. Tell your doctor or health care professional if you have high blood sugar. You might need to change the dose of your medicine. If you are sick or exercising more than usual, you might need to change the dose of your medicine. Do not skip meals. Ask your doctor or health care professional if you should avoid alcohol. Many nonprescription cough and cold products contain sugar or alcohol. These can affect blood sugar. Wear a medical ID bracelet or chain, and carry a card that describes your disease and details of your medicine and dosage times. What side effects may I notice  from receiving this medicine? Side effects that you should report to your doctor or health care professional as soon as possible: -allergic reactions like skin rash, itching or hives, swelling of the face, lips, or tongue -breathing problems -fever, chills -joint pain -loss of appetite -signs and symptoms of low blood sugar such as feeling anxious, confusion, dizziness, increased hunger, unusually weak or tired, sweating, shakiness, cold, irritable, headache, blurred  vision, fast heartbeat, loss of consciousness -unusual stomach pain or discomfort -vomiting Side effects that usually do not require medical attention (report to your doctor or health care professional if they continue or are bothersome): -diarrhea -headache -sore throat -stomach upset -stuffy or runny nose Where should I keep my medicine? Keep out of the reach of children. Store at room temperature between 15 and 30 degrees C (59 and 86 degrees F). Throw away any unused medicine after the expiration date.  2017 Elsevier/Gold Standard (2014-02-17 17:46:21)

## 2016-06-13 DIAGNOSIS — G252 Other specified forms of tremor: Secondary | ICD-10-CM | POA: Diagnosis not present

## 2016-06-13 DIAGNOSIS — G250 Essential tremor: Secondary | ICD-10-CM | POA: Diagnosis not present

## 2016-06-13 DIAGNOSIS — M1990 Unspecified osteoarthritis, unspecified site: Secondary | ICD-10-CM | POA: Diagnosis not present

## 2016-06-14 DIAGNOSIS — G250 Essential tremor: Secondary | ICD-10-CM | POA: Diagnosis not present

## 2016-06-14 DIAGNOSIS — G252 Other specified forms of tremor: Secondary | ICD-10-CM | POA: Diagnosis not present

## 2016-06-14 DIAGNOSIS — M1990 Unspecified osteoarthritis, unspecified site: Secondary | ICD-10-CM | POA: Diagnosis not present

## 2016-06-15 DIAGNOSIS — G252 Other specified forms of tremor: Secondary | ICD-10-CM | POA: Diagnosis not present

## 2016-06-15 DIAGNOSIS — M1990 Unspecified osteoarthritis, unspecified site: Secondary | ICD-10-CM | POA: Diagnosis not present

## 2016-06-15 DIAGNOSIS — G250 Essential tremor: Secondary | ICD-10-CM | POA: Diagnosis not present

## 2016-06-17 DIAGNOSIS — M1990 Unspecified osteoarthritis, unspecified site: Secondary | ICD-10-CM | POA: Diagnosis not present

## 2016-06-17 DIAGNOSIS — G252 Other specified forms of tremor: Secondary | ICD-10-CM | POA: Diagnosis not present

## 2016-06-17 DIAGNOSIS — G250 Essential tremor: Secondary | ICD-10-CM | POA: Diagnosis not present

## 2016-06-18 DIAGNOSIS — L84 Corns and callosities: Secondary | ICD-10-CM | POA: Diagnosis not present

## 2016-06-18 DIAGNOSIS — G250 Essential tremor: Secondary | ICD-10-CM | POA: Diagnosis not present

## 2016-06-18 DIAGNOSIS — E1351 Other specified diabetes mellitus with diabetic peripheral angiopathy without gangrene: Secondary | ICD-10-CM | POA: Diagnosis not present

## 2016-06-18 DIAGNOSIS — M1990 Unspecified osteoarthritis, unspecified site: Secondary | ICD-10-CM | POA: Diagnosis not present

## 2016-06-18 DIAGNOSIS — G252 Other specified forms of tremor: Secondary | ICD-10-CM | POA: Diagnosis not present

## 2016-06-18 DIAGNOSIS — L602 Onychogryphosis: Secondary | ICD-10-CM | POA: Diagnosis not present

## 2016-06-19 DIAGNOSIS — G252 Other specified forms of tremor: Secondary | ICD-10-CM | POA: Diagnosis not present

## 2016-06-19 DIAGNOSIS — G250 Essential tremor: Secondary | ICD-10-CM | POA: Diagnosis not present

## 2016-06-19 DIAGNOSIS — M1990 Unspecified osteoarthritis, unspecified site: Secondary | ICD-10-CM | POA: Diagnosis not present

## 2016-06-20 ENCOUNTER — Other Ambulatory Visit: Payer: Self-pay | Admitting: Internal Medicine

## 2016-06-20 DIAGNOSIS — G250 Essential tremor: Secondary | ICD-10-CM | POA: Diagnosis not present

## 2016-06-20 DIAGNOSIS — G252 Other specified forms of tremor: Secondary | ICD-10-CM | POA: Diagnosis not present

## 2016-06-20 DIAGNOSIS — M1990 Unspecified osteoarthritis, unspecified site: Secondary | ICD-10-CM | POA: Diagnosis not present

## 2016-06-21 DIAGNOSIS — G250 Essential tremor: Secondary | ICD-10-CM | POA: Diagnosis not present

## 2016-06-21 DIAGNOSIS — G252 Other specified forms of tremor: Secondary | ICD-10-CM | POA: Diagnosis not present

## 2016-06-21 DIAGNOSIS — M1990 Unspecified osteoarthritis, unspecified site: Secondary | ICD-10-CM | POA: Diagnosis not present

## 2016-06-22 DIAGNOSIS — G252 Other specified forms of tremor: Secondary | ICD-10-CM | POA: Diagnosis not present

## 2016-06-22 DIAGNOSIS — M1990 Unspecified osteoarthritis, unspecified site: Secondary | ICD-10-CM | POA: Diagnosis not present

## 2016-06-22 DIAGNOSIS — G250 Essential tremor: Secondary | ICD-10-CM | POA: Diagnosis not present

## 2016-06-24 DIAGNOSIS — M1990 Unspecified osteoarthritis, unspecified site: Secondary | ICD-10-CM | POA: Diagnosis not present

## 2016-06-24 DIAGNOSIS — G250 Essential tremor: Secondary | ICD-10-CM | POA: Diagnosis not present

## 2016-06-24 DIAGNOSIS — G252 Other specified forms of tremor: Secondary | ICD-10-CM | POA: Diagnosis not present

## 2016-06-25 DIAGNOSIS — M1990 Unspecified osteoarthritis, unspecified site: Secondary | ICD-10-CM | POA: Diagnosis not present

## 2016-06-25 DIAGNOSIS — G252 Other specified forms of tremor: Secondary | ICD-10-CM | POA: Diagnosis not present

## 2016-06-25 DIAGNOSIS — G250 Essential tremor: Secondary | ICD-10-CM | POA: Diagnosis not present

## 2016-06-26 DIAGNOSIS — G250 Essential tremor: Secondary | ICD-10-CM | POA: Diagnosis not present

## 2016-06-26 DIAGNOSIS — M1990 Unspecified osteoarthritis, unspecified site: Secondary | ICD-10-CM | POA: Diagnosis not present

## 2016-06-26 DIAGNOSIS — G252 Other specified forms of tremor: Secondary | ICD-10-CM | POA: Diagnosis not present

## 2016-06-27 DIAGNOSIS — M1990 Unspecified osteoarthritis, unspecified site: Secondary | ICD-10-CM | POA: Diagnosis not present

## 2016-06-27 DIAGNOSIS — G250 Essential tremor: Secondary | ICD-10-CM | POA: Diagnosis not present

## 2016-06-27 DIAGNOSIS — G252 Other specified forms of tremor: Secondary | ICD-10-CM | POA: Diagnosis not present

## 2016-06-28 DIAGNOSIS — M1990 Unspecified osteoarthritis, unspecified site: Secondary | ICD-10-CM | POA: Diagnosis not present

## 2016-06-28 DIAGNOSIS — G250 Essential tremor: Secondary | ICD-10-CM | POA: Diagnosis not present

## 2016-06-28 DIAGNOSIS — G252 Other specified forms of tremor: Secondary | ICD-10-CM | POA: Diagnosis not present

## 2016-06-29 DIAGNOSIS — G252 Other specified forms of tremor: Secondary | ICD-10-CM | POA: Diagnosis not present

## 2016-06-29 DIAGNOSIS — G250 Essential tremor: Secondary | ICD-10-CM | POA: Diagnosis not present

## 2016-06-29 DIAGNOSIS — M1990 Unspecified osteoarthritis, unspecified site: Secondary | ICD-10-CM | POA: Diagnosis not present

## 2016-06-30 DIAGNOSIS — G250 Essential tremor: Secondary | ICD-10-CM | POA: Diagnosis not present

## 2016-06-30 DIAGNOSIS — G252 Other specified forms of tremor: Secondary | ICD-10-CM | POA: Diagnosis not present

## 2016-06-30 DIAGNOSIS — M1990 Unspecified osteoarthritis, unspecified site: Secondary | ICD-10-CM | POA: Diagnosis not present

## 2016-07-01 ENCOUNTER — Other Ambulatory Visit: Payer: Self-pay

## 2016-07-01 DIAGNOSIS — M1990 Unspecified osteoarthritis, unspecified site: Secondary | ICD-10-CM | POA: Diagnosis not present

## 2016-07-01 DIAGNOSIS — G252 Other specified forms of tremor: Secondary | ICD-10-CM | POA: Diagnosis not present

## 2016-07-01 DIAGNOSIS — M170 Bilateral primary osteoarthritis of knee: Secondary | ICD-10-CM

## 2016-07-01 DIAGNOSIS — G250 Essential tremor: Secondary | ICD-10-CM | POA: Diagnosis not present

## 2016-07-01 NOTE — Telephone Encounter (Signed)
HYDROcodone-acetaminophen (NORCO) 7.5-325 MG tablet, Refill request.

## 2016-07-02 DIAGNOSIS — G250 Essential tremor: Secondary | ICD-10-CM | POA: Diagnosis not present

## 2016-07-02 DIAGNOSIS — M1990 Unspecified osteoarthritis, unspecified site: Secondary | ICD-10-CM | POA: Diagnosis not present

## 2016-07-02 DIAGNOSIS — G252 Other specified forms of tremor: Secondary | ICD-10-CM | POA: Diagnosis not present

## 2016-07-03 DIAGNOSIS — G250 Essential tremor: Secondary | ICD-10-CM | POA: Diagnosis not present

## 2016-07-03 DIAGNOSIS — M1990 Unspecified osteoarthritis, unspecified site: Secondary | ICD-10-CM | POA: Diagnosis not present

## 2016-07-03 DIAGNOSIS — G252 Other specified forms of tremor: Secondary | ICD-10-CM | POA: Diagnosis not present

## 2016-07-03 NOTE — Telephone Encounter (Signed)
Spoke to patient & informed him that prescription is ready for pick up.

## 2016-07-03 NOTE — Telephone Encounter (Signed)
Pt is calling back. Needs to speak with a nurse.

## 2016-07-03 NOTE — Telephone Encounter (Signed)
Want to know if Rx is ready. Please call back.  

## 2016-07-04 ENCOUNTER — Other Ambulatory Visit: Payer: Self-pay | Admitting: Internal Medicine

## 2016-07-04 DIAGNOSIS — M1990 Unspecified osteoarthritis, unspecified site: Secondary | ICD-10-CM | POA: Diagnosis not present

## 2016-07-04 DIAGNOSIS — G252 Other specified forms of tremor: Secondary | ICD-10-CM | POA: Diagnosis not present

## 2016-07-04 DIAGNOSIS — G250 Essential tremor: Secondary | ICD-10-CM | POA: Diagnosis not present

## 2016-07-04 DIAGNOSIS — M170 Bilateral primary osteoarthritis of knee: Secondary | ICD-10-CM

## 2016-07-04 MED ORDER — HYDROCODONE-ACETAMINOPHEN 7.5-325 MG PO TABS: 1.0000 | ORAL_TABLET | Freq: Four times a day (QID) | ORAL | 0 refills | Status: DC | PRN

## 2016-07-05 DIAGNOSIS — G250 Essential tremor: Secondary | ICD-10-CM | POA: Diagnosis not present

## 2016-07-05 DIAGNOSIS — G252 Other specified forms of tremor: Secondary | ICD-10-CM | POA: Diagnosis not present

## 2016-07-05 DIAGNOSIS — M1990 Unspecified osteoarthritis, unspecified site: Secondary | ICD-10-CM | POA: Diagnosis not present

## 2016-07-06 DIAGNOSIS — G250 Essential tremor: Secondary | ICD-10-CM | POA: Diagnosis not present

## 2016-07-06 DIAGNOSIS — G252 Other specified forms of tremor: Secondary | ICD-10-CM | POA: Diagnosis not present

## 2016-07-06 DIAGNOSIS — M1990 Unspecified osteoarthritis, unspecified site: Secondary | ICD-10-CM | POA: Diagnosis not present

## 2016-07-08 DIAGNOSIS — G250 Essential tremor: Secondary | ICD-10-CM | POA: Diagnosis not present

## 2016-07-08 DIAGNOSIS — M1990 Unspecified osteoarthritis, unspecified site: Secondary | ICD-10-CM | POA: Diagnosis not present

## 2016-07-08 DIAGNOSIS — G252 Other specified forms of tremor: Secondary | ICD-10-CM | POA: Diagnosis not present

## 2016-07-09 DIAGNOSIS — G252 Other specified forms of tremor: Secondary | ICD-10-CM | POA: Diagnosis not present

## 2016-07-09 DIAGNOSIS — M1990 Unspecified osteoarthritis, unspecified site: Secondary | ICD-10-CM | POA: Diagnosis not present

## 2016-07-09 DIAGNOSIS — G250 Essential tremor: Secondary | ICD-10-CM | POA: Diagnosis not present

## 2016-07-10 DIAGNOSIS — G250 Essential tremor: Secondary | ICD-10-CM | POA: Diagnosis not present

## 2016-07-10 DIAGNOSIS — M1990 Unspecified osteoarthritis, unspecified site: Secondary | ICD-10-CM | POA: Diagnosis not present

## 2016-07-10 DIAGNOSIS — G252 Other specified forms of tremor: Secondary | ICD-10-CM | POA: Diagnosis not present

## 2016-07-11 DIAGNOSIS — M1990 Unspecified osteoarthritis, unspecified site: Secondary | ICD-10-CM | POA: Diagnosis not present

## 2016-07-11 DIAGNOSIS — G252 Other specified forms of tremor: Secondary | ICD-10-CM | POA: Diagnosis not present

## 2016-07-11 DIAGNOSIS — G250 Essential tremor: Secondary | ICD-10-CM | POA: Diagnosis not present

## 2016-07-12 DIAGNOSIS — G252 Other specified forms of tremor: Secondary | ICD-10-CM | POA: Diagnosis not present

## 2016-07-12 DIAGNOSIS — M1990 Unspecified osteoarthritis, unspecified site: Secondary | ICD-10-CM | POA: Diagnosis not present

## 2016-07-12 DIAGNOSIS — G250 Essential tremor: Secondary | ICD-10-CM | POA: Diagnosis not present

## 2016-07-13 DIAGNOSIS — G250 Essential tremor: Secondary | ICD-10-CM | POA: Diagnosis not present

## 2016-07-13 DIAGNOSIS — M1990 Unspecified osteoarthritis, unspecified site: Secondary | ICD-10-CM | POA: Diagnosis not present

## 2016-07-13 DIAGNOSIS — G252 Other specified forms of tremor: Secondary | ICD-10-CM | POA: Diagnosis not present

## 2016-07-14 DIAGNOSIS — M1990 Unspecified osteoarthritis, unspecified site: Secondary | ICD-10-CM | POA: Diagnosis not present

## 2016-07-14 DIAGNOSIS — G250 Essential tremor: Secondary | ICD-10-CM | POA: Diagnosis not present

## 2016-07-14 DIAGNOSIS — G252 Other specified forms of tremor: Secondary | ICD-10-CM | POA: Diagnosis not present

## 2016-07-15 DIAGNOSIS — G252 Other specified forms of tremor: Secondary | ICD-10-CM | POA: Diagnosis not present

## 2016-07-15 DIAGNOSIS — M1990 Unspecified osteoarthritis, unspecified site: Secondary | ICD-10-CM | POA: Diagnosis not present

## 2016-07-15 DIAGNOSIS — G250 Essential tremor: Secondary | ICD-10-CM | POA: Diagnosis not present

## 2016-07-16 DIAGNOSIS — M1990 Unspecified osteoarthritis, unspecified site: Secondary | ICD-10-CM | POA: Diagnosis not present

## 2016-07-16 DIAGNOSIS — G252 Other specified forms of tremor: Secondary | ICD-10-CM | POA: Diagnosis not present

## 2016-07-16 DIAGNOSIS — G250 Essential tremor: Secondary | ICD-10-CM | POA: Diagnosis not present

## 2016-07-17 DIAGNOSIS — G252 Other specified forms of tremor: Secondary | ICD-10-CM | POA: Diagnosis not present

## 2016-07-17 DIAGNOSIS — G250 Essential tremor: Secondary | ICD-10-CM | POA: Diagnosis not present

## 2016-07-17 DIAGNOSIS — M1990 Unspecified osteoarthritis, unspecified site: Secondary | ICD-10-CM | POA: Diagnosis not present

## 2016-07-18 DIAGNOSIS — M1990 Unspecified osteoarthritis, unspecified site: Secondary | ICD-10-CM | POA: Diagnosis not present

## 2016-07-18 DIAGNOSIS — G250 Essential tremor: Secondary | ICD-10-CM | POA: Diagnosis not present

## 2016-07-18 DIAGNOSIS — G252 Other specified forms of tremor: Secondary | ICD-10-CM | POA: Diagnosis not present

## 2016-07-19 DIAGNOSIS — G252 Other specified forms of tremor: Secondary | ICD-10-CM | POA: Diagnosis not present

## 2016-07-19 DIAGNOSIS — M1990 Unspecified osteoarthritis, unspecified site: Secondary | ICD-10-CM | POA: Diagnosis not present

## 2016-07-19 DIAGNOSIS — G250 Essential tremor: Secondary | ICD-10-CM | POA: Diagnosis not present

## 2016-07-20 ENCOUNTER — Other Ambulatory Visit: Payer: Self-pay | Admitting: Internal Medicine

## 2016-07-20 DIAGNOSIS — G250 Essential tremor: Secondary | ICD-10-CM | POA: Diagnosis not present

## 2016-07-20 DIAGNOSIS — M1990 Unspecified osteoarthritis, unspecified site: Secondary | ICD-10-CM | POA: Diagnosis not present

## 2016-07-20 DIAGNOSIS — G252 Other specified forms of tremor: Secondary | ICD-10-CM | POA: Diagnosis not present

## 2016-07-21 DIAGNOSIS — G252 Other specified forms of tremor: Secondary | ICD-10-CM | POA: Diagnosis not present

## 2016-07-21 DIAGNOSIS — G250 Essential tremor: Secondary | ICD-10-CM | POA: Diagnosis not present

## 2016-07-21 DIAGNOSIS — M1990 Unspecified osteoarthritis, unspecified site: Secondary | ICD-10-CM | POA: Diagnosis not present

## 2016-07-22 DIAGNOSIS — G250 Essential tremor: Secondary | ICD-10-CM | POA: Diagnosis not present

## 2016-07-22 DIAGNOSIS — G252 Other specified forms of tremor: Secondary | ICD-10-CM | POA: Diagnosis not present

## 2016-07-22 DIAGNOSIS — M1990 Unspecified osteoarthritis, unspecified site: Secondary | ICD-10-CM | POA: Diagnosis not present

## 2016-07-23 DIAGNOSIS — G250 Essential tremor: Secondary | ICD-10-CM | POA: Diagnosis not present

## 2016-07-23 DIAGNOSIS — M1990 Unspecified osteoarthritis, unspecified site: Secondary | ICD-10-CM | POA: Diagnosis not present

## 2016-07-23 DIAGNOSIS — G252 Other specified forms of tremor: Secondary | ICD-10-CM | POA: Diagnosis not present

## 2016-07-24 DIAGNOSIS — G250 Essential tremor: Secondary | ICD-10-CM | POA: Diagnosis not present

## 2016-07-24 DIAGNOSIS — M1990 Unspecified osteoarthritis, unspecified site: Secondary | ICD-10-CM | POA: Diagnosis not present

## 2016-07-24 DIAGNOSIS — G252 Other specified forms of tremor: Secondary | ICD-10-CM | POA: Diagnosis not present

## 2016-07-25 DIAGNOSIS — G252 Other specified forms of tremor: Secondary | ICD-10-CM | POA: Diagnosis not present

## 2016-07-25 DIAGNOSIS — M1990 Unspecified osteoarthritis, unspecified site: Secondary | ICD-10-CM | POA: Diagnosis not present

## 2016-07-25 DIAGNOSIS — G250 Essential tremor: Secondary | ICD-10-CM | POA: Diagnosis not present

## 2016-07-26 DIAGNOSIS — G252 Other specified forms of tremor: Secondary | ICD-10-CM | POA: Diagnosis not present

## 2016-07-26 DIAGNOSIS — M1990 Unspecified osteoarthritis, unspecified site: Secondary | ICD-10-CM | POA: Diagnosis not present

## 2016-07-26 DIAGNOSIS — G250 Essential tremor: Secondary | ICD-10-CM | POA: Diagnosis not present

## 2016-07-27 DIAGNOSIS — M1990 Unspecified osteoarthritis, unspecified site: Secondary | ICD-10-CM | POA: Diagnosis not present

## 2016-07-27 DIAGNOSIS — G250 Essential tremor: Secondary | ICD-10-CM | POA: Diagnosis not present

## 2016-07-27 DIAGNOSIS — G252 Other specified forms of tremor: Secondary | ICD-10-CM | POA: Diagnosis not present

## 2016-07-28 DIAGNOSIS — G252 Other specified forms of tremor: Secondary | ICD-10-CM | POA: Diagnosis not present

## 2016-07-28 DIAGNOSIS — G250 Essential tremor: Secondary | ICD-10-CM | POA: Diagnosis not present

## 2016-07-28 DIAGNOSIS — M1990 Unspecified osteoarthritis, unspecified site: Secondary | ICD-10-CM | POA: Diagnosis not present

## 2016-07-29 DIAGNOSIS — M1990 Unspecified osteoarthritis, unspecified site: Secondary | ICD-10-CM | POA: Diagnosis not present

## 2016-07-29 DIAGNOSIS — G252 Other specified forms of tremor: Secondary | ICD-10-CM | POA: Diagnosis not present

## 2016-07-29 DIAGNOSIS — G250 Essential tremor: Secondary | ICD-10-CM | POA: Diagnosis not present

## 2016-07-30 DIAGNOSIS — G252 Other specified forms of tremor: Secondary | ICD-10-CM | POA: Diagnosis not present

## 2016-07-30 DIAGNOSIS — M1990 Unspecified osteoarthritis, unspecified site: Secondary | ICD-10-CM | POA: Diagnosis not present

## 2016-07-30 DIAGNOSIS — G250 Essential tremor: Secondary | ICD-10-CM | POA: Diagnosis not present

## 2016-07-31 DIAGNOSIS — M1990 Unspecified osteoarthritis, unspecified site: Secondary | ICD-10-CM | POA: Diagnosis not present

## 2016-07-31 DIAGNOSIS — G250 Essential tremor: Secondary | ICD-10-CM | POA: Diagnosis not present

## 2016-07-31 DIAGNOSIS — G252 Other specified forms of tremor: Secondary | ICD-10-CM | POA: Diagnosis not present

## 2016-08-01 ENCOUNTER — Other Ambulatory Visit: Payer: Self-pay

## 2016-08-01 DIAGNOSIS — G250 Essential tremor: Secondary | ICD-10-CM | POA: Diagnosis not present

## 2016-08-01 DIAGNOSIS — G252 Other specified forms of tremor: Secondary | ICD-10-CM | POA: Diagnosis not present

## 2016-08-01 DIAGNOSIS — M1990 Unspecified osteoarthritis, unspecified site: Secondary | ICD-10-CM | POA: Diagnosis not present

## 2016-08-01 DIAGNOSIS — M170 Bilateral primary osteoarthritis of knee: Secondary | ICD-10-CM

## 2016-08-01 NOTE — Telephone Encounter (Signed)
HYDROcodone-acetaminophen (NORCO) 7.5-325 MG tablet, refill request.  

## 2016-08-02 DIAGNOSIS — M1990 Unspecified osteoarthritis, unspecified site: Secondary | ICD-10-CM | POA: Diagnosis not present

## 2016-08-02 DIAGNOSIS — G252 Other specified forms of tremor: Secondary | ICD-10-CM | POA: Diagnosis not present

## 2016-08-02 DIAGNOSIS — G250 Essential tremor: Secondary | ICD-10-CM | POA: Diagnosis not present

## 2016-08-03 DIAGNOSIS — G250 Essential tremor: Secondary | ICD-10-CM | POA: Diagnosis not present

## 2016-08-03 DIAGNOSIS — G252 Other specified forms of tremor: Secondary | ICD-10-CM | POA: Diagnosis not present

## 2016-08-03 DIAGNOSIS — M1990 Unspecified osteoarthritis, unspecified site: Secondary | ICD-10-CM | POA: Diagnosis not present

## 2016-08-04 DIAGNOSIS — G250 Essential tremor: Secondary | ICD-10-CM | POA: Diagnosis not present

## 2016-08-04 DIAGNOSIS — M1990 Unspecified osteoarthritis, unspecified site: Secondary | ICD-10-CM | POA: Diagnosis not present

## 2016-08-04 DIAGNOSIS — G252 Other specified forms of tremor: Secondary | ICD-10-CM | POA: Diagnosis not present

## 2016-08-04 NOTE — Telephone Encounter (Signed)
Want to know if Rx is ready. Please call back.  

## 2016-08-05 DIAGNOSIS — G250 Essential tremor: Secondary | ICD-10-CM | POA: Diagnosis not present

## 2016-08-05 DIAGNOSIS — G252 Other specified forms of tremor: Secondary | ICD-10-CM | POA: Diagnosis not present

## 2016-08-05 DIAGNOSIS — M1990 Unspecified osteoarthritis, unspecified site: Secondary | ICD-10-CM | POA: Diagnosis not present

## 2016-08-05 NOTE — Telephone Encounter (Signed)
He was given 3 Rx on 07/04/16.  He should have a fill for February and March.  Refill not appropriate.  Can you ask him what happened to his paper copies?  If lost, per medication contract, these will not be re-done.    Thanks  EBM

## 2016-08-05 NOTE — Telephone Encounter (Signed)
Called patient to inform Rx is ready.

## 2016-08-06 DIAGNOSIS — G250 Essential tremor: Secondary | ICD-10-CM | POA: Diagnosis not present

## 2016-08-06 DIAGNOSIS — G252 Other specified forms of tremor: Secondary | ICD-10-CM | POA: Diagnosis not present

## 2016-08-06 DIAGNOSIS — M1990 Unspecified osteoarthritis, unspecified site: Secondary | ICD-10-CM | POA: Diagnosis not present

## 2016-08-06 NOTE — Telephone Encounter (Signed)
Mr gausman did not pick up scripts from last time, gave him 3 today- feb, mar, April, told him to leave all at pharmacy, he states he has a hard time keeping up with them

## 2016-08-07 DIAGNOSIS — G252 Other specified forms of tremor: Secondary | ICD-10-CM | POA: Diagnosis not present

## 2016-08-07 DIAGNOSIS — M1990 Unspecified osteoarthritis, unspecified site: Secondary | ICD-10-CM | POA: Diagnosis not present

## 2016-08-07 DIAGNOSIS — G250 Essential tremor: Secondary | ICD-10-CM | POA: Diagnosis not present

## 2016-08-08 DIAGNOSIS — G252 Other specified forms of tremor: Secondary | ICD-10-CM | POA: Diagnosis not present

## 2016-08-08 DIAGNOSIS — G250 Essential tremor: Secondary | ICD-10-CM | POA: Diagnosis not present

## 2016-08-08 DIAGNOSIS — M1990 Unspecified osteoarthritis, unspecified site: Secondary | ICD-10-CM | POA: Diagnosis not present

## 2016-08-09 DIAGNOSIS — M1990 Unspecified osteoarthritis, unspecified site: Secondary | ICD-10-CM | POA: Diagnosis not present

## 2016-08-09 DIAGNOSIS — G252 Other specified forms of tremor: Secondary | ICD-10-CM | POA: Diagnosis not present

## 2016-08-09 DIAGNOSIS — G250 Essential tremor: Secondary | ICD-10-CM | POA: Diagnosis not present

## 2016-08-10 DIAGNOSIS — G250 Essential tremor: Secondary | ICD-10-CM | POA: Diagnosis not present

## 2016-08-10 DIAGNOSIS — M1990 Unspecified osteoarthritis, unspecified site: Secondary | ICD-10-CM | POA: Diagnosis not present

## 2016-08-10 DIAGNOSIS — G252 Other specified forms of tremor: Secondary | ICD-10-CM | POA: Diagnosis not present

## 2016-08-11 DIAGNOSIS — M1990 Unspecified osteoarthritis, unspecified site: Secondary | ICD-10-CM | POA: Diagnosis not present

## 2016-08-11 DIAGNOSIS — G250 Essential tremor: Secondary | ICD-10-CM | POA: Diagnosis not present

## 2016-08-11 DIAGNOSIS — G252 Other specified forms of tremor: Secondary | ICD-10-CM | POA: Diagnosis not present

## 2016-08-12 DIAGNOSIS — G250 Essential tremor: Secondary | ICD-10-CM | POA: Diagnosis not present

## 2016-08-12 DIAGNOSIS — M1990 Unspecified osteoarthritis, unspecified site: Secondary | ICD-10-CM | POA: Diagnosis not present

## 2016-08-12 DIAGNOSIS — G252 Other specified forms of tremor: Secondary | ICD-10-CM | POA: Diagnosis not present

## 2016-08-13 DIAGNOSIS — G252 Other specified forms of tremor: Secondary | ICD-10-CM | POA: Diagnosis not present

## 2016-08-13 DIAGNOSIS — G250 Essential tremor: Secondary | ICD-10-CM | POA: Diagnosis not present

## 2016-08-13 DIAGNOSIS — M1990 Unspecified osteoarthritis, unspecified site: Secondary | ICD-10-CM | POA: Diagnosis not present

## 2016-08-14 DIAGNOSIS — M1990 Unspecified osteoarthritis, unspecified site: Secondary | ICD-10-CM | POA: Diagnosis not present

## 2016-08-14 DIAGNOSIS — G250 Essential tremor: Secondary | ICD-10-CM | POA: Diagnosis not present

## 2016-08-14 DIAGNOSIS — G252 Other specified forms of tremor: Secondary | ICD-10-CM | POA: Diagnosis not present

## 2016-08-15 DIAGNOSIS — M1990 Unspecified osteoarthritis, unspecified site: Secondary | ICD-10-CM | POA: Diagnosis not present

## 2016-08-15 DIAGNOSIS — G252 Other specified forms of tremor: Secondary | ICD-10-CM | POA: Diagnosis not present

## 2016-08-15 DIAGNOSIS — G250 Essential tremor: Secondary | ICD-10-CM | POA: Diagnosis not present

## 2016-08-16 ENCOUNTER — Other Ambulatory Visit: Payer: Self-pay | Admitting: Internal Medicine

## 2016-08-16 DIAGNOSIS — M1990 Unspecified osteoarthritis, unspecified site: Secondary | ICD-10-CM | POA: Diagnosis not present

## 2016-08-16 DIAGNOSIS — G250 Essential tremor: Secondary | ICD-10-CM | POA: Diagnosis not present

## 2016-08-16 DIAGNOSIS — G252 Other specified forms of tremor: Secondary | ICD-10-CM | POA: Diagnosis not present

## 2016-08-17 DIAGNOSIS — G250 Essential tremor: Secondary | ICD-10-CM | POA: Diagnosis not present

## 2016-08-17 DIAGNOSIS — G252 Other specified forms of tremor: Secondary | ICD-10-CM | POA: Diagnosis not present

## 2016-08-17 DIAGNOSIS — M1990 Unspecified osteoarthritis, unspecified site: Secondary | ICD-10-CM | POA: Diagnosis not present

## 2016-08-18 DIAGNOSIS — G250 Essential tremor: Secondary | ICD-10-CM | POA: Diagnosis not present

## 2016-08-18 DIAGNOSIS — G252 Other specified forms of tremor: Secondary | ICD-10-CM | POA: Diagnosis not present

## 2016-08-18 DIAGNOSIS — M1990 Unspecified osteoarthritis, unspecified site: Secondary | ICD-10-CM | POA: Diagnosis not present

## 2016-08-19 DIAGNOSIS — G250 Essential tremor: Secondary | ICD-10-CM | POA: Diagnosis not present

## 2016-08-19 DIAGNOSIS — G252 Other specified forms of tremor: Secondary | ICD-10-CM | POA: Diagnosis not present

## 2016-08-19 DIAGNOSIS — M1990 Unspecified osteoarthritis, unspecified site: Secondary | ICD-10-CM | POA: Diagnosis not present

## 2016-08-20 DIAGNOSIS — M1990 Unspecified osteoarthritis, unspecified site: Secondary | ICD-10-CM | POA: Diagnosis not present

## 2016-08-20 DIAGNOSIS — G250 Essential tremor: Secondary | ICD-10-CM | POA: Diagnosis not present

## 2016-08-20 DIAGNOSIS — G252 Other specified forms of tremor: Secondary | ICD-10-CM | POA: Diagnosis not present

## 2016-08-21 DIAGNOSIS — G252 Other specified forms of tremor: Secondary | ICD-10-CM | POA: Diagnosis not present

## 2016-08-21 DIAGNOSIS — M1990 Unspecified osteoarthritis, unspecified site: Secondary | ICD-10-CM | POA: Diagnosis not present

## 2016-08-21 DIAGNOSIS — G250 Essential tremor: Secondary | ICD-10-CM | POA: Diagnosis not present

## 2016-08-22 DIAGNOSIS — G250 Essential tremor: Secondary | ICD-10-CM | POA: Diagnosis not present

## 2016-08-22 DIAGNOSIS — G252 Other specified forms of tremor: Secondary | ICD-10-CM | POA: Diagnosis not present

## 2016-08-22 DIAGNOSIS — M1990 Unspecified osteoarthritis, unspecified site: Secondary | ICD-10-CM | POA: Diagnosis not present

## 2016-08-23 DIAGNOSIS — M1990 Unspecified osteoarthritis, unspecified site: Secondary | ICD-10-CM | POA: Diagnosis not present

## 2016-08-23 DIAGNOSIS — G250 Essential tremor: Secondary | ICD-10-CM | POA: Diagnosis not present

## 2016-08-23 DIAGNOSIS — G252 Other specified forms of tremor: Secondary | ICD-10-CM | POA: Diagnosis not present

## 2016-08-24 DIAGNOSIS — M1990 Unspecified osteoarthritis, unspecified site: Secondary | ICD-10-CM | POA: Diagnosis not present

## 2016-08-24 DIAGNOSIS — G250 Essential tremor: Secondary | ICD-10-CM | POA: Diagnosis not present

## 2016-08-24 DIAGNOSIS — G252 Other specified forms of tremor: Secondary | ICD-10-CM | POA: Diagnosis not present

## 2016-08-25 DIAGNOSIS — G252 Other specified forms of tremor: Secondary | ICD-10-CM | POA: Diagnosis not present

## 2016-08-25 DIAGNOSIS — G250 Essential tremor: Secondary | ICD-10-CM | POA: Diagnosis not present

## 2016-08-25 DIAGNOSIS — M1990 Unspecified osteoarthritis, unspecified site: Secondary | ICD-10-CM | POA: Diagnosis not present

## 2016-08-26 DIAGNOSIS — G250 Essential tremor: Secondary | ICD-10-CM | POA: Diagnosis not present

## 2016-08-26 DIAGNOSIS — M1990 Unspecified osteoarthritis, unspecified site: Secondary | ICD-10-CM | POA: Diagnosis not present

## 2016-08-26 DIAGNOSIS — G252 Other specified forms of tremor: Secondary | ICD-10-CM | POA: Diagnosis not present

## 2016-08-27 DIAGNOSIS — M1990 Unspecified osteoarthritis, unspecified site: Secondary | ICD-10-CM | POA: Diagnosis not present

## 2016-08-27 DIAGNOSIS — G252 Other specified forms of tremor: Secondary | ICD-10-CM | POA: Diagnosis not present

## 2016-08-27 DIAGNOSIS — G250 Essential tremor: Secondary | ICD-10-CM | POA: Diagnosis not present

## 2016-08-28 DIAGNOSIS — G250 Essential tremor: Secondary | ICD-10-CM | POA: Diagnosis not present

## 2016-08-28 DIAGNOSIS — G252 Other specified forms of tremor: Secondary | ICD-10-CM | POA: Diagnosis not present

## 2016-08-28 DIAGNOSIS — M1990 Unspecified osteoarthritis, unspecified site: Secondary | ICD-10-CM | POA: Diagnosis not present

## 2016-08-29 DIAGNOSIS — G252 Other specified forms of tremor: Secondary | ICD-10-CM | POA: Diagnosis not present

## 2016-08-29 DIAGNOSIS — G250 Essential tremor: Secondary | ICD-10-CM | POA: Diagnosis not present

## 2016-08-29 DIAGNOSIS — M1990 Unspecified osteoarthritis, unspecified site: Secondary | ICD-10-CM | POA: Diagnosis not present

## 2016-08-30 DIAGNOSIS — G250 Essential tremor: Secondary | ICD-10-CM | POA: Diagnosis not present

## 2016-08-30 DIAGNOSIS — M1990 Unspecified osteoarthritis, unspecified site: Secondary | ICD-10-CM | POA: Diagnosis not present

## 2016-08-30 DIAGNOSIS — G252 Other specified forms of tremor: Secondary | ICD-10-CM | POA: Diagnosis not present

## 2016-08-31 DIAGNOSIS — M1990 Unspecified osteoarthritis, unspecified site: Secondary | ICD-10-CM | POA: Diagnosis not present

## 2016-08-31 DIAGNOSIS — G252 Other specified forms of tremor: Secondary | ICD-10-CM | POA: Diagnosis not present

## 2016-08-31 DIAGNOSIS — G250 Essential tremor: Secondary | ICD-10-CM | POA: Diagnosis not present

## 2016-09-01 DIAGNOSIS — M1990 Unspecified osteoarthritis, unspecified site: Secondary | ICD-10-CM | POA: Diagnosis not present

## 2016-09-01 DIAGNOSIS — G250 Essential tremor: Secondary | ICD-10-CM | POA: Diagnosis not present

## 2016-09-01 DIAGNOSIS — G252 Other specified forms of tremor: Secondary | ICD-10-CM | POA: Diagnosis not present

## 2016-09-02 DIAGNOSIS — G250 Essential tremor: Secondary | ICD-10-CM | POA: Diagnosis not present

## 2016-09-02 DIAGNOSIS — G252 Other specified forms of tremor: Secondary | ICD-10-CM | POA: Diagnosis not present

## 2016-09-02 DIAGNOSIS — M1990 Unspecified osteoarthritis, unspecified site: Secondary | ICD-10-CM | POA: Diagnosis not present

## 2016-09-03 DIAGNOSIS — M1990 Unspecified osteoarthritis, unspecified site: Secondary | ICD-10-CM | POA: Diagnosis not present

## 2016-09-03 DIAGNOSIS — G250 Essential tremor: Secondary | ICD-10-CM | POA: Diagnosis not present

## 2016-09-03 DIAGNOSIS — G252 Other specified forms of tremor: Secondary | ICD-10-CM | POA: Diagnosis not present

## 2016-09-04 DIAGNOSIS — G250 Essential tremor: Secondary | ICD-10-CM | POA: Diagnosis not present

## 2016-09-04 DIAGNOSIS — G252 Other specified forms of tremor: Secondary | ICD-10-CM | POA: Diagnosis not present

## 2016-09-04 DIAGNOSIS — M1990 Unspecified osteoarthritis, unspecified site: Secondary | ICD-10-CM | POA: Diagnosis not present

## 2016-09-05 DIAGNOSIS — G252 Other specified forms of tremor: Secondary | ICD-10-CM | POA: Diagnosis not present

## 2016-09-05 DIAGNOSIS — G250 Essential tremor: Secondary | ICD-10-CM | POA: Diagnosis not present

## 2016-09-05 DIAGNOSIS — M1990 Unspecified osteoarthritis, unspecified site: Secondary | ICD-10-CM | POA: Diagnosis not present

## 2016-09-06 DIAGNOSIS — G252 Other specified forms of tremor: Secondary | ICD-10-CM | POA: Diagnosis not present

## 2016-09-06 DIAGNOSIS — G250 Essential tremor: Secondary | ICD-10-CM | POA: Diagnosis not present

## 2016-09-06 DIAGNOSIS — M1990 Unspecified osteoarthritis, unspecified site: Secondary | ICD-10-CM | POA: Diagnosis not present

## 2016-09-07 DIAGNOSIS — G252 Other specified forms of tremor: Secondary | ICD-10-CM | POA: Diagnosis not present

## 2016-09-07 DIAGNOSIS — M1990 Unspecified osteoarthritis, unspecified site: Secondary | ICD-10-CM | POA: Diagnosis not present

## 2016-09-07 DIAGNOSIS — G250 Essential tremor: Secondary | ICD-10-CM | POA: Diagnosis not present

## 2016-09-08 DIAGNOSIS — G250 Essential tremor: Secondary | ICD-10-CM | POA: Diagnosis not present

## 2016-09-08 DIAGNOSIS — M1990 Unspecified osteoarthritis, unspecified site: Secondary | ICD-10-CM | POA: Diagnosis not present

## 2016-09-08 DIAGNOSIS — G252 Other specified forms of tremor: Secondary | ICD-10-CM | POA: Diagnosis not present

## 2016-09-09 DIAGNOSIS — M1990 Unspecified osteoarthritis, unspecified site: Secondary | ICD-10-CM | POA: Diagnosis not present

## 2016-09-09 DIAGNOSIS — G250 Essential tremor: Secondary | ICD-10-CM | POA: Diagnosis not present

## 2016-09-09 DIAGNOSIS — G252 Other specified forms of tremor: Secondary | ICD-10-CM | POA: Diagnosis not present

## 2016-09-10 DIAGNOSIS — G250 Essential tremor: Secondary | ICD-10-CM | POA: Diagnosis not present

## 2016-09-10 DIAGNOSIS — G252 Other specified forms of tremor: Secondary | ICD-10-CM | POA: Diagnosis not present

## 2016-09-10 DIAGNOSIS — M1990 Unspecified osteoarthritis, unspecified site: Secondary | ICD-10-CM | POA: Diagnosis not present

## 2016-09-11 DIAGNOSIS — G250 Essential tremor: Secondary | ICD-10-CM | POA: Diagnosis not present

## 2016-09-11 DIAGNOSIS — G252 Other specified forms of tremor: Secondary | ICD-10-CM | POA: Diagnosis not present

## 2016-09-11 DIAGNOSIS — M1990 Unspecified osteoarthritis, unspecified site: Secondary | ICD-10-CM | POA: Diagnosis not present

## 2016-09-12 DIAGNOSIS — M1990 Unspecified osteoarthritis, unspecified site: Secondary | ICD-10-CM | POA: Diagnosis not present

## 2016-09-12 DIAGNOSIS — G252 Other specified forms of tremor: Secondary | ICD-10-CM | POA: Diagnosis not present

## 2016-09-12 DIAGNOSIS — G250 Essential tremor: Secondary | ICD-10-CM | POA: Diagnosis not present

## 2016-09-13 DIAGNOSIS — G250 Essential tremor: Secondary | ICD-10-CM | POA: Diagnosis not present

## 2016-09-13 DIAGNOSIS — G252 Other specified forms of tremor: Secondary | ICD-10-CM | POA: Diagnosis not present

## 2016-09-13 DIAGNOSIS — M1990 Unspecified osteoarthritis, unspecified site: Secondary | ICD-10-CM | POA: Diagnosis not present

## 2016-09-14 DIAGNOSIS — M1990 Unspecified osteoarthritis, unspecified site: Secondary | ICD-10-CM | POA: Diagnosis not present

## 2016-09-14 DIAGNOSIS — G250 Essential tremor: Secondary | ICD-10-CM | POA: Diagnosis not present

## 2016-09-14 DIAGNOSIS — G252 Other specified forms of tremor: Secondary | ICD-10-CM | POA: Diagnosis not present

## 2016-09-15 DIAGNOSIS — G250 Essential tremor: Secondary | ICD-10-CM | POA: Diagnosis not present

## 2016-09-15 DIAGNOSIS — G252 Other specified forms of tremor: Secondary | ICD-10-CM | POA: Diagnosis not present

## 2016-09-15 DIAGNOSIS — M1990 Unspecified osteoarthritis, unspecified site: Secondary | ICD-10-CM | POA: Diagnosis not present

## 2016-09-16 DIAGNOSIS — M1990 Unspecified osteoarthritis, unspecified site: Secondary | ICD-10-CM | POA: Diagnosis not present

## 2016-09-16 DIAGNOSIS — G252 Other specified forms of tremor: Secondary | ICD-10-CM | POA: Diagnosis not present

## 2016-09-16 DIAGNOSIS — G250 Essential tremor: Secondary | ICD-10-CM | POA: Diagnosis not present

## 2016-09-17 ENCOUNTER — Encounter (INDEPENDENT_AMBULATORY_CARE_PROVIDER_SITE_OTHER): Payer: Self-pay

## 2016-09-17 ENCOUNTER — Ambulatory Visit (INDEPENDENT_AMBULATORY_CARE_PROVIDER_SITE_OTHER): Payer: Medicare HMO | Admitting: Internal Medicine

## 2016-09-17 ENCOUNTER — Encounter: Payer: Self-pay | Admitting: Internal Medicine

## 2016-09-17 VITALS — BP 130/86 | HR 96 | Temp 97.5°F | Ht 73.0 in | Wt 209.9 lb

## 2016-09-17 DIAGNOSIS — J449 Chronic obstructive pulmonary disease, unspecified: Secondary | ICD-10-CM

## 2016-09-17 DIAGNOSIS — E785 Hyperlipidemia, unspecified: Secondary | ICD-10-CM | POA: Diagnosis not present

## 2016-09-17 DIAGNOSIS — Z79899 Other long term (current) drug therapy: Secondary | ICD-10-CM | POA: Diagnosis not present

## 2016-09-17 DIAGNOSIS — M25511 Pain in right shoulder: Secondary | ICD-10-CM | POA: Diagnosis not present

## 2016-09-17 DIAGNOSIS — E1351 Other specified diabetes mellitus with diabetic peripheral angiopathy without gangrene: Secondary | ICD-10-CM | POA: Diagnosis not present

## 2016-09-17 DIAGNOSIS — Z7984 Long term (current) use of oral hypoglycemic drugs: Secondary | ICD-10-CM | POA: Diagnosis not present

## 2016-09-17 DIAGNOSIS — E119 Type 2 diabetes mellitus without complications: Secondary | ICD-10-CM | POA: Diagnosis not present

## 2016-09-17 DIAGNOSIS — G250 Essential tremor: Secondary | ICD-10-CM | POA: Diagnosis not present

## 2016-09-17 DIAGNOSIS — Z7951 Long term (current) use of inhaled steroids: Secondary | ICD-10-CM

## 2016-09-17 DIAGNOSIS — I700 Atherosclerosis of aorta: Secondary | ICD-10-CM

## 2016-09-17 DIAGNOSIS — Z87891 Personal history of nicotine dependence: Secondary | ICD-10-CM | POA: Diagnosis not present

## 2016-09-17 DIAGNOSIS — M1990 Unspecified osteoarthritis, unspecified site: Secondary | ICD-10-CM | POA: Diagnosis not present

## 2016-09-17 DIAGNOSIS — E7800 Pure hypercholesterolemia, unspecified: Secondary | ICD-10-CM

## 2016-09-17 DIAGNOSIS — L602 Onychogryphosis: Secondary | ICD-10-CM | POA: Diagnosis not present

## 2016-09-17 DIAGNOSIS — M25512 Pain in left shoulder: Secondary | ICD-10-CM

## 2016-09-17 DIAGNOSIS — I10 Essential (primary) hypertension: Secondary | ICD-10-CM

## 2016-09-17 DIAGNOSIS — G252 Other specified forms of tremor: Secondary | ICD-10-CM | POA: Diagnosis not present

## 2016-09-17 LAB — GLUCOSE, CAPILLARY: GLUCOSE-CAPILLARY: 294 mg/dL — AB (ref 65–99)

## 2016-09-17 LAB — POCT GLYCOSYLATED HEMOGLOBIN (HGB A1C): Hemoglobin A1C: 10.2

## 2016-09-17 NOTE — Patient Instructions (Signed)
[name] - -  Thank you for coming in to see me today.   Your diabetes is improving, however, your A1C is still high and your daily blood sugars are still too high.  I think you will likely need another medication or a different group of medications.  I understand that you want to try exercising to help with your shoulder pain and diabetes.  I agree that we can try this for 3 more months. If you do not make further progress though, we will need to discuss another medication.   Thank you!  Come back to see me in 3 months.

## 2016-09-17 NOTE — Progress Notes (Signed)
   Subjective:    Patient ID: [name], male    DOB: September 24, 1950, 66 y.o.   MRN: [id number]  CC: 3 month follow up for DM  HPI  [name] is a 66yo man with pmH of HTN, COPD, DM, HLD, ET, chronic pain who presents for routine follow up.    [name] reports that he is doing well. He has no complaints today.  He reports taking his medication as prescribed.    He has had shoulder pain for a while now.  He notes that it started after an accident where he hurt his left shoulder but has now moved in to this right shoulder. He has a hard time describing the pain and notes that it is "worse than a toothache."  He takes either aleve or hydrocodone for the pain and it calms down.  He will sometimes have some stiffness, but is mostly able to move the arm without issue.  He denies pain currently in the clinic.  He believes going to the gym and working out will help more.  No swelling, erythema or warmth to the joint.   [name] and I have been discussing changes to his medication regimen for DM for some time.  He has had a worsening of his A1C control due to changes in his diet and lifestyle.  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  He believes that when the weather warms up and can exercise more, he will be able to make improvement. He has improved from an A1C of 12 to 10 since last visit.  He is strongly against any injectable therapy and would like to try exercise and diet again for another three months.  He reports compliance with metformin, januvia and sulfonylurea.  He brought in her CBG monitoring and his levels are in the 200-300s which is consistent with A1C finding.  He had a recent eye exam, lipid panel due today.    Review of Systems  Constitutional: Negative for activity change and fatigue.  Eyes: Negative for visual disturbance.  Respiratory: Negative for cough and shortness of breath.   Cardiovascular: Negative for chest pain.  Endocrine:  Negative for polydipsia, polyphagia and polyuria.  Musculoskeletal: Positive for arthralgias (shoulder pain). Negative for gait problem.       Objective:   Physical Exam  Constitutional: He is oriented to person, place, and time. He appears well-developed and well-nourished.  HENT:  Head: Normocephalic and atraumatic.  Cardiovascular: Normal rate, regular rhythm and normal heart sounds.   Musculoskeletal: Normal range of motion.  Shoulder exam: Strength intact, ROM is good with active motion, some pain with extension of the right shoulder.  No pain with ER or IR of right shoulder.  Some TTP over the [facility] area of the right shoulder.  Negative empty bucket sign. Left shoulder in contrast has good ROM, normal strength, no TTP  Neurological: He is alert and oriented to person, place, and time.  Psychiatric: He has a normal mood and affect. His behavior is normal.    CMET, lipid panel today.      Assessment & Plan:  RTC in 3 months

## 2016-09-18 DIAGNOSIS — M1990 Unspecified osteoarthritis, unspecified site: Secondary | ICD-10-CM | POA: Diagnosis not present

## 2016-09-18 DIAGNOSIS — G252 Other specified forms of tremor: Secondary | ICD-10-CM | POA: Diagnosis not present

## 2016-09-18 DIAGNOSIS — G250 Essential tremor: Secondary | ICD-10-CM | POA: Diagnosis not present

## 2016-09-18 LAB — CMP14 + ANION GAP
A/G RATIO: 1.3 (ref 1.2–2.2)
ALT: 11 IU/L (ref 0–44)
AST: 11 IU/L (ref 0–40)
Albumin: 4.4 g/dL (ref 3.6–4.8)
Alkaline Phosphatase: 112 IU/L (ref 39–117)
Anion Gap: 14 mmol/L (ref 10.0–18.0)
BUN / CREAT RATIO: 11 (ref 10–24)
BUN: 13 mg/dL (ref 8–27)
Bilirubin Total: 0.2 mg/dL (ref 0.0–1.2)
CALCIUM: 10.1 mg/dL (ref 8.6–10.2)
CO2: 27 mmol/L (ref 18–29)
Chloride: 98 mmol/L (ref 96–106)
Creatinine, Ser: 1.18 mg/dL (ref 0.76–1.27)
GFR, EST AFRICAN AMERICAN: 74 mL/min/{1.73_m2} (ref >59)
GFR, EST NON AFRICAN AMERICAN: 64 mL/min/{1.73_m2} (ref >59)
GLOBULIN, TOTAL: 3.3 g/dL (ref 1.5–4.5)
Glucose: 329 mg/dL — ABNORMAL HIGH (ref 65–99)
Potassium: 4.9 mmol/L (ref 3.5–5.2)
SODIUM: 139 mmol/L (ref 134–144)
Total Protein: 7.7 g/dL (ref 6.0–8.5)

## 2016-09-18 LAB — LIPID PANEL
CHOL/HDL RATIO: 3.3 ratio (ref 0.0–5.0)
Cholesterol, Total: 127 mg/dL (ref 100–199)
HDL: 38 mg/dL — ABNORMAL LOW (ref >39)
LDL Calculated: 25 mg/dL (ref 0–99)
TRIGLYCERIDES: 319 mg/dL — AB (ref 0–149)
VLDL Cholesterol Cal: 64 mg/dL — ABNORMAL HIGH (ref 5–40)

## 2016-09-18 NOTE — Assessment & Plan Note (Signed)
Currently no chest pain or concerning symptoms.  He is on a statin.  Continue to monitor.

## 2016-09-18 NOTE — Assessment & Plan Note (Signed)
We discussed ways to improve the pain.  He was not interested in new therapy or injectable therapy.  He wants to try exercise first.  He plans to start going to the gym.  I advised him to try PT which he will think about.

## 2016-09-18 NOTE — Assessment & Plan Note (Signed)
He is on pravastatin without issue. Check lipid panel today.

## 2016-09-18 NOTE — Assessment & Plan Note (Signed)
Well controlled on current therapy.  He notes only a few times a week needing his inhaler.   Continue spiriva, albuterol PRN

## 2016-09-18 NOTE — Assessment & Plan Note (Signed)
He has chronic tremoring, notes that it does not bother him.   Plan Continue propranolol.

## 2016-09-18 NOTE — Assessment & Plan Note (Addendum)
His A1C is still elevated.  I would prefer to get him on empagliflozin or Victoza.  He is very resistant to any new medications, in particular injectables.  He is going to start walking and exercising more.  He was previously well controlled with more activity.  I advised him that if his A1C is not below 8 at next visit, that we would change his medications.   Check renal function today Check Lipid panel today BP well controlled.   Continue metformin, januvia, glipizide

## 2016-09-18 NOTE — Assessment & Plan Note (Signed)
BP today 130/69.  He is doing well here. He does not have any complaints or issues with his medications.   Continue lisinopril, diltiazem, propranolol.

## 2016-09-21 DIAGNOSIS — M1990 Unspecified osteoarthritis, unspecified site: Secondary | ICD-10-CM | POA: Diagnosis not present

## 2016-09-21 DIAGNOSIS — G252 Other specified forms of tremor: Secondary | ICD-10-CM | POA: Diagnosis not present

## 2016-09-21 DIAGNOSIS — G250 Essential tremor: Secondary | ICD-10-CM | POA: Diagnosis not present

## 2016-09-22 DIAGNOSIS — G252 Other specified forms of tremor: Secondary | ICD-10-CM | POA: Diagnosis not present

## 2016-09-22 DIAGNOSIS — G250 Essential tremor: Secondary | ICD-10-CM | POA: Diagnosis not present

## 2016-09-22 DIAGNOSIS — M1990 Unspecified osteoarthritis, unspecified site: Secondary | ICD-10-CM | POA: Diagnosis not present

## 2016-09-23 DIAGNOSIS — M1990 Unspecified osteoarthritis, unspecified site: Secondary | ICD-10-CM | POA: Diagnosis not present

## 2016-09-23 DIAGNOSIS — G250 Essential tremor: Secondary | ICD-10-CM | POA: Diagnosis not present

## 2016-09-23 DIAGNOSIS — G252 Other specified forms of tremor: Secondary | ICD-10-CM | POA: Diagnosis not present

## 2016-09-24 DIAGNOSIS — G252 Other specified forms of tremor: Secondary | ICD-10-CM | POA: Diagnosis not present

## 2016-09-24 DIAGNOSIS — G250 Essential tremor: Secondary | ICD-10-CM | POA: Diagnosis not present

## 2016-09-24 DIAGNOSIS — M1990 Unspecified osteoarthritis, unspecified site: Secondary | ICD-10-CM | POA: Diagnosis not present

## 2016-09-25 DIAGNOSIS — G250 Essential tremor: Secondary | ICD-10-CM | POA: Diagnosis not present

## 2016-09-25 DIAGNOSIS — G252 Other specified forms of tremor: Secondary | ICD-10-CM | POA: Diagnosis not present

## 2016-09-25 DIAGNOSIS — M1990 Unspecified osteoarthritis, unspecified site: Secondary | ICD-10-CM | POA: Diagnosis not present

## 2016-09-26 ENCOUNTER — Other Ambulatory Visit: Payer: Self-pay | Admitting: Internal Medicine

## 2016-09-26 DIAGNOSIS — G252 Other specified forms of tremor: Secondary | ICD-10-CM | POA: Diagnosis not present

## 2016-09-26 DIAGNOSIS — M1990 Unspecified osteoarthritis, unspecified site: Secondary | ICD-10-CM | POA: Diagnosis not present

## 2016-09-26 DIAGNOSIS — G250 Essential tremor: Secondary | ICD-10-CM | POA: Diagnosis not present

## 2016-09-27 DIAGNOSIS — G252 Other specified forms of tremor: Secondary | ICD-10-CM | POA: Diagnosis not present

## 2016-09-27 DIAGNOSIS — G250 Essential tremor: Secondary | ICD-10-CM | POA: Diagnosis not present

## 2016-09-27 DIAGNOSIS — M1990 Unspecified osteoarthritis, unspecified site: Secondary | ICD-10-CM | POA: Diagnosis not present

## 2016-09-28 DIAGNOSIS — G250 Essential tremor: Secondary | ICD-10-CM | POA: Diagnosis not present

## 2016-09-28 DIAGNOSIS — M1990 Unspecified osteoarthritis, unspecified site: Secondary | ICD-10-CM | POA: Diagnosis not present

## 2016-09-28 DIAGNOSIS — G252 Other specified forms of tremor: Secondary | ICD-10-CM | POA: Diagnosis not present

## 2016-09-29 DIAGNOSIS — G252 Other specified forms of tremor: Secondary | ICD-10-CM | POA: Diagnosis not present

## 2016-09-29 DIAGNOSIS — G250 Essential tremor: Secondary | ICD-10-CM | POA: Diagnosis not present

## 2016-09-29 DIAGNOSIS — M1990 Unspecified osteoarthritis, unspecified site: Secondary | ICD-10-CM | POA: Diagnosis not present

## 2016-09-30 DIAGNOSIS — M1990 Unspecified osteoarthritis, unspecified site: Secondary | ICD-10-CM | POA: Diagnosis not present

## 2016-09-30 DIAGNOSIS — G252 Other specified forms of tremor: Secondary | ICD-10-CM | POA: Diagnosis not present

## 2016-09-30 DIAGNOSIS — G250 Essential tremor: Secondary | ICD-10-CM | POA: Diagnosis not present

## 2016-10-01 DIAGNOSIS — G252 Other specified forms of tremor: Secondary | ICD-10-CM | POA: Diagnosis not present

## 2016-10-01 DIAGNOSIS — G250 Essential tremor: Secondary | ICD-10-CM | POA: Diagnosis not present

## 2016-10-01 DIAGNOSIS — M1990 Unspecified osteoarthritis, unspecified site: Secondary | ICD-10-CM | POA: Diagnosis not present

## 2016-10-02 DIAGNOSIS — G250 Essential tremor: Secondary | ICD-10-CM | POA: Diagnosis not present

## 2016-10-02 DIAGNOSIS — G252 Other specified forms of tremor: Secondary | ICD-10-CM | POA: Diagnosis not present

## 2016-10-02 DIAGNOSIS — M1990 Unspecified osteoarthritis, unspecified site: Secondary | ICD-10-CM | POA: Diagnosis not present

## 2016-10-03 DIAGNOSIS — G250 Essential tremor: Secondary | ICD-10-CM | POA: Diagnosis not present

## 2016-10-03 DIAGNOSIS — M1990 Unspecified osteoarthritis, unspecified site: Secondary | ICD-10-CM | POA: Diagnosis not present

## 2016-10-03 DIAGNOSIS — G252 Other specified forms of tremor: Secondary | ICD-10-CM | POA: Diagnosis not present

## 2016-10-04 DIAGNOSIS — M1990 Unspecified osteoarthritis, unspecified site: Secondary | ICD-10-CM | POA: Diagnosis not present

## 2016-10-04 DIAGNOSIS — G252 Other specified forms of tremor: Secondary | ICD-10-CM | POA: Diagnosis not present

## 2016-10-04 DIAGNOSIS — G250 Essential tremor: Secondary | ICD-10-CM | POA: Diagnosis not present

## 2016-10-05 DIAGNOSIS — G250 Essential tremor: Secondary | ICD-10-CM | POA: Diagnosis not present

## 2016-10-05 DIAGNOSIS — M1990 Unspecified osteoarthritis, unspecified site: Secondary | ICD-10-CM | POA: Diagnosis not present

## 2016-10-05 DIAGNOSIS — G252 Other specified forms of tremor: Secondary | ICD-10-CM | POA: Diagnosis not present

## 2016-10-06 DIAGNOSIS — G250 Essential tremor: Secondary | ICD-10-CM | POA: Diagnosis not present

## 2016-10-06 DIAGNOSIS — M1990 Unspecified osteoarthritis, unspecified site: Secondary | ICD-10-CM | POA: Diagnosis not present

## 2016-10-06 DIAGNOSIS — G252 Other specified forms of tremor: Secondary | ICD-10-CM | POA: Diagnosis not present

## 2016-10-07 DIAGNOSIS — M1990 Unspecified osteoarthritis, unspecified site: Secondary | ICD-10-CM | POA: Diagnosis not present

## 2016-10-07 DIAGNOSIS — G252 Other specified forms of tremor: Secondary | ICD-10-CM | POA: Diagnosis not present

## 2016-10-07 DIAGNOSIS — G250 Essential tremor: Secondary | ICD-10-CM | POA: Diagnosis not present

## 2016-10-08 DIAGNOSIS — G250 Essential tremor: Secondary | ICD-10-CM | POA: Diagnosis not present

## 2016-10-08 DIAGNOSIS — M1990 Unspecified osteoarthritis, unspecified site: Secondary | ICD-10-CM | POA: Diagnosis not present

## 2016-10-08 DIAGNOSIS — G252 Other specified forms of tremor: Secondary | ICD-10-CM | POA: Diagnosis not present

## 2016-10-09 DIAGNOSIS — M1990 Unspecified osteoarthritis, unspecified site: Secondary | ICD-10-CM | POA: Diagnosis not present

## 2016-10-09 DIAGNOSIS — G252 Other specified forms of tremor: Secondary | ICD-10-CM | POA: Diagnosis not present

## 2016-10-09 DIAGNOSIS — G250 Essential tremor: Secondary | ICD-10-CM | POA: Diagnosis not present

## 2016-10-10 DIAGNOSIS — G250 Essential tremor: Secondary | ICD-10-CM | POA: Diagnosis not present

## 2016-10-10 DIAGNOSIS — M1990 Unspecified osteoarthritis, unspecified site: Secondary | ICD-10-CM | POA: Diagnosis not present

## 2016-10-10 DIAGNOSIS — G252 Other specified forms of tremor: Secondary | ICD-10-CM | POA: Diagnosis not present

## 2016-10-11 DIAGNOSIS — M1990 Unspecified osteoarthritis, unspecified site: Secondary | ICD-10-CM | POA: Diagnosis not present

## 2016-10-11 DIAGNOSIS — G250 Essential tremor: Secondary | ICD-10-CM | POA: Diagnosis not present

## 2016-10-11 DIAGNOSIS — G252 Other specified forms of tremor: Secondary | ICD-10-CM | POA: Diagnosis not present

## 2016-10-12 DIAGNOSIS — M1990 Unspecified osteoarthritis, unspecified site: Secondary | ICD-10-CM | POA: Diagnosis not present

## 2016-10-12 DIAGNOSIS — G250 Essential tremor: Secondary | ICD-10-CM | POA: Diagnosis not present

## 2016-10-12 DIAGNOSIS — G252 Other specified forms of tremor: Secondary | ICD-10-CM | POA: Diagnosis not present

## 2016-10-13 DIAGNOSIS — G250 Essential tremor: Secondary | ICD-10-CM | POA: Diagnosis not present

## 2016-10-13 DIAGNOSIS — M1990 Unspecified osteoarthritis, unspecified site: Secondary | ICD-10-CM | POA: Diagnosis not present

## 2016-10-13 DIAGNOSIS — G252 Other specified forms of tremor: Secondary | ICD-10-CM | POA: Diagnosis not present

## 2016-10-14 DIAGNOSIS — G250 Essential tremor: Secondary | ICD-10-CM | POA: Diagnosis not present

## 2016-10-14 DIAGNOSIS — M1990 Unspecified osteoarthritis, unspecified site: Secondary | ICD-10-CM | POA: Diagnosis not present

## 2016-10-14 DIAGNOSIS — G252 Other specified forms of tremor: Secondary | ICD-10-CM | POA: Diagnosis not present

## 2016-10-15 DIAGNOSIS — G252 Other specified forms of tremor: Secondary | ICD-10-CM | POA: Diagnosis not present

## 2016-10-15 DIAGNOSIS — M1990 Unspecified osteoarthritis, unspecified site: Secondary | ICD-10-CM | POA: Diagnosis not present

## 2016-10-15 DIAGNOSIS — G250 Essential tremor: Secondary | ICD-10-CM | POA: Diagnosis not present

## 2016-10-16 DIAGNOSIS — G252 Other specified forms of tremor: Secondary | ICD-10-CM | POA: Diagnosis not present

## 2016-10-16 DIAGNOSIS — G250 Essential tremor: Secondary | ICD-10-CM | POA: Diagnosis not present

## 2016-10-16 DIAGNOSIS — M1990 Unspecified osteoarthritis, unspecified site: Secondary | ICD-10-CM | POA: Diagnosis not present

## 2016-10-17 DIAGNOSIS — M1990 Unspecified osteoarthritis, unspecified site: Secondary | ICD-10-CM | POA: Diagnosis not present

## 2016-10-17 DIAGNOSIS — G250 Essential tremor: Secondary | ICD-10-CM | POA: Diagnosis not present

## 2016-10-17 DIAGNOSIS — G252 Other specified forms of tremor: Secondary | ICD-10-CM | POA: Diagnosis not present

## 2016-10-18 DIAGNOSIS — M1990 Unspecified osteoarthritis, unspecified site: Secondary | ICD-10-CM | POA: Diagnosis not present

## 2016-10-18 DIAGNOSIS — G252 Other specified forms of tremor: Secondary | ICD-10-CM | POA: Diagnosis not present

## 2016-10-18 DIAGNOSIS — G250 Essential tremor: Secondary | ICD-10-CM | POA: Diagnosis not present

## 2016-10-19 ENCOUNTER — Other Ambulatory Visit: Payer: Self-pay | Admitting: Internal Medicine

## 2016-10-19 DIAGNOSIS — G252 Other specified forms of tremor: Secondary | ICD-10-CM | POA: Diagnosis not present

## 2016-10-19 DIAGNOSIS — G250 Essential tremor: Secondary | ICD-10-CM | POA: Diagnosis not present

## 2016-10-19 DIAGNOSIS — M1990 Unspecified osteoarthritis, unspecified site: Secondary | ICD-10-CM | POA: Diagnosis not present

## 2016-10-20 DIAGNOSIS — G250 Essential tremor: Secondary | ICD-10-CM | POA: Diagnosis not present

## 2016-10-20 DIAGNOSIS — G252 Other specified forms of tremor: Secondary | ICD-10-CM | POA: Diagnosis not present

## 2016-10-20 DIAGNOSIS — M1990 Unspecified osteoarthritis, unspecified site: Secondary | ICD-10-CM | POA: Diagnosis not present

## 2016-10-21 DIAGNOSIS — M1990 Unspecified osteoarthritis, unspecified site: Secondary | ICD-10-CM | POA: Diagnosis not present

## 2016-10-21 DIAGNOSIS — G250 Essential tremor: Secondary | ICD-10-CM | POA: Diagnosis not present

## 2016-10-21 DIAGNOSIS — G252 Other specified forms of tremor: Secondary | ICD-10-CM | POA: Diagnosis not present

## 2016-10-22 DIAGNOSIS — M1990 Unspecified osteoarthritis, unspecified site: Secondary | ICD-10-CM | POA: Diagnosis not present

## 2016-10-22 DIAGNOSIS — G252 Other specified forms of tremor: Secondary | ICD-10-CM | POA: Diagnosis not present

## 2016-10-22 DIAGNOSIS — G250 Essential tremor: Secondary | ICD-10-CM | POA: Diagnosis not present

## 2016-10-23 DIAGNOSIS — M1990 Unspecified osteoarthritis, unspecified site: Secondary | ICD-10-CM | POA: Diagnosis not present

## 2016-10-23 DIAGNOSIS — G252 Other specified forms of tremor: Secondary | ICD-10-CM | POA: Diagnosis not present

## 2016-10-23 DIAGNOSIS — G250 Essential tremor: Secondary | ICD-10-CM | POA: Diagnosis not present

## 2016-10-24 DIAGNOSIS — G250 Essential tremor: Secondary | ICD-10-CM | POA: Diagnosis not present

## 2016-10-24 DIAGNOSIS — G252 Other specified forms of tremor: Secondary | ICD-10-CM | POA: Diagnosis not present

## 2016-10-24 DIAGNOSIS — M1990 Unspecified osteoarthritis, unspecified site: Secondary | ICD-10-CM | POA: Diagnosis not present

## 2016-10-25 DIAGNOSIS — G252 Other specified forms of tremor: Secondary | ICD-10-CM | POA: Diagnosis not present

## 2016-10-25 DIAGNOSIS — M1990 Unspecified osteoarthritis, unspecified site: Secondary | ICD-10-CM | POA: Diagnosis not present

## 2016-10-25 DIAGNOSIS — G250 Essential tremor: Secondary | ICD-10-CM | POA: Diagnosis not present

## 2016-10-26 DIAGNOSIS — G252 Other specified forms of tremor: Secondary | ICD-10-CM | POA: Diagnosis not present

## 2016-10-26 DIAGNOSIS — G250 Essential tremor: Secondary | ICD-10-CM | POA: Diagnosis not present

## 2016-10-26 DIAGNOSIS — M1990 Unspecified osteoarthritis, unspecified site: Secondary | ICD-10-CM | POA: Diagnosis not present

## 2016-10-28 DIAGNOSIS — M1990 Unspecified osteoarthritis, unspecified site: Secondary | ICD-10-CM | POA: Diagnosis not present

## 2016-10-28 DIAGNOSIS — G252 Other specified forms of tremor: Secondary | ICD-10-CM | POA: Diagnosis not present

## 2016-10-28 DIAGNOSIS — G250 Essential tremor: Secondary | ICD-10-CM | POA: Diagnosis not present

## 2016-10-29 ENCOUNTER — Other Ambulatory Visit: Payer: Self-pay

## 2016-10-29 DIAGNOSIS — G250 Essential tremor: Secondary | ICD-10-CM | POA: Diagnosis not present

## 2016-10-29 DIAGNOSIS — G252 Other specified forms of tremor: Secondary | ICD-10-CM | POA: Diagnosis not present

## 2016-10-29 DIAGNOSIS — M1990 Unspecified osteoarthritis, unspecified site: Secondary | ICD-10-CM | POA: Diagnosis not present

## 2016-10-29 DIAGNOSIS — M170 Bilateral primary osteoarthritis of knee: Secondary | ICD-10-CM

## 2016-10-29 NOTE — Telephone Encounter (Signed)
HYDROcodone-acetaminophen (NORCO) 7.5-325 MG tablet, REFILL REQUEST

## 2016-10-30 DIAGNOSIS — G252 Other specified forms of tremor: Secondary | ICD-10-CM | POA: Diagnosis not present

## 2016-10-30 DIAGNOSIS — G250 Essential tremor: Secondary | ICD-10-CM | POA: Diagnosis not present

## 2016-10-30 DIAGNOSIS — M1990 Unspecified osteoarthritis, unspecified site: Secondary | ICD-10-CM | POA: Diagnosis not present

## 2016-10-31 DIAGNOSIS — M1990 Unspecified osteoarthritis, unspecified site: Secondary | ICD-10-CM | POA: Diagnosis not present

## 2016-10-31 DIAGNOSIS — G250 Essential tremor: Secondary | ICD-10-CM | POA: Diagnosis not present

## 2016-10-31 DIAGNOSIS — G252 Other specified forms of tremor: Secondary | ICD-10-CM | POA: Diagnosis not present

## 2016-11-01 DIAGNOSIS — G252 Other specified forms of tremor: Secondary | ICD-10-CM | POA: Diagnosis not present

## 2016-11-01 DIAGNOSIS — M1990 Unspecified osteoarthritis, unspecified site: Secondary | ICD-10-CM | POA: Diagnosis not present

## 2016-11-01 DIAGNOSIS — G250 Essential tremor: Secondary | ICD-10-CM | POA: Diagnosis not present

## 2016-11-02 DIAGNOSIS — G252 Other specified forms of tremor: Secondary | ICD-10-CM | POA: Diagnosis not present

## 2016-11-02 DIAGNOSIS — M1990 Unspecified osteoarthritis, unspecified site: Secondary | ICD-10-CM | POA: Diagnosis not present

## 2016-11-02 DIAGNOSIS — G250 Essential tremor: Secondary | ICD-10-CM | POA: Diagnosis not present

## 2016-11-03 ENCOUNTER — Telehealth: Payer: Self-pay | Admitting: Internal Medicine

## 2016-11-03 DIAGNOSIS — M1990 Unspecified osteoarthritis, unspecified site: Secondary | ICD-10-CM | POA: Diagnosis not present

## 2016-11-03 DIAGNOSIS — G252 Other specified forms of tremor: Secondary | ICD-10-CM | POA: Diagnosis not present

## 2016-11-03 DIAGNOSIS — G250 Essential tremor: Secondary | ICD-10-CM | POA: Diagnosis not present

## 2016-11-03 MED ORDER — HYDROCODONE-ACETAMINOPHEN 7.5-325 MG PO TABS: 1.0000 | ORAL_TABLET | Freq: Four times a day (QID) | ORAL | 0 refills | Status: DC | PRN

## 2016-11-03 NOTE — Telephone Encounter (Signed)
NEEDS MEDICATION REFILL, [facility] [address]

## 2016-11-04 DIAGNOSIS — G250 Essential tremor: Secondary | ICD-10-CM | POA: Diagnosis not present

## 2016-11-04 DIAGNOSIS — M1990 Unspecified osteoarthritis, unspecified site: Secondary | ICD-10-CM | POA: Diagnosis not present

## 2016-11-04 DIAGNOSIS — G252 Other specified forms of tremor: Secondary | ICD-10-CM | POA: Diagnosis not present

## 2016-11-05 DIAGNOSIS — G252 Other specified forms of tremor: Secondary | ICD-10-CM | POA: Diagnosis not present

## 2016-11-05 DIAGNOSIS — M1990 Unspecified osteoarthritis, unspecified site: Secondary | ICD-10-CM | POA: Diagnosis not present

## 2016-11-05 DIAGNOSIS — G250 Essential tremor: Secondary | ICD-10-CM | POA: Diagnosis not present

## 2016-11-06 DIAGNOSIS — M1990 Unspecified osteoarthritis, unspecified site: Secondary | ICD-10-CM | POA: Diagnosis not present

## 2016-11-06 DIAGNOSIS — G252 Other specified forms of tremor: Secondary | ICD-10-CM | POA: Diagnosis not present

## 2016-11-06 DIAGNOSIS — G250 Essential tremor: Secondary | ICD-10-CM | POA: Diagnosis not present

## 2016-11-07 DIAGNOSIS — M1990 Unspecified osteoarthritis, unspecified site: Secondary | ICD-10-CM | POA: Diagnosis not present

## 2016-11-07 DIAGNOSIS — G252 Other specified forms of tremor: Secondary | ICD-10-CM | POA: Diagnosis not present

## 2016-11-07 DIAGNOSIS — G250 Essential tremor: Secondary | ICD-10-CM | POA: Diagnosis not present

## 2016-11-08 DIAGNOSIS — M1990 Unspecified osteoarthritis, unspecified site: Secondary | ICD-10-CM | POA: Diagnosis not present

## 2016-11-08 DIAGNOSIS — G252 Other specified forms of tremor: Secondary | ICD-10-CM | POA: Diagnosis not present

## 2016-11-08 DIAGNOSIS — G250 Essential tremor: Secondary | ICD-10-CM | POA: Diagnosis not present

## 2016-11-09 DIAGNOSIS — G252 Other specified forms of tremor: Secondary | ICD-10-CM | POA: Diagnosis not present

## 2016-11-09 DIAGNOSIS — G250 Essential tremor: Secondary | ICD-10-CM | POA: Diagnosis not present

## 2016-11-09 DIAGNOSIS — M1990 Unspecified osteoarthritis, unspecified site: Secondary | ICD-10-CM | POA: Diagnosis not present

## 2016-11-10 DIAGNOSIS — G252 Other specified forms of tremor: Secondary | ICD-10-CM | POA: Diagnosis not present

## 2016-11-10 DIAGNOSIS — G250 Essential tremor: Secondary | ICD-10-CM | POA: Diagnosis not present

## 2016-11-10 DIAGNOSIS — M1990 Unspecified osteoarthritis, unspecified site: Secondary | ICD-10-CM | POA: Diagnosis not present

## 2016-11-11 DIAGNOSIS — G252 Other specified forms of tremor: Secondary | ICD-10-CM | POA: Diagnosis not present

## 2016-11-11 DIAGNOSIS — M1990 Unspecified osteoarthritis, unspecified site: Secondary | ICD-10-CM | POA: Diagnosis not present

## 2016-11-11 DIAGNOSIS — G250 Essential tremor: Secondary | ICD-10-CM | POA: Diagnosis not present

## 2016-11-12 DIAGNOSIS — M1990 Unspecified osteoarthritis, unspecified site: Secondary | ICD-10-CM | POA: Diagnosis not present

## 2016-11-12 DIAGNOSIS — G250 Essential tremor: Secondary | ICD-10-CM | POA: Diagnosis not present

## 2016-11-12 DIAGNOSIS — G252 Other specified forms of tremor: Secondary | ICD-10-CM | POA: Diagnosis not present

## 2016-11-13 DIAGNOSIS — M1990 Unspecified osteoarthritis, unspecified site: Secondary | ICD-10-CM | POA: Diagnosis not present

## 2016-11-13 DIAGNOSIS — G250 Essential tremor: Secondary | ICD-10-CM | POA: Diagnosis not present

## 2016-11-13 DIAGNOSIS — G252 Other specified forms of tremor: Secondary | ICD-10-CM | POA: Diagnosis not present

## 2016-11-14 DIAGNOSIS — G252 Other specified forms of tremor: Secondary | ICD-10-CM | POA: Diagnosis not present

## 2016-11-14 DIAGNOSIS — G250 Essential tremor: Secondary | ICD-10-CM | POA: Diagnosis not present

## 2016-11-14 DIAGNOSIS — M1990 Unspecified osteoarthritis, unspecified site: Secondary | ICD-10-CM | POA: Diagnosis not present

## 2016-11-15 DIAGNOSIS — G252 Other specified forms of tremor: Secondary | ICD-10-CM | POA: Diagnosis not present

## 2016-11-15 DIAGNOSIS — M1990 Unspecified osteoarthritis, unspecified site: Secondary | ICD-10-CM | POA: Diagnosis not present

## 2016-11-15 DIAGNOSIS — G250 Essential tremor: Secondary | ICD-10-CM | POA: Diagnosis not present

## 2016-11-16 DIAGNOSIS — G250 Essential tremor: Secondary | ICD-10-CM | POA: Diagnosis not present

## 2016-11-16 DIAGNOSIS — M1990 Unspecified osteoarthritis, unspecified site: Secondary | ICD-10-CM | POA: Diagnosis not present

## 2016-11-16 DIAGNOSIS — G252 Other specified forms of tremor: Secondary | ICD-10-CM | POA: Diagnosis not present

## 2016-11-18 DIAGNOSIS — G250 Essential tremor: Secondary | ICD-10-CM | POA: Diagnosis not present

## 2016-11-18 DIAGNOSIS — M1990 Unspecified osteoarthritis, unspecified site: Secondary | ICD-10-CM | POA: Diagnosis not present

## 2016-11-18 DIAGNOSIS — G252 Other specified forms of tremor: Secondary | ICD-10-CM | POA: Diagnosis not present

## 2016-11-19 DIAGNOSIS — M1990 Unspecified osteoarthritis, unspecified site: Secondary | ICD-10-CM | POA: Diagnosis not present

## 2016-11-19 DIAGNOSIS — G252 Other specified forms of tremor: Secondary | ICD-10-CM | POA: Diagnosis not present

## 2016-11-19 DIAGNOSIS — G250 Essential tremor: Secondary | ICD-10-CM | POA: Diagnosis not present

## 2016-11-20 DIAGNOSIS — G252 Other specified forms of tremor: Secondary | ICD-10-CM | POA: Diagnosis not present

## 2016-11-20 DIAGNOSIS — M1990 Unspecified osteoarthritis, unspecified site: Secondary | ICD-10-CM | POA: Diagnosis not present

## 2016-11-20 DIAGNOSIS — G250 Essential tremor: Secondary | ICD-10-CM | POA: Diagnosis not present

## 2016-11-21 DIAGNOSIS — G252 Other specified forms of tremor: Secondary | ICD-10-CM | POA: Diagnosis not present

## 2016-11-21 DIAGNOSIS — M1990 Unspecified osteoarthritis, unspecified site: Secondary | ICD-10-CM | POA: Diagnosis not present

## 2016-11-21 DIAGNOSIS — G250 Essential tremor: Secondary | ICD-10-CM | POA: Diagnosis not present

## 2016-11-22 DIAGNOSIS — G250 Essential tremor: Secondary | ICD-10-CM | POA: Diagnosis not present

## 2016-11-22 DIAGNOSIS — M1990 Unspecified osteoarthritis, unspecified site: Secondary | ICD-10-CM | POA: Diagnosis not present

## 2016-11-22 DIAGNOSIS — G252 Other specified forms of tremor: Secondary | ICD-10-CM | POA: Diagnosis not present

## 2016-11-23 DIAGNOSIS — G252 Other specified forms of tremor: Secondary | ICD-10-CM | POA: Diagnosis not present

## 2016-11-23 DIAGNOSIS — G250 Essential tremor: Secondary | ICD-10-CM | POA: Diagnosis not present

## 2016-11-23 DIAGNOSIS — M1990 Unspecified osteoarthritis, unspecified site: Secondary | ICD-10-CM | POA: Diagnosis not present

## 2016-11-24 DIAGNOSIS — G252 Other specified forms of tremor: Secondary | ICD-10-CM | POA: Diagnosis not present

## 2016-11-24 DIAGNOSIS — M1990 Unspecified osteoarthritis, unspecified site: Secondary | ICD-10-CM | POA: Diagnosis not present

## 2016-11-24 DIAGNOSIS — G250 Essential tremor: Secondary | ICD-10-CM | POA: Diagnosis not present

## 2016-11-25 DIAGNOSIS — M1990 Unspecified osteoarthritis, unspecified site: Secondary | ICD-10-CM | POA: Diagnosis not present

## 2016-11-25 DIAGNOSIS — G250 Essential tremor: Secondary | ICD-10-CM | POA: Diagnosis not present

## 2016-11-25 DIAGNOSIS — G252 Other specified forms of tremor: Secondary | ICD-10-CM | POA: Diagnosis not present

## 2016-11-26 DIAGNOSIS — G250 Essential tremor: Secondary | ICD-10-CM | POA: Diagnosis not present

## 2016-11-26 DIAGNOSIS — M1990 Unspecified osteoarthritis, unspecified site: Secondary | ICD-10-CM | POA: Diagnosis not present

## 2016-11-26 DIAGNOSIS — G252 Other specified forms of tremor: Secondary | ICD-10-CM | POA: Diagnosis not present

## 2016-11-27 DIAGNOSIS — G252 Other specified forms of tremor: Secondary | ICD-10-CM | POA: Diagnosis not present

## 2016-11-27 DIAGNOSIS — M1990 Unspecified osteoarthritis, unspecified site: Secondary | ICD-10-CM | POA: Diagnosis not present

## 2016-11-27 DIAGNOSIS — G250 Essential tremor: Secondary | ICD-10-CM | POA: Diagnosis not present

## 2016-11-28 DIAGNOSIS — G250 Essential tremor: Secondary | ICD-10-CM | POA: Diagnosis not present

## 2016-11-28 DIAGNOSIS — G252 Other specified forms of tremor: Secondary | ICD-10-CM | POA: Diagnosis not present

## 2016-11-28 DIAGNOSIS — M1990 Unspecified osteoarthritis, unspecified site: Secondary | ICD-10-CM | POA: Diagnosis not present

## 2016-11-29 DIAGNOSIS — G250 Essential tremor: Secondary | ICD-10-CM | POA: Diagnosis not present

## 2016-11-29 DIAGNOSIS — M1990 Unspecified osteoarthritis, unspecified site: Secondary | ICD-10-CM | POA: Diagnosis not present

## 2016-11-29 DIAGNOSIS — G252 Other specified forms of tremor: Secondary | ICD-10-CM | POA: Diagnosis not present

## 2016-11-30 DIAGNOSIS — G250 Essential tremor: Secondary | ICD-10-CM | POA: Diagnosis not present

## 2016-11-30 DIAGNOSIS — G252 Other specified forms of tremor: Secondary | ICD-10-CM | POA: Diagnosis not present

## 2016-11-30 DIAGNOSIS — M1990 Unspecified osteoarthritis, unspecified site: Secondary | ICD-10-CM | POA: Diagnosis not present

## 2016-12-01 DIAGNOSIS — G250 Essential tremor: Secondary | ICD-10-CM | POA: Diagnosis not present

## 2016-12-01 DIAGNOSIS — M1990 Unspecified osteoarthritis, unspecified site: Secondary | ICD-10-CM | POA: Diagnosis not present

## 2016-12-01 DIAGNOSIS — G252 Other specified forms of tremor: Secondary | ICD-10-CM | POA: Diagnosis not present

## 2016-12-02 DIAGNOSIS — M1990 Unspecified osteoarthritis, unspecified site: Secondary | ICD-10-CM | POA: Diagnosis not present

## 2016-12-02 DIAGNOSIS — G252 Other specified forms of tremor: Secondary | ICD-10-CM | POA: Diagnosis not present

## 2016-12-02 DIAGNOSIS — G250 Essential tremor: Secondary | ICD-10-CM | POA: Diagnosis not present

## 2016-12-03 DIAGNOSIS — M1990 Unspecified osteoarthritis, unspecified site: Secondary | ICD-10-CM | POA: Diagnosis not present

## 2016-12-03 DIAGNOSIS — G252 Other specified forms of tremor: Secondary | ICD-10-CM | POA: Diagnosis not present

## 2016-12-03 DIAGNOSIS — G250 Essential tremor: Secondary | ICD-10-CM | POA: Diagnosis not present

## 2016-12-04 DIAGNOSIS — M1990 Unspecified osteoarthritis, unspecified site: Secondary | ICD-10-CM | POA: Diagnosis not present

## 2016-12-04 DIAGNOSIS — G250 Essential tremor: Secondary | ICD-10-CM | POA: Diagnosis not present

## 2016-12-04 DIAGNOSIS — G252 Other specified forms of tremor: Secondary | ICD-10-CM | POA: Diagnosis not present

## 2016-12-05 DIAGNOSIS — G250 Essential tremor: Secondary | ICD-10-CM | POA: Diagnosis not present

## 2016-12-05 DIAGNOSIS — M1990 Unspecified osteoarthritis, unspecified site: Secondary | ICD-10-CM | POA: Diagnosis not present

## 2016-12-05 DIAGNOSIS — G252 Other specified forms of tremor: Secondary | ICD-10-CM | POA: Diagnosis not present

## 2016-12-06 DIAGNOSIS — G252 Other specified forms of tremor: Secondary | ICD-10-CM | POA: Diagnosis not present

## 2016-12-06 DIAGNOSIS — M1990 Unspecified osteoarthritis, unspecified site: Secondary | ICD-10-CM | POA: Diagnosis not present

## 2016-12-06 DIAGNOSIS — G250 Essential tremor: Secondary | ICD-10-CM | POA: Diagnosis not present

## 2016-12-07 DIAGNOSIS — M1990 Unspecified osteoarthritis, unspecified site: Secondary | ICD-10-CM | POA: Diagnosis not present

## 2016-12-07 DIAGNOSIS — G252 Other specified forms of tremor: Secondary | ICD-10-CM | POA: Diagnosis not present

## 2016-12-07 DIAGNOSIS — G250 Essential tremor: Secondary | ICD-10-CM | POA: Diagnosis not present

## 2016-12-08 ENCOUNTER — Telehealth: Payer: Self-pay

## 2016-12-10 ENCOUNTER — Encounter: Payer: Self-pay | Admitting: Internal Medicine

## 2016-12-10 ENCOUNTER — Ambulatory Visit (INDEPENDENT_AMBULATORY_CARE_PROVIDER_SITE_OTHER): Payer: Medicare HMO | Admitting: Internal Medicine

## 2016-12-10 VITALS — BP 126/74 | HR 82 | Temp 98.1°F | Ht 73.0 in | Wt 203.8 lb

## 2016-12-10 DIAGNOSIS — E7800 Pure hypercholesterolemia, unspecified: Secondary | ICD-10-CM

## 2016-12-10 DIAGNOSIS — M1990 Unspecified osteoarthritis, unspecified site: Secondary | ICD-10-CM | POA: Diagnosis not present

## 2016-12-10 DIAGNOSIS — E785 Hyperlipidemia, unspecified: Secondary | ICD-10-CM

## 2016-12-10 DIAGNOSIS — H35039 Hypertensive retinopathy, unspecified eye: Secondary | ICD-10-CM

## 2016-12-10 DIAGNOSIS — F419 Anxiety disorder, unspecified: Secondary | ICD-10-CM

## 2016-12-10 DIAGNOSIS — Z87891 Personal history of nicotine dependence: Secondary | ICD-10-CM | POA: Diagnosis not present

## 2016-12-10 DIAGNOSIS — E11319 Type 2 diabetes mellitus with unspecified diabetic retinopathy without macular edema: Secondary | ICD-10-CM | POA: Diagnosis not present

## 2016-12-10 DIAGNOSIS — E1165 Type 2 diabetes mellitus with hyperglycemia: Secondary | ICD-10-CM | POA: Diagnosis not present

## 2016-12-10 DIAGNOSIS — I10 Essential (primary) hypertension: Secondary | ICD-10-CM

## 2016-12-10 DIAGNOSIS — G250 Essential tremor: Secondary | ICD-10-CM

## 2016-12-10 DIAGNOSIS — J449 Chronic obstructive pulmonary disease, unspecified: Secondary | ICD-10-CM

## 2016-12-10 DIAGNOSIS — M25512 Pain in left shoulder: Secondary | ICD-10-CM

## 2016-12-10 DIAGNOSIS — Z0000 Encounter for general adult medical examination without abnormal findings: Secondary | ICD-10-CM

## 2016-12-10 DIAGNOSIS — E1121 Type 2 diabetes mellitus with diabetic nephropathy: Secondary | ICD-10-CM | POA: Diagnosis not present

## 2016-12-10 DIAGNOSIS — Z79899 Other long term (current) drug therapy: Secondary | ICD-10-CM

## 2016-12-10 DIAGNOSIS — Z801 Family history of malignant neoplasm of trachea, bronchus and lung: Secondary | ICD-10-CM

## 2016-12-10 DIAGNOSIS — H35033 Hypertensive retinopathy, bilateral: Secondary | ICD-10-CM

## 2016-12-10 DIAGNOSIS — M5417 Radiculopathy, lumbosacral region: Secondary | ICD-10-CM

## 2016-12-10 DIAGNOSIS — Z7951 Long term (current) use of inhaled steroids: Secondary | ICD-10-CM

## 2016-12-10 DIAGNOSIS — M5416 Radiculopathy, lumbar region: Secondary | ICD-10-CM

## 2016-12-10 DIAGNOSIS — E119 Type 2 diabetes mellitus without complications: Secondary | ICD-10-CM

## 2016-12-10 DIAGNOSIS — Z7984 Long term (current) use of oral hypoglycemic drugs: Secondary | ICD-10-CM

## 2016-12-10 LAB — POCT GLYCOSYLATED HEMOGLOBIN (HGB A1C): HEMOGLOBIN A1C: 12.4

## 2016-12-10 LAB — GLUCOSE, CAPILLARY: GLUCOSE-CAPILLARY: 292 mg/dL — AB (ref 65–99)

## 2016-12-10 MED ORDER — SITAGLIPTIN PHOS-METFORMIN HCL 50-1000 MG PO TABS: 1.0000 | ORAL_TABLET | Freq: Two times a day (BID) | ORAL | 6 refills | Status: DC

## 2016-12-10 MED ORDER — CANAGLIFLOZIN 100 MG PO TABS: 100.0000 mg | ORAL_TABLET | Freq: Every day | ORAL | 6 refills | Status: DC

## 2016-12-10 NOTE — Progress Notes (Signed)
   Subjective:    Patient ID: [name], male    DOB: 1951/05/24, 66 y.o.   MRN: [id number]  CC: 3 month follow up for DM.   HPI  [name] is a 66yo man with PMH of HLD, HTN, DM, OA, HTN retinopathy, cataracts who presents for routine follow up of his DM.   At last visit, we discussed his DM regimen.  Unfortunately, his A1C is now up to 12.4 from 10.  He reports that he has been trying to change his diet and has been eating fewer sweets.  He has not been able to exercise because it is so hot.  He walks to most places and he has to walk to the [facility] to get his exercise.  He is interested in assistance with transportation.  He has no numbness or tingling of his feet, he has no polyuria.  He does not like needles and therefore does not check his blood sugar.  He is not interested in any option with a needle.    His BP is well controlled today.  He reports his arthritis and shoulder pain are improved/resolved.    Review of Systems  Constitutional: Positive for activity change. Negative for chills, diaphoresis and fatigue.  Respiratory: Negative for cough, choking and shortness of breath.   Cardiovascular: Negative for chest pain, palpitations and leg swelling.  Gastrointestinal: Negative for abdominal pain and blood in stool.  Musculoskeletal: Positive for arthralgias. Negative for gait problem.  Neurological: Positive for dizziness (due to the heat). Negative for syncope.       Objective:   Physical Exam  Constitutional: He is oriented to person, place, and time. He appears well-developed and well-nourished. No distress.  HENT:  Head: Normocephalic and atraumatic.  Cardiovascular: Normal rate, regular rhythm, normal heart sounds and intact distal pulses.   No murmur heard. Pulmonary/Chest: Effort normal. No respiratory distress.  Abdominal: Soft. Bowel sounds are normal. He exhibits no distension.  Musculoskeletal: He exhibits no edema.  Neurological: He is alert and  oriented to person, place, and time.  Skin:  He has blackened toenails which he reports having his entire life.  He has avulsed and missing large toenails bilaterally, he denies any pain, drainage or bleeding.      A1C 12.4      Assessment & Plan:  RTC in 3 months.

## 2016-12-10 NOTE — Patient Instructions (Signed)
[name] - -   Thank you for coming in to see me today.   For your Diabetes - - once you have completed your current prescriptions -  Please Stop metformin and januvia and START the combination pill called Janumet (metformin-januvia combo).   START Canagliflozin (Invokana) 162m daily.   Please go and see [name] our diabetes educator to help with your diet and lifestyle changes.   We have placed a consult for you to get a colonoscopy and a CT scan of the lungs for lung cancer screening  Thank you!  Canagliflozin oral tablets What is this medicine? CANAGLIFLOZIN (KAN a gli FLOE zin) helps to treat type 2 diabetes. It helps to control blood sugar. Treatment is combined with diet and exercise. This medicine may be used for other purposes; ask your health care provider or pharmacist if you have questions. COMMON BRAND NAME(S): Invokana What should I tell my health care provider before I take this medicine? They need to know if you have any of these conditions: -artery disease -dehydration -diabetic ketoacidosis -diet low in salt -eating less due to illness, surgery, dieting, or any other reason -foot sores -having surgery -high cholesterol -high levels of potassium in the blood -history of amputation -history of pancreatitis or pancreas problems -history of yeast infection of the penis or vagina -if you often drink alcohol -infections in the bladder, kidneys, or urinary tract -kidney disease -liver disease -low blood pressure -nerve damage -on hemodialysis -problems urinating -type 1 diabetes -uncircumcised male -an unusual or allergic reaction to canagliflozin, other medicines, foods, dyes, or preservatives -pregnant or trying to get pregnant -breast-feeding How should I use this medicine? Take this medicine by mouth with a glass of water. Follow the directions on the prescription label. Take it before the first meal of the day. Take your dose at the same time each  day. Do not take more often than directed. Do not stop taking except on your doctor's advice. A special MedGuide will be given to you by the pharmacist with each prescription and refill. Be sure to read this information carefully each time. Talk to your pediatrician regarding the use of this medicine in children. Special care may be needed. Overdosage: If you think you have taken too much of this medicine contact a poison control center or emergency room at once. NOTE: This medicine is only for you. Do not share this medicine with others. What if I miss a dose? If you miss a dose, take it as soon as you can. If it is almost time for your next dose, take only that dose. Do not take double or extra doses. What may interact with this medicine? Do not take this medicine with any of the following medications: -gatifloxacin This medicine may also interact with the following medications: -alcohol -certain medicines for blood pressure, heart disease -digoxin -diuretics -insulin -nateglinide -phenobarbital -phenytoin -repaglinide -rifampin -ritonavir -sulfonylureas like glimepiride, glipizide, glyburide This list may not describe all possible interactions. Give your health care provider a list of all the medicines, herbs, non-prescription drugs, or dietary supplements you use. Also tell them if you smoke, drink alcohol, or use illegal drugs. Some items may interact with your medicine. What should I watch for while using this medicine? Visit your doctor or health care professional for regular checks on your progress. This medicine can cause a serious condition in which there is too much acid in the blood. If you develop nausea, vomiting, stomach pain, unusual tiredness, or breathing problems, stop  taking this medicine and call your doctor right away. If possible, use a ketone dipstick to check for ketones in your urine. A test called the HbA1C (A1C) will be monitored. This is a simple blood test. It  measures your blood sugar control over the last 2 to 3 months. You will receive this test every 3 to 6 months. Learn how to check your blood sugar. Learn the symptoms of low and high blood sugar and how to manage them. Always carry a quick-source of sugar with you in case you have symptoms of low blood sugar. Examples include hard sugar candy or glucose tablets. Make sure others know that you can choke if you eat or drink when you develop serious symptoms of low blood sugar, such as seizures or unconsciousness. They must get medical help at once. Tell your doctor or health care professional if you have high blood sugar. You might need to change the dose of your medicine. If you are sick or exercising more than usual, you might need to change the dose of your medicine. Do not skip meals. Ask your doctor or health care professional if you should avoid alcohol. Many nonprescription cough and cold products contain sugar or alcohol. These can affect blood sugar. Wear a medical ID bracelet or chain, and carry a card that describes your disease and details of your medicine and dosage times. What side effects may I notice from receiving this medicine? Side effects that you should report to your doctor or health care professional as soon as possible: -allergic reactions like skin rash, itching or hives, swelling of the face, lips, or tongue -breathing problems -chest pain -dizziness -fast or irregular heartbeat -feeling faint or lightheaded, falls -muscle weakness -nausea, vomiting, unusual stomach upset or pain -new pain or tenderness, change in skin color, sores or ulcers, or infection in legs or feet -signs and symptoms of low blood sugar such as feeling anxious, confusion, dizziness, increased hunger, unusually weak or tired, sweating, shakiness, cold, irritable, headache, blurred vision, fast heartbeat, loss of consciousness -signs and symptoms of a urinary tract infection, such as fever, chills, a  burning feeling when urinating, blood in the urine, back pain -trouble passing urine or change in the amount of urine, including an urgent need to urinate more often, in larger amounts, or at night -penile discharge, itching, or pain in men -unusual tiredness -vaginal discharge, itching, or odor in women Side effects that usually do not require medical attention (report to your doctor or health care professional if they continue or are bothersome): -constipation -mild increase in urination -thirsty This list may not describe all possible side effects. Call your doctor for medical advice about side effects. You may report side effects to FDA at 1-800-FDA-1088. Where should I keep my medicine? Keep out of the reach of children. Store at room temperature between 20 and 25 degrees C (68 and 77 degrees F). Throw away any unused medicine after the expiration date. NOTE: This sheet is a summary. It may not cover all possible information. If you have questions about this medicine, talk to your doctor, pharmacist, or health care provider.  2018 Elsevier/Gold Standard (2015-11-06 14:17:20)   Metformin; Sitagliptin Oral Tablets What is this medicine? METFORMIN; SITAGLIPTIN (met FOR min; sit a GLIP tin) is a combination of 2 medicines used to treat type 2 diabetes. This medicine lowers blood sugar. Treatment is combined with a balanced diet and exercise. This medicine may be used for other purposes; ask your health care provider  or pharmacist if you have questions. COMMON BRAND NAME(S): Janumet What should I tell my health care provider before I take this medicine? They need to know if you have any of these conditions: -become easily dehydrated -diabetic ketoacidosis -frequently drink alcohol-containing beverages -heart disease, past heart attack -kidney disease -liver disease -pancreatitis -polycystic ovary syndrome -previous swelling of the tongue, face, or lips with difficulty breathing,  difficulty swallowing, hoarseness, or tightening of the throat -serious infection or injury -thyroid disease -undergoing surgery or certain x-ray procedures with injectable contrast agents -an unusual or allergic reaction to metformin, sitagliptin, other medicines, foods, dyes, or preservatives -pregnant or trying to get pregnant -breast-feeding How should I use this medicine? Take this medicine by mouth with a glass of water. Follow the directions on the prescription label. Take this medicine with food. Take your doses at regular intervals. Do not take your medicine more often than directed. Do not stop taking except on your doctor's advice. A special MedGuide will be given to you by the pharmacist with each prescription and refill. Be sure to read this information carefully each time. Talk to your pediatrician regarding the use of this medicine in children. Special care may be needed. Overdosage: If you think you have taken too much of this medicine contact a poison control center or emergency room at once. NOTE: This medicine is only for you. Do not share this medicine with others. What if I miss a dose? If you miss a dose, take it as soon as you can. If it is almost time for your next dose, take only that dose. Do not take double or extra doses. What may interact with this medicine? Do not take this medicine with any of the following medications: -dofetilide -gatifloxacin -certain contrast medicines given before X-rays, CT scans, MRI, or other procedures This medicine may also interact with the following medications: -acetazolamide -amiloride -certain antiviral medicines for HIV infection or hepatitis -cimetidine -crizotinib -digoxin -diuretics -male hormones, like estrogens or progestins and birth control pills -glycopyrrolate -isoniazid -lamotrigine -medicines for blood pressure, heart disease, irregular heart beat -memantine -methazolamide -midodrine -morphine -nicotinic  acid -phenothiazines like chlorpromazine, mesoridazine, prochlorperazine, thioridazine -phenytoin -procainamide -propantheline -quinidine -quinine -ranitidine -ranolazine -steroid medicines like prednisone or cortisone -stimulant medicines for attention disorders, weight loss, or to stay awake -thyroid medicines -topiramate -trimethoprim -trospium -vancomycin -vandetanib -zonisamide This list may not describe all possible interactions. Give your health care provider a list of all the medicines, herbs, non-prescription drugs, or dietary supplements you use. Also tell them if you smoke, drink alcohol, or use illegal drugs. Some items may interact with your medicine. What should I watch for while using this medicine? Visit your doctor or health care professional for regular checks on your progress. A test called the HbA1C (A1C) will be monitored. This is a simple blood test. It measures your blood sugar control over the last 2 to 3 months. You will receive this test every 3 to 6 months. Learn how to check your blood sugar. Learn the symptoms of high or low blood sugar and how to manage them. Always carry a quick-source of sugar with you in case you have symptoms of low blood sugar. Examples include hard sugar candy or glucose tablets. Make sure others know that you can choke if you eat or drink when you develop serious symptoms of low blood sugar, such as seizures or unconsciousness. They must get medical help at once. Tell your doctor or health care professional if you have high blood  sugar. You might need to change the dose of your medicine. If you are sick or exercising more than usual, you might need to change the dose of your medicine. Do not skip meals. Ask your doctor or health care professional if you should avoid alcohol. Many nonprescription cough and cold products contain sugar or alcohol. These can affect blood sugar. This medicine may cause ovulation in premenopausal women who do  not have regular monthly periods. This may increase your chances of becoming pregnant. You should not take this medicine if you become pregnant or think you may be pregnant. Talk with your doctor or health care professional about your birth control options while taking this medicine. Contact your doctor or health care professional right away if think you are pregnant. If you are going to need surgery, a MRI, CT scan, or other procedure, tell your doctor that you are taking this medicine. You may need to stop taking this medicine before the procedure. Wear a medical ID bracelet or chain, and carry a card that describes your disease and details of your medicine and dosage times. What side effects may I notice from receiving this medicine? Side effects that you should report to your doctor or health care professional as soon as possible: -allergic reactions like skin rash, itching or hives, swelling of the face, lips, or tongue -breathing problems -feeling faint or lightheaded, falls -fever, chills -joint pain -loss of appetite -muscle aches or pains -nausea, vomiting -signs and symptoms of low blood sugar such as feeling anxious, confusion, dizziness, increased hunger, unusually weak or tired, sweating, shakiness, cold, irritable, headache, blurred vision, fast heartbeat, loss of consciousness -slow or irregular heartbeat -swelling of the hands, legs, and/or feet -unusual stomach pain or discomfort Side effects that usually do not require medical attention (report to your doctor or health care professional if they continue or are bothersome): -diarrhea -headache -metallic taste in the mouth -stomach gas -stuffy or runny nose This list may not describe all possible side effects. Call your doctor for medical advice about side effects. You may report side effects to FDA at 1-800-FDA-1088. Where should I keep my medicine? Keep out of the reach of children. Store at room temperature between 15 and  30 degrees C (59 and 86 degrees F). Throw away any unused medicine after the expiration date. NOTE: This sheet is a summary. It may not cover all possible information. If you have questions about this medicine, talk to your doctor, pharmacist, or health care provider.  2018 Elsevier/Gold Standard (2014-02-17 17:51:37)

## 2016-12-11 NOTE — Assessment & Plan Note (Signed)
He is due for colonoscopy this year - referral placed.   He refused flu shot due to needle phobia - address at next visit.

## 2016-12-11 NOTE — Assessment & Plan Note (Signed)
BP is well controlled today.  He is taking his medications as prescribed, these include diltiazem, lisinopril and propranolol.  He is on 2 nodal blocking agents, his pulse today is 82.  This will need to be monitored closely.   Plan Continue current medications.

## 2016-12-11 NOTE — Assessment & Plan Note (Signed)
Resolved

## 2016-12-11 NOTE — Assessment & Plan Note (Signed)
He has had a CT scan of the chest for lung cancer screening and is due this year.  He has seen the lung cancer screening group who are managing this.

## 2016-12-11 NOTE — Assessment & Plan Note (Signed)
At the end of the visit, he reports worsening tremor and "anxiety."  He reports already taking his propranol 2 tabs in the morning and 1 at night.   Plan Increase propranol to 80mg  BID Monitor pulse closely at next visit.

## 2016-12-11 NOTE — Assessment & Plan Note (Signed)
Last seen by Ophtho in December 2017.  He is following up as planned.  Continue good BP control.

## 2016-12-11 NOTE — Assessment & Plan Note (Signed)
No acute issue today.  He takes hydrocodone with good results and is able to get to the grocery store and to social events due to pain control (he has no reliable transportation so walks to everything).

## 2016-12-11 NOTE — Assessment & Plan Note (Signed)
LDL at last check was 25.  Continue pravastatin.  He has no adverse effects from this medication.

## 2016-12-11 NOTE — Assessment & Plan Note (Signed)
Uncontrolled today with A1C worsening to 12.4.  He reports attempting to eat better.  He has no reliable transportation and therefore cannot currently exercise due to the heat.  He is interested in applying for [employer] transportation, which I will ask our case coordinator to look in to.    He is very resistant to injectable therapy and would not allow me to even suggest these options.  He also doesn't want to take another pill at this time.    To compromise, we will change his [place] and metformin into a combo pill (Janumet).  Continue glipizide at current dose and add canagliflozen 100mg  daily.  He will also see our diabetic educator, Debera Lat.    He is not significantly overweight.  He reports strict adherence to plan.  If patient is actually taking his medications, consider checking for autoimmune antibodies in future if DM is not able to be controlled.

## 2016-12-11 NOTE — Assessment & Plan Note (Signed)
Currently not an issue.  He does have a history of heavy smoking and has had screening low dose CT scanning.   Plan Continue spiriva, albuterol PRN

## 2016-12-22 ENCOUNTER — Encounter: Payer: Self-pay | Admitting: *Deleted

## 2016-12-30 ENCOUNTER — Ambulatory Visit (INDEPENDENT_AMBULATORY_CARE_PROVIDER_SITE_OTHER): Payer: Medicare HMO | Admitting: Dietician

## 2016-12-30 DIAGNOSIS — Z7984 Long term (current) use of oral hypoglycemic drugs: Secondary | ICD-10-CM | POA: Diagnosis not present

## 2016-12-30 DIAGNOSIS — Z713 Dietary counseling and surveillance: Secondary | ICD-10-CM | POA: Diagnosis not present

## 2016-12-30 DIAGNOSIS — E118 Type 2 diabetes mellitus with unspecified complications: Secondary | ICD-10-CM | POA: Diagnosis not present

## 2016-12-30 DIAGNOSIS — E119 Type 2 diabetes mellitus without complications: Secondary | ICD-10-CM

## 2016-12-30 LAB — GLUCOSE, CAPILLARY: Glucose-Capillary: 256 mg/dL — ABNORMAL HIGH (ref 65–99)

## 2016-12-30 NOTE — Patient Instructions (Addendum)
Try to take your meter with you when you go out of town to check your blood sugar daily.  You could weigh 190# and still be healthy.    When you pick up the Janument and start it, stop the metformin.  You should be taking the folliwng diabetes medicines:    Morning Evening  Invokanna 100 mg   Glipizide 20 mg 10 mg  Janumet 50-1000 mg  1 pill 1 pill    Check your blood sugar one time a day or two times every other day.(when home)  You should have more energy the closer your blood sugar is to 70-99.  Your goal is to try to eat more vegetables and eat them first.  See you in August with your meter  to see how the changes are working.

## 2016-12-30 NOTE — Progress Notes (Signed)
  Medical Nutrition Therapy:  Appt start time: 950 end time:  1050. Visit # 1- last visit for MNT or DSMT was 2013  Assessment:  Primary concerns today: glycemic control "Can you recommend a vitamin that will give me more energy?. I never got the new diabetes medicine. she didn't send them in" Mr. Markie has high blood sugars and has not started his new medications. He wants to walk to get his sugar down, but has not gotten transportation and says that still a barrier.  He spoke to care manger today who help him get the applicatication for [employer] to help him get to the gym to walk. He currently is walking two days a week right now. Goes out of town often to his Sisters, cousins,is home three days a week.  He has been on insulin before and doesn't want to to have to take it.  Preferred Learning Style: No preference indicated  Learning Readiness: Contemplating vs. Ready  ANTHROPOMETRICS: weight-201 height- 73", BMI-26 WEIGHT HISTORY: Highest: 222# Lowest- current SLEEP:he reports "good", bedtime 1130 Pm wakes up at 9 AM Denies Nausea,Vomiting, Diarrhea, Constipation. MEDICATIONS: metformin 1000 mg, glipizide 20 mg, januvia 100 mg BLOOD SUGAR:265 fasting,  Says he checks once a day when home, 198,200. 175, drops to 140 when walking, Labs- high triglycerides and low HDL indicating likely insulin resistance.  DIETARY INTAKE: Usual eating pattern includes 2-3 meals and 1-2 snacks per day. Dining Out (times/week): need to assess  Food intolerance/Avoided foods include lactose   24-hr recall:  B ( AM): skipped today L ( PM): [place] and beans D ( PM): loves and eats vegetables but doesn't eat 1 cup a day Snk ( PM): sherbet Beverages: water, sprite zero   Progress Towards Goal(s):  In progress.   Nutritional Diagnosis:  NB-1.1 Food and nutrition-related knowledge deficit As related to lack of prior sufficient diabetes training.  As evidenced by his report and questions.    Intervention:   Nutrition education about general diabetes disease, medications and meal planning. . Coordination of care: spoke to care manager today, called his pharmacy about his new medicines.   Teaching Method Utilized: Visual,Auditory,Hands on Handouts given during visit include:AVS, diabetes meal planning book Barriers to learning/adherence to lifestyle change: transportation, competing values. Demonstrated degree of understanding via:  Teach Back   Monitoring/Evaluation:  Dietary intake, exercise, meter, and body weight in 4 week(s)  Arjuna Doeden, Butch Penny, RD 12/30/2016 11:06 AM. .

## 2016-12-30 NOTE — Addendum Note (Signed)
Addended by: Hulan Fray on: 12/30/2016 06:28 PM   Modules accepted: Orders

## 2016-12-31 DIAGNOSIS — M1990 Unspecified osteoarthritis, unspecified site: Secondary | ICD-10-CM | POA: Diagnosis not present

## 2016-12-31 DIAGNOSIS — G250 Essential tremor: Secondary | ICD-10-CM | POA: Diagnosis not present

## 2016-12-31 DIAGNOSIS — G252 Other specified forms of tremor: Secondary | ICD-10-CM | POA: Diagnosis not present

## 2016-12-31 NOTE — Progress Notes (Signed)
Dr. Maudie Mercury - -  When you return, can you call and confirm diabetes medications at the pharmacy for Mr. Brunty.  Thanks!

## 2017-01-01 DIAGNOSIS — G252 Other specified forms of tremor: Secondary | ICD-10-CM | POA: Diagnosis not present

## 2017-01-01 DIAGNOSIS — M1990 Unspecified osteoarthritis, unspecified site: Secondary | ICD-10-CM | POA: Diagnosis not present

## 2017-01-01 DIAGNOSIS — G250 Essential tremor: Secondary | ICD-10-CM | POA: Diagnosis not present

## 2017-01-02 DIAGNOSIS — G250 Essential tremor: Secondary | ICD-10-CM | POA: Diagnosis not present

## 2017-01-02 DIAGNOSIS — G252 Other specified forms of tremor: Secondary | ICD-10-CM | POA: Diagnosis not present

## 2017-01-02 DIAGNOSIS — M1990 Unspecified osteoarthritis, unspecified site: Secondary | ICD-10-CM | POA: Diagnosis not present

## 2017-01-03 DIAGNOSIS — G250 Essential tremor: Secondary | ICD-10-CM | POA: Diagnosis not present

## 2017-01-03 DIAGNOSIS — G252 Other specified forms of tremor: Secondary | ICD-10-CM | POA: Diagnosis not present

## 2017-01-03 DIAGNOSIS — M1990 Unspecified osteoarthritis, unspecified site: Secondary | ICD-10-CM | POA: Diagnosis not present

## 2017-01-04 DIAGNOSIS — M1990 Unspecified osteoarthritis, unspecified site: Secondary | ICD-10-CM | POA: Diagnosis not present

## 2017-01-04 DIAGNOSIS — G250 Essential tremor: Secondary | ICD-10-CM | POA: Diagnosis not present

## 2017-01-04 DIAGNOSIS — G252 Other specified forms of tremor: Secondary | ICD-10-CM | POA: Diagnosis not present

## 2017-01-05 DIAGNOSIS — M1990 Unspecified osteoarthritis, unspecified site: Secondary | ICD-10-CM | POA: Diagnosis not present

## 2017-01-05 DIAGNOSIS — G250 Essential tremor: Secondary | ICD-10-CM | POA: Diagnosis not present

## 2017-01-05 DIAGNOSIS — G252 Other specified forms of tremor: Secondary | ICD-10-CM | POA: Diagnosis not present

## 2017-01-06 DIAGNOSIS — G250 Essential tremor: Secondary | ICD-10-CM | POA: Diagnosis not present

## 2017-01-06 DIAGNOSIS — G252 Other specified forms of tremor: Secondary | ICD-10-CM | POA: Diagnosis not present

## 2017-01-06 DIAGNOSIS — M1990 Unspecified osteoarthritis, unspecified site: Secondary | ICD-10-CM | POA: Diagnosis not present

## 2017-01-07 ENCOUNTER — Ambulatory Visit: Payer: Medicare HMO | Admitting: Internal Medicine

## 2017-01-07 DIAGNOSIS — M1990 Unspecified osteoarthritis, unspecified site: Secondary | ICD-10-CM | POA: Diagnosis not present

## 2017-01-07 DIAGNOSIS — G252 Other specified forms of tremor: Secondary | ICD-10-CM | POA: Diagnosis not present

## 2017-01-07 DIAGNOSIS — G250 Essential tremor: Secondary | ICD-10-CM | POA: Diagnosis not present

## 2017-01-08 ENCOUNTER — Encounter: Payer: Self-pay | Admitting: Internal Medicine

## 2017-01-08 ENCOUNTER — Ambulatory Visit (INDEPENDENT_AMBULATORY_CARE_PROVIDER_SITE_OTHER): Payer: Medicare HMO | Admitting: Internal Medicine

## 2017-01-08 DIAGNOSIS — Z79899 Other long term (current) drug therapy: Secondary | ICD-10-CM | POA: Diagnosis not present

## 2017-01-08 DIAGNOSIS — E119 Type 2 diabetes mellitus without complications: Secondary | ICD-10-CM

## 2017-01-08 DIAGNOSIS — Z87891 Personal history of nicotine dependence: Secondary | ICD-10-CM | POA: Diagnosis not present

## 2017-01-08 DIAGNOSIS — G250 Essential tremor: Secondary | ICD-10-CM

## 2017-01-08 DIAGNOSIS — Z7984 Long term (current) use of oral hypoglycemic drugs: Secondary | ICD-10-CM

## 2017-01-08 DIAGNOSIS — M1990 Unspecified osteoarthritis, unspecified site: Secondary | ICD-10-CM | POA: Diagnosis not present

## 2017-01-08 DIAGNOSIS — G252 Other specified forms of tremor: Secondary | ICD-10-CM | POA: Diagnosis not present

## 2017-01-08 LAB — GLUCOSE, CAPILLARY: GLUCOSE-CAPILLARY: 511 mg/dL — AB (ref 65–99)

## 2017-01-08 MED ORDER — PROPRANOLOL HCL 80 MG PO TABS: 80.0000 mg | ORAL_TABLET | Freq: Two times a day (BID) | ORAL | 2 refills | Status: DC

## 2017-01-08 MED ORDER — CANAGLIFLOZIN 100 MG PO TABS: 100.0000 mg | ORAL_TABLET | Freq: Every day | ORAL | 6 refills | Status: DC

## 2017-01-08 MED ORDER — PRIMIDONE 50 MG PO TABS: 50.0000 mg | ORAL_TABLET | Freq: Every day | ORAL | 2 refills | Status: DC

## 2017-01-08 NOTE — Progress Notes (Signed)
   Subjective:    Patient ID: [name], male    DOB: 08/28/1950, 66 y.o.   MRN: [id number]  CC: 1 month follow up for tremor  HPI  [name] is a 66yo man with PMH of essential tremor and diabetes.   For his essential tremor, he notes that it is an action tremor and does not bother him at rest.  He has had it "his whole life." When he was a teenager he would drink alcohol and it would get better.  To his knowledge no one in his family has this disease.  He has been on propranolol for a long while but feels that his symptoms are getting worse over time.  He is now having trouble eating and writing.  We discussed seeing a neurologist and he is nervous about this prospect.   His BS was 511 today.  He has not picked up cangliflozin which was suggested at last visit.  He has a needle phobia so we are trying to maximize oral therapy.    He notes that he missed his colonoscopy appointment but will call and reschedule.    Review of Systems  Constitutional: Negative for activity change, chills and fatigue.  Respiratory: Negative for cough and shortness of breath.   Cardiovascular: Negative for chest pain.  Musculoskeletal: Positive for arthralgias and back pain.  Neurological: Positive for tremors. Negative for dizziness, seizures, weakness and light-headedness.       Objective:   Physical Exam  Constitutional: He is oriented to person, place, and time. He appears well-developed and well-nourished. No distress.  HENT:  Head: Normocephalic and atraumatic.  Cardiovascular: Normal rate, regular rhythm and normal heart sounds.   Musculoskeletal: He exhibits no edema or tenderness.  Neurological: He is alert and oriented to person, place, and time. No cranial nerve deficit. He exhibits normal muscle tone. Coordination normal.  Strength 5/5 throughout.  Large amplitude tremor with finger to nose. Tremor noted while he was speaking on his phone.  No tremor at rest.  No cogwheeling.     Psychiatric: He has a normal mood and affect. His behavior is normal.  Vitals reviewed.   No blood work today.       Assessment & Plan:  RTC in 2 months

## 2017-01-08 NOTE — Assessment & Plan Note (Signed)
His blood sugar was high today.  He had no symptoms of this today including chest pain, nausea, blurry vision, polyuria.  I have been advising him to start canagliflozin, but he has not done so.  He reports only taking janumet and glipizide.   Plan Sent a new Rx for canagliflozin to pharmacy and advised him start this medication Check A1C in 2 months, if still not controlled will have to discuss injectable therapy  [employer] bus application was given to me today to help him get transportation so he can exercise.

## 2017-01-08 NOTE — Patient Instructions (Signed)
Mr. Emma - -  It was a pleasure to see you today.   For your tremor -   Please Continue to take propranolol 80 mg twice a day.  I have sent you a new prescription for 80mg  tablets.  Please read your prescription carefully and make sure that you are only taking 80mg  twice a day.   Also, please START taking primidone 50mg  daily.  Please let me know if you have any issues with this medication.   For your Diabetes, please pick up Canagliflozin (invokana) and start taking this medication.   Thank you!  Please come back to see me in 1-2 months.   Primidone tablets What is this medicine? PRIMIDONE (PRI mi done) is a barbiturate. This medicine is used to control seizures in certain types of epilepsy. It is not for use in absence (petit mal) seizures. This medicine may be used for other purposes; ask your health care provider or pharmacist if you have questions. COMMON BRAND NAME(S): Mysoline What should I tell my health care provider before I take this medicine? They need to know if you have any of these conditions: -kidney disease -liver disease -porphyria -suicidal thoughts, plans, or attempt; a previous suicide attempt by you or a family member -an unusual or allergic reaction to primidone, phenobarbital, other barbiturates or seizure medications, other medicines, foods, dyes, or preservatives -pregnant or trying to get pregnant -breast-feeding How should I use this medicine? Take this medicine by mouth with a glass of water. Follow the directions on the prescription label. Take your doses at regular intervals. Do not take your medicine more often than directed. Do not stop taking except on the advice of your doctor or health care professional. A special MedGuide will be given to you by the pharmacist with each prescription and refill. Be sure to read this information carefully each time. Contact your pediatrician or health care professional regarding the use of this medication in  children. Special care may be needed. While this drug may be prescribed for children for selected conditions, precautions do apply. Overdosage: If you think you have taken too much of this medicine contact a poison control center or emergency room at once. NOTE: This medicine is only for you. Do not share this medicine with others. What if I miss a dose? If you miss a dose, take it as soon as you can. If it is almost time for your next dose, take only that dose. Do not take double or extra doses. What may interact with this medicine? Do not take this medicine with any of the following medications: -voriconazole This medicine may also interact with the following medications: -cancer-treating medications -cyclosporine -disopyramide -doxycycline -male hormones, including contraceptive or birth control pills -medicines for mental depression, anxiety or other mood problems -medicines for treating HIV infection or AIDS -modafinil -prescription pain medications -quinidine -warfarin This list may not describe all possible interactions. Give your health care provider a list of all the medicines, herbs, non-prescription drugs, or dietary supplements you use. Also tell them if you smoke, drink alcohol, or use illegal drugs. Some items may interact with your medicine. What should I watch for while using this medicine? Visit your doctor or health care professional for regular checks on your progress. It may be 2 to 3 weeks before you see the full effects of this medicine. Do not suddenly stop taking this medicine, you may increase the risk of seizures. Your doctor or health care professional may want to gradually reduce the  dose. Wear a medical identification bracelet or chain to say you have epilepsy, and carry a card that lists all your medications. You may get drowsy or dizzy. Do not drive, use machinery, or do anything that needs mental alertness until you know how this medicine affects you. Do not  stand or sit up quickly, especially if you are an older patient. This reduces the risk of dizzy or fainting spells. Alcohol may interfere with the effect of this medicine. Avoid alcoholic drinks. Birth control pills may not work properly while you are taking this medicine. Talk to your doctor about using an extra method of birth control. The use of this medicine may increase the chance of suicidal thoughts or actions. Pay special attention to how you are responding while on this medicine. Any worsening of mood, or thoughts of suicide or dying should be reported to your health care professional right away. Women who become pregnant while using this medicine may enroll in the [place] Pregnancy Registry by calling [phone]. This registry collects information about the safety of antiepileptic drug use during pregnancy. What side effects may I notice from receiving this medicine? Side effects that you should report to your doctor or health care professional as soon as possible: -allergic reactions like skin rash, itching or hives, swelling of the face, lips, or tongue -blurred, double vision, or uncontrollable rolling or movements of the eyes -redness, blistering, peeling or loosening of the skin, including inside the mouth -shortness of breath or difficulty breathing -unusual excitement or restlessness, more likely in children and the elderly -unusually weak or tired -worsening of mood, thoughts or actions of suicide or dying Side effects that usually do not require medical attention (report to your doctor or health care professional if they continue or are bothersome): -clumsiness, unsteadiness, or a hang-over effect -decreased sexual ability -dizziness, drowsiness -loss of appetite -nausea or vomiting This list may not describe all possible side effects. Call your doctor for medical advice about side effects. You may report side effects to FDA at  1-800-FDA-1088. Where should I keep my medicine? Keep out of the reach of children. This medicine may cause accidental overdose and death if it taken by other adults, children, or pets. Mix any unused medicine with a substance like cat litter or coffee grounds. Then throw the medicine away in a sealed container like a sealed bag or a coffee can with a lid. Do not use the medicine after the expiration date. Store at room temperature between 15 and 30 degrees C (59 and 86 degrees F). NOTE: This sheet is a summary. It may not cover all possible information. If you have questions about this medicine, talk to your doctor, pharmacist, or health care provider.  2018 Elsevier/Gold Standard (2013-08-05 15:40:08)  Canagliflozin oral tablets What is this medicine? CANAGLIFLOZIN (KAN a gli FLOE zin) helps to treat type 2 diabetes. It helps to control blood sugar. Treatment is combined with diet and exercise. This medicine may be used for other purposes; ask your health care provider or pharmacist if you have questions. COMMON BRAND NAME(S): Invokana What should I tell my health care provider before I take this medicine? They need to know if you have any of these conditions: -artery disease -dehydration -diabetic ketoacidosis -diet low in salt -eating less due to illness, surgery, dieting, or any other reason -foot sores -having surgery -high cholesterol -high levels of potassium in the blood -history of amputation -history of pancreatitis or pancreas problems -history of yeast  infection of the penis or vagina -if you often drink alcohol -infections in the bladder, kidneys, or urinary tract -kidney disease -liver disease -low blood pressure -nerve damage -on hemodialysis -problems urinating -type 1 diabetes -uncircumcised male -an unusual or allergic reaction to canagliflozin, other medicines, foods, dyes, or preservatives -pregnant or trying to get pregnant -breast-feeding How should I  use this medicine? Take this medicine by mouth with a glass of water. Follow the directions on the prescription label. Take it before the first meal of the day. Take your dose at the same time each day. Do not take more often than directed. Do not stop taking except on your doctor's advice. A special MedGuide will be given to you by the pharmacist with each prescription and refill. Be sure to read this information carefully each time. Talk to your pediatrician regarding the use of this medicine in children. Special care may be needed. Overdosage: If you think you have taken too much of this medicine contact a poison control center or emergency room at once. NOTE: This medicine is only for you. Do not share this medicine with others. What if I miss a dose? If you miss a dose, take it as soon as you can. If it is almost time for your next dose, take only that dose. Do not take double or extra doses. What may interact with this medicine? Do not take this medicine with any of the following medications: -gatifloxacin This medicine may also interact with the following medications: -alcohol -certain medicines for blood pressure, heart disease -digoxin -diuretics -insulin -nateglinide -phenobarbital -phenytoin -repaglinide -rifampin -ritonavir -sulfonylureas like glimepiride, glipizide, glyburide This list may not describe all possible interactions. Give your health care provider a list of all the medicines, herbs, non-prescription drugs, or dietary supplements you use. Also tell them if you smoke, drink alcohol, or use illegal drugs. Some items may interact with your medicine. What should I watch for while using this medicine? Visit your doctor or health care professional for regular checks on your progress. This medicine can cause a serious condition in which there is too much acid in the blood. If you develop nausea, vomiting, stomach pain, unusual tiredness, or breathing problems, stop taking  this medicine and call your doctor right away. If possible, use a ketone dipstick to check for ketones in your urine. A test called the HbA1C (A1C) will be monitored. This is a simple blood test. It measures your blood sugar control over the last 2 to 3 months. You will receive this test every 3 to 6 months. Learn how to check your blood sugar. Learn the symptoms of low and high blood sugar and how to manage them. Always carry a quick-source of sugar with you in case you have symptoms of low blood sugar. Examples include hard sugar candy or glucose tablets. Make sure others know that you can choke if you eat or drink when you develop serious symptoms of low blood sugar, such as seizures or unconsciousness. They must get medical help at once. Tell your doctor or health care professional if you have high blood sugar. You might need to change the dose of your medicine. If you are sick or exercising more than usual, you might need to change the dose of your medicine. Do not skip meals. Ask your doctor or health care professional if you should avoid alcohol. Many nonprescription cough and cold products contain sugar or alcohol. These can affect blood sugar. Wear a medical ID bracelet or chain, and  carry a card that describes your disease and details of your medicine and dosage times. What side effects may I notice from receiving this medicine? Side effects that you should report to your doctor or health care professional as soon as possible: -allergic reactions like skin rash, itching or hives, swelling of the face, lips, or tongue -breathing problems -chest pain -dizziness -fast or irregular heartbeat -feeling faint or lightheaded, falls -muscle weakness -nausea, vomiting, unusual stomach upset or pain -new pain or tenderness, change in skin color, sores or ulcers, or infection in legs or feet -signs and symptoms of low blood sugar such as feeling anxious, confusion, dizziness, increased hunger,  unusually weak or tired, sweating, shakiness, cold, irritable, headache, blurred vision, fast heartbeat, loss of consciousness -signs and symptoms of a urinary tract infection, such as fever, chills, a burning feeling when urinating, blood in the urine, back pain -trouble passing urine or change in the amount of urine, including an urgent need to urinate more often, in larger amounts, or at night -penile discharge, itching, or pain in men -unusual tiredness -vaginal discharge, itching, or odor in women Side effects that usually do not require medical attention (report to your doctor or health care professional if they continue or are bothersome): -constipation -mild increase in urination -thirsty This list may not describe all possible side effects. Call your doctor for medical advice about side effects. You may report side effects to FDA at 1-800-FDA-1088. Where should I keep my medicine? Keep out of the reach of children. Store at room temperature between 20 and 25 degrees C (68 and 77 degrees F). Throw away any unused medicine after the expiration date. NOTE: This sheet is a summary. It may not cover all possible information. If you have questions about this medicine, talk to your doctor, pharmacist, or health care provider.  2018 Elsevier/Gold Standard (2015-11-06 14:17:20)

## 2017-01-08 NOTE — Assessment & Plan Note (Signed)
Based on his history, this seems to be an appropriate diagnosis.  He is frustrated as it seems to be getting worse.  He has been on the higher dose of propranolol 80mg  without much improvement.  We discussed starting primidone today and I went through possible side effects.  I also advised him seeing a neurologist, but he was hesitant about this.  We will try a combination of propranolol and primidone.  If no better when I next see him, will refer to Neurology .  Plan Continue propranolol 80mg  BID (pulse was 76) Start Primidone 50mg  daily at night.

## 2017-01-09 DIAGNOSIS — G252 Other specified forms of tremor: Secondary | ICD-10-CM | POA: Diagnosis not present

## 2017-01-09 DIAGNOSIS — M1990 Unspecified osteoarthritis, unspecified site: Secondary | ICD-10-CM | POA: Diagnosis not present

## 2017-01-09 DIAGNOSIS — G250 Essential tremor: Secondary | ICD-10-CM | POA: Diagnosis not present

## 2017-01-10 DIAGNOSIS — G252 Other specified forms of tremor: Secondary | ICD-10-CM | POA: Diagnosis not present

## 2017-01-10 DIAGNOSIS — G250 Essential tremor: Secondary | ICD-10-CM | POA: Diagnosis not present

## 2017-01-10 DIAGNOSIS — M1990 Unspecified osteoarthritis, unspecified site: Secondary | ICD-10-CM | POA: Diagnosis not present

## 2017-01-11 DIAGNOSIS — G250 Essential tremor: Secondary | ICD-10-CM | POA: Diagnosis not present

## 2017-01-11 DIAGNOSIS — G252 Other specified forms of tremor: Secondary | ICD-10-CM | POA: Diagnosis not present

## 2017-01-11 DIAGNOSIS — M1990 Unspecified osteoarthritis, unspecified site: Secondary | ICD-10-CM | POA: Diagnosis not present

## 2017-01-12 DIAGNOSIS — G250 Essential tremor: Secondary | ICD-10-CM | POA: Diagnosis not present

## 2017-01-12 DIAGNOSIS — G252 Other specified forms of tremor: Secondary | ICD-10-CM | POA: Diagnosis not present

## 2017-01-12 DIAGNOSIS — M1990 Unspecified osteoarthritis, unspecified site: Secondary | ICD-10-CM | POA: Diagnosis not present

## 2017-01-13 DIAGNOSIS — G252 Other specified forms of tremor: Secondary | ICD-10-CM | POA: Diagnosis not present

## 2017-01-13 DIAGNOSIS — G250 Essential tremor: Secondary | ICD-10-CM | POA: Diagnosis not present

## 2017-01-13 DIAGNOSIS — M1990 Unspecified osteoarthritis, unspecified site: Secondary | ICD-10-CM | POA: Diagnosis not present

## 2017-01-14 DIAGNOSIS — G250 Essential tremor: Secondary | ICD-10-CM | POA: Diagnosis not present

## 2017-01-14 DIAGNOSIS — G252 Other specified forms of tremor: Secondary | ICD-10-CM | POA: Diagnosis not present

## 2017-01-14 DIAGNOSIS — M1990 Unspecified osteoarthritis, unspecified site: Secondary | ICD-10-CM | POA: Diagnosis not present

## 2017-01-15 DIAGNOSIS — G250 Essential tremor: Secondary | ICD-10-CM | POA: Diagnosis not present

## 2017-01-15 DIAGNOSIS — M1990 Unspecified osteoarthritis, unspecified site: Secondary | ICD-10-CM | POA: Diagnosis not present

## 2017-01-15 DIAGNOSIS — G252 Other specified forms of tremor: Secondary | ICD-10-CM | POA: Diagnosis not present

## 2017-01-16 DIAGNOSIS — G250 Essential tremor: Secondary | ICD-10-CM | POA: Diagnosis not present

## 2017-01-16 DIAGNOSIS — M1990 Unspecified osteoarthritis, unspecified site: Secondary | ICD-10-CM | POA: Diagnosis not present

## 2017-01-16 DIAGNOSIS — G252 Other specified forms of tremor: Secondary | ICD-10-CM | POA: Diagnosis not present

## 2017-01-17 DIAGNOSIS — G250 Essential tremor: Secondary | ICD-10-CM | POA: Diagnosis not present

## 2017-01-17 DIAGNOSIS — G252 Other specified forms of tremor: Secondary | ICD-10-CM | POA: Diagnosis not present

## 2017-01-17 DIAGNOSIS — M1990 Unspecified osteoarthritis, unspecified site: Secondary | ICD-10-CM | POA: Diagnosis not present

## 2017-01-18 DIAGNOSIS — M1990 Unspecified osteoarthritis, unspecified site: Secondary | ICD-10-CM | POA: Diagnosis not present

## 2017-01-18 DIAGNOSIS — G252 Other specified forms of tremor: Secondary | ICD-10-CM | POA: Diagnosis not present

## 2017-01-18 DIAGNOSIS — G250 Essential tremor: Secondary | ICD-10-CM | POA: Diagnosis not present

## 2017-01-19 DIAGNOSIS — G252 Other specified forms of tremor: Secondary | ICD-10-CM | POA: Diagnosis not present

## 2017-01-19 DIAGNOSIS — M1990 Unspecified osteoarthritis, unspecified site: Secondary | ICD-10-CM | POA: Diagnosis not present

## 2017-01-19 DIAGNOSIS — G250 Essential tremor: Secondary | ICD-10-CM | POA: Diagnosis not present

## 2017-01-20 DIAGNOSIS — M1990 Unspecified osteoarthritis, unspecified site: Secondary | ICD-10-CM | POA: Diagnosis not present

## 2017-01-20 DIAGNOSIS — G252 Other specified forms of tremor: Secondary | ICD-10-CM | POA: Diagnosis not present

## 2017-01-20 DIAGNOSIS — G250 Essential tremor: Secondary | ICD-10-CM | POA: Diagnosis not present

## 2017-01-21 ENCOUNTER — Telehealth: Payer: Self-pay | Admitting: *Deleted

## 2017-01-21 DIAGNOSIS — M1990 Unspecified osteoarthritis, unspecified site: Secondary | ICD-10-CM | POA: Diagnosis not present

## 2017-01-21 DIAGNOSIS — G252 Other specified forms of tremor: Secondary | ICD-10-CM | POA: Diagnosis not present

## 2017-01-21 DIAGNOSIS — G250 Essential tremor: Secondary | ICD-10-CM | POA: Diagnosis not present

## 2017-01-21 NOTE — Telephone Encounter (Signed)
[facility] requesting refill (alcohol pads). Patient does not use this pharmacy.

## 2017-01-21 NOTE — Telephone Encounter (Signed)
[facility]? Pharmacy called and stated they needed an order for alcohol pads for the pt, that he will continue to receive his other supplies from someone else, called pt and ask him if he wanted the pads from this place and he stated no but they keep calling, tried to speak w/ [facility] rep again but call had been disconnected.

## 2017-01-22 DIAGNOSIS — M1990 Unspecified osteoarthritis, unspecified site: Secondary | ICD-10-CM | POA: Diagnosis not present

## 2017-01-22 DIAGNOSIS — G252 Other specified forms of tremor: Secondary | ICD-10-CM | POA: Diagnosis not present

## 2017-01-22 DIAGNOSIS — G250 Essential tremor: Secondary | ICD-10-CM | POA: Diagnosis not present

## 2017-01-23 DIAGNOSIS — G252 Other specified forms of tremor: Secondary | ICD-10-CM | POA: Diagnosis not present

## 2017-01-23 DIAGNOSIS — G250 Essential tremor: Secondary | ICD-10-CM | POA: Diagnosis not present

## 2017-01-23 DIAGNOSIS — M1990 Unspecified osteoarthritis, unspecified site: Secondary | ICD-10-CM | POA: Diagnosis not present

## 2017-01-24 ENCOUNTER — Other Ambulatory Visit: Payer: Self-pay | Admitting: Internal Medicine

## 2017-01-24 DIAGNOSIS — G250 Essential tremor: Secondary | ICD-10-CM | POA: Diagnosis not present

## 2017-01-24 DIAGNOSIS — G252 Other specified forms of tremor: Secondary | ICD-10-CM | POA: Diagnosis not present

## 2017-01-24 DIAGNOSIS — M1990 Unspecified osteoarthritis, unspecified site: Secondary | ICD-10-CM | POA: Diagnosis not present

## 2017-01-25 DIAGNOSIS — M1990 Unspecified osteoarthritis, unspecified site: Secondary | ICD-10-CM | POA: Diagnosis not present

## 2017-01-25 DIAGNOSIS — G250 Essential tremor: Secondary | ICD-10-CM | POA: Diagnosis not present

## 2017-01-25 DIAGNOSIS — G252 Other specified forms of tremor: Secondary | ICD-10-CM | POA: Diagnosis not present

## 2017-01-26 DIAGNOSIS — G252 Other specified forms of tremor: Secondary | ICD-10-CM | POA: Diagnosis not present

## 2017-01-26 DIAGNOSIS — G250 Essential tremor: Secondary | ICD-10-CM | POA: Diagnosis not present

## 2017-01-26 DIAGNOSIS — M1990 Unspecified osteoarthritis, unspecified site: Secondary | ICD-10-CM | POA: Diagnosis not present

## 2017-01-27 DIAGNOSIS — G252 Other specified forms of tremor: Secondary | ICD-10-CM | POA: Diagnosis not present

## 2017-01-27 DIAGNOSIS — M1990 Unspecified osteoarthritis, unspecified site: Secondary | ICD-10-CM | POA: Diagnosis not present

## 2017-01-27 DIAGNOSIS — G250 Essential tremor: Secondary | ICD-10-CM | POA: Diagnosis not present

## 2017-01-28 DIAGNOSIS — G252 Other specified forms of tremor: Secondary | ICD-10-CM | POA: Diagnosis not present

## 2017-01-28 DIAGNOSIS — G250 Essential tremor: Secondary | ICD-10-CM | POA: Diagnosis not present

## 2017-01-28 DIAGNOSIS — M1990 Unspecified osteoarthritis, unspecified site: Secondary | ICD-10-CM | POA: Diagnosis not present

## 2017-01-29 ENCOUNTER — Other Ambulatory Visit: Payer: Self-pay | Admitting: Internal Medicine

## 2017-01-29 DIAGNOSIS — G250 Essential tremor: Secondary | ICD-10-CM | POA: Diagnosis not present

## 2017-01-29 DIAGNOSIS — M170 Bilateral primary osteoarthritis of knee: Secondary | ICD-10-CM

## 2017-01-29 DIAGNOSIS — M1990 Unspecified osteoarthritis, unspecified site: Secondary | ICD-10-CM | POA: Diagnosis not present

## 2017-01-29 DIAGNOSIS — G252 Other specified forms of tremor: Secondary | ICD-10-CM | POA: Diagnosis not present

## 2017-01-29 NOTE — Telephone Encounter (Signed)
NEEDS REFILL ON PAIN MEDICATION °

## 2017-01-30 DIAGNOSIS — G252 Other specified forms of tremor: Secondary | ICD-10-CM | POA: Diagnosis not present

## 2017-01-30 DIAGNOSIS — M1990 Unspecified osteoarthritis, unspecified site: Secondary | ICD-10-CM | POA: Diagnosis not present

## 2017-01-30 DIAGNOSIS — G250 Essential tremor: Secondary | ICD-10-CM | POA: Diagnosis not present

## 2017-01-30 MED ORDER — HYDROCODONE-ACETAMINOPHEN 7.5-325 MG PO TABS: 1.0000 | ORAL_TABLET | Freq: Four times a day (QID) | ORAL | 0 refills | Status: DC | PRN

## 2017-01-30 NOTE — Telephone Encounter (Signed)
Will fill X 3.  He also needs an appointment in September with me if you can arrange.  Thank you.  October is fine if there are no spots.

## 2017-01-31 DIAGNOSIS — M1990 Unspecified osteoarthritis, unspecified site: Secondary | ICD-10-CM | POA: Diagnosis not present

## 2017-01-31 DIAGNOSIS — G252 Other specified forms of tremor: Secondary | ICD-10-CM | POA: Diagnosis not present

## 2017-01-31 DIAGNOSIS — G250 Essential tremor: Secondary | ICD-10-CM | POA: Diagnosis not present

## 2017-02-01 DIAGNOSIS — G250 Essential tremor: Secondary | ICD-10-CM | POA: Diagnosis not present

## 2017-02-01 DIAGNOSIS — M1990 Unspecified osteoarthritis, unspecified site: Secondary | ICD-10-CM | POA: Diagnosis not present

## 2017-02-01 DIAGNOSIS — G252 Other specified forms of tremor: Secondary | ICD-10-CM | POA: Diagnosis not present

## 2017-02-02 DIAGNOSIS — G252 Other specified forms of tremor: Secondary | ICD-10-CM | POA: Diagnosis not present

## 2017-02-02 DIAGNOSIS — E119 Type 2 diabetes mellitus without complications: Secondary | ICD-10-CM | POA: Diagnosis not present

## 2017-02-02 DIAGNOSIS — M1990 Unspecified osteoarthritis, unspecified site: Secondary | ICD-10-CM | POA: Diagnosis not present

## 2017-02-02 DIAGNOSIS — G250 Essential tremor: Secondary | ICD-10-CM | POA: Diagnosis not present

## 2017-02-02 DIAGNOSIS — I10 Essential (primary) hypertension: Secondary | ICD-10-CM | POA: Diagnosis not present

## 2017-02-02 DIAGNOSIS — Z1211 Encounter for screening for malignant neoplasm of colon: Secondary | ICD-10-CM | POA: Diagnosis not present

## 2017-02-03 ENCOUNTER — Ambulatory Visit: Payer: Medicare HMO | Admitting: Dietician

## 2017-02-03 DIAGNOSIS — G252 Other specified forms of tremor: Secondary | ICD-10-CM | POA: Diagnosis not present

## 2017-02-03 DIAGNOSIS — G250 Essential tremor: Secondary | ICD-10-CM | POA: Diagnosis not present

## 2017-02-03 DIAGNOSIS — M1990 Unspecified osteoarthritis, unspecified site: Secondary | ICD-10-CM | POA: Diagnosis not present

## 2017-02-04 DIAGNOSIS — G252 Other specified forms of tremor: Secondary | ICD-10-CM | POA: Diagnosis not present

## 2017-02-04 DIAGNOSIS — M1990 Unspecified osteoarthritis, unspecified site: Secondary | ICD-10-CM | POA: Diagnosis not present

## 2017-02-04 DIAGNOSIS — G250 Essential tremor: Secondary | ICD-10-CM | POA: Diagnosis not present

## 2017-02-05 DIAGNOSIS — M1990 Unspecified osteoarthritis, unspecified site: Secondary | ICD-10-CM | POA: Diagnosis not present

## 2017-02-05 DIAGNOSIS — G252 Other specified forms of tremor: Secondary | ICD-10-CM | POA: Diagnosis not present

## 2017-02-05 DIAGNOSIS — G250 Essential tremor: Secondary | ICD-10-CM | POA: Diagnosis not present

## 2017-02-06 DIAGNOSIS — G252 Other specified forms of tremor: Secondary | ICD-10-CM | POA: Diagnosis not present

## 2017-02-06 DIAGNOSIS — M1990 Unspecified osteoarthritis, unspecified site: Secondary | ICD-10-CM | POA: Diagnosis not present

## 2017-02-06 DIAGNOSIS — G250 Essential tremor: Secondary | ICD-10-CM | POA: Diagnosis not present

## 2017-02-07 DIAGNOSIS — G252 Other specified forms of tremor: Secondary | ICD-10-CM | POA: Diagnosis not present

## 2017-02-07 DIAGNOSIS — M1990 Unspecified osteoarthritis, unspecified site: Secondary | ICD-10-CM | POA: Diagnosis not present

## 2017-02-07 DIAGNOSIS — G250 Essential tremor: Secondary | ICD-10-CM | POA: Diagnosis not present

## 2017-02-08 DIAGNOSIS — M1990 Unspecified osteoarthritis, unspecified site: Secondary | ICD-10-CM | POA: Diagnosis not present

## 2017-02-08 DIAGNOSIS — G252 Other specified forms of tremor: Secondary | ICD-10-CM | POA: Diagnosis not present

## 2017-02-08 DIAGNOSIS — G250 Essential tremor: Secondary | ICD-10-CM | POA: Diagnosis not present

## 2017-02-10 DIAGNOSIS — D123 Benign neoplasm of transverse colon: Secondary | ICD-10-CM | POA: Diagnosis not present

## 2017-02-10 DIAGNOSIS — D122 Benign neoplasm of ascending colon: Secondary | ICD-10-CM | POA: Diagnosis not present

## 2017-02-10 DIAGNOSIS — Z1211 Encounter for screening for malignant neoplasm of colon: Secondary | ICD-10-CM | POA: Diagnosis not present

## 2017-02-10 DIAGNOSIS — K5730 Diverticulosis of large intestine without perforation or abscess without bleeding: Secondary | ICD-10-CM | POA: Diagnosis not present

## 2017-02-10 DIAGNOSIS — K635 Polyp of colon: Secondary | ICD-10-CM | POA: Diagnosis not present

## 2017-02-17 ENCOUNTER — Encounter: Payer: Self-pay | Admitting: Acute Care

## 2017-03-05 DIAGNOSIS — M1990 Unspecified osteoarthritis, unspecified site: Secondary | ICD-10-CM | POA: Diagnosis not present

## 2017-03-06 DIAGNOSIS — M1990 Unspecified osteoarthritis, unspecified site: Secondary | ICD-10-CM | POA: Diagnosis not present

## 2017-03-07 DIAGNOSIS — M1990 Unspecified osteoarthritis, unspecified site: Secondary | ICD-10-CM | POA: Diagnosis not present

## 2017-03-08 DIAGNOSIS — M1990 Unspecified osteoarthritis, unspecified site: Secondary | ICD-10-CM | POA: Diagnosis not present

## 2017-03-19 ENCOUNTER — Telehealth: Payer: Self-pay | Admitting: Internal Medicine

## 2017-03-19 DIAGNOSIS — G250 Essential tremor: Secondary | ICD-10-CM

## 2017-03-19 NOTE — Telephone Encounter (Signed)
Pls want physician to call back to get a referral

## 2017-03-19 NOTE — Telephone Encounter (Signed)
Lm for rtc 

## 2017-03-23 DIAGNOSIS — M1990 Unspecified osteoarthritis, unspecified site: Secondary | ICD-10-CM | POA: Diagnosis not present

## 2017-03-24 ENCOUNTER — Other Ambulatory Visit: Payer: Self-pay | Admitting: Internal Medicine

## 2017-03-24 DIAGNOSIS — M1990 Unspecified osteoarthritis, unspecified site: Secondary | ICD-10-CM | POA: Diagnosis not present

## 2017-03-24 NOTE — Telephone Encounter (Signed)
Late Entry:  Spoke with pt on 10/4-he is requesting a neurology referral.  States he has already discussed this with his pcp.  Will forward request to pcp for review.  Pt does have an upcoming appt on 10/17 with pcp.  Please advise.Despina Hidden Cassady10/10/20183:57 PM

## 2017-03-25 ENCOUNTER — Encounter: Payer: Medicare HMO | Admitting: Internal Medicine

## 2017-03-25 ENCOUNTER — Other Ambulatory Visit: Payer: Self-pay | Admitting: Internal Medicine

## 2017-03-25 DIAGNOSIS — M1990 Unspecified osteoarthritis, unspecified site: Secondary | ICD-10-CM | POA: Diagnosis not present

## 2017-03-26 DIAGNOSIS — M1990 Unspecified osteoarthritis, unspecified site: Secondary | ICD-10-CM | POA: Diagnosis not present

## 2017-03-27 DIAGNOSIS — M1990 Unspecified osteoarthritis, unspecified site: Secondary | ICD-10-CM | POA: Diagnosis not present

## 2017-03-28 DIAGNOSIS — M1990 Unspecified osteoarthritis, unspecified site: Secondary | ICD-10-CM | POA: Diagnosis not present

## 2017-03-29 DIAGNOSIS — M1990 Unspecified osteoarthritis, unspecified site: Secondary | ICD-10-CM | POA: Diagnosis not present

## 2017-03-30 DIAGNOSIS — M1990 Unspecified osteoarthritis, unspecified site: Secondary | ICD-10-CM | POA: Diagnosis not present

## 2017-03-31 DIAGNOSIS — M1990 Unspecified osteoarthritis, unspecified site: Secondary | ICD-10-CM | POA: Diagnosis not present

## 2017-04-01 DIAGNOSIS — M1990 Unspecified osteoarthritis, unspecified site: Secondary | ICD-10-CM | POA: Diagnosis not present

## 2017-04-01 NOTE — Telephone Encounter (Signed)
We did discuss at last visit, but he was unsure.  I am happy to place consult today for essential tremor.

## 2017-04-02 DIAGNOSIS — M1990 Unspecified osteoarthritis, unspecified site: Secondary | ICD-10-CM | POA: Diagnosis not present

## 2017-04-03 DIAGNOSIS — M1990 Unspecified osteoarthritis, unspecified site: Secondary | ICD-10-CM | POA: Diagnosis not present

## 2017-04-04 DIAGNOSIS — M1990 Unspecified osteoarthritis, unspecified site: Secondary | ICD-10-CM | POA: Diagnosis not present

## 2017-04-05 DIAGNOSIS — M1990 Unspecified osteoarthritis, unspecified site: Secondary | ICD-10-CM | POA: Diagnosis not present

## 2017-04-06 ENCOUNTER — Other Ambulatory Visit: Payer: Self-pay | Admitting: Internal Medicine

## 2017-04-06 DIAGNOSIS — M1990 Unspecified osteoarthritis, unspecified site: Secondary | ICD-10-CM | POA: Diagnosis not present

## 2017-04-06 DIAGNOSIS — G250 Essential tremor: Secondary | ICD-10-CM

## 2017-04-07 DIAGNOSIS — M1990 Unspecified osteoarthritis, unspecified site: Secondary | ICD-10-CM | POA: Diagnosis not present

## 2017-04-08 ENCOUNTER — Ambulatory Visit (INDEPENDENT_AMBULATORY_CARE_PROVIDER_SITE_OTHER): Payer: Medicare HMO | Admitting: Internal Medicine

## 2017-04-08 ENCOUNTER — Encounter: Payer: Self-pay | Admitting: Internal Medicine

## 2017-04-08 VITALS — BP 126/70 | HR 79 | Temp 98.6°F | Ht 73.0 in | Wt 204.5 lb

## 2017-04-08 DIAGNOSIS — Z87891 Personal history of nicotine dependence: Secondary | ICD-10-CM | POA: Diagnosis not present

## 2017-04-08 DIAGNOSIS — Z7984 Long term (current) use of oral hypoglycemic drugs: Secondary | ICD-10-CM

## 2017-04-08 DIAGNOSIS — F4321 Adjustment disorder with depressed mood: Secondary | ICD-10-CM

## 2017-04-08 DIAGNOSIS — E118 Type 2 diabetes mellitus with unspecified complications: Secondary | ICD-10-CM | POA: Diagnosis not present

## 2017-04-08 DIAGNOSIS — Z7951 Long term (current) use of inhaled steroids: Secondary | ICD-10-CM

## 2017-04-08 DIAGNOSIS — G250 Essential tremor: Secondary | ICD-10-CM

## 2017-04-08 DIAGNOSIS — Z0000 Encounter for general adult medical examination without abnormal findings: Secondary | ICD-10-CM

## 2017-04-08 DIAGNOSIS — D126 Benign neoplasm of colon, unspecified: Secondary | ICD-10-CM

## 2017-04-08 DIAGNOSIS — E785 Hyperlipidemia, unspecified: Secondary | ICD-10-CM | POA: Diagnosis not present

## 2017-04-08 DIAGNOSIS — Z23 Encounter for immunization: Secondary | ICD-10-CM | POA: Diagnosis not present

## 2017-04-08 DIAGNOSIS — I10 Essential (primary) hypertension: Secondary | ICD-10-CM

## 2017-04-08 DIAGNOSIS — Z818 Family history of other mental and behavioral disorders: Secondary | ICD-10-CM

## 2017-04-08 DIAGNOSIS — Z79891 Long term (current) use of opiate analgesic: Secondary | ICD-10-CM | POA: Diagnosis not present

## 2017-04-08 DIAGNOSIS — J449 Chronic obstructive pulmonary disease, unspecified: Secondary | ICD-10-CM

## 2017-04-08 DIAGNOSIS — M1990 Unspecified osteoarthritis, unspecified site: Secondary | ICD-10-CM

## 2017-04-08 DIAGNOSIS — Z79899 Other long term (current) drug therapy: Secondary | ICD-10-CM

## 2017-04-08 DIAGNOSIS — E119 Type 2 diabetes mellitus without complications: Secondary | ICD-10-CM | POA: Diagnosis not present

## 2017-04-08 DIAGNOSIS — M170 Bilateral primary osteoarthritis of knee: Secondary | ICD-10-CM

## 2017-04-08 LAB — POCT GLYCOSYLATED HEMOGLOBIN (HGB A1C): HEMOGLOBIN A1C: 10.9

## 2017-04-08 LAB — GLUCOSE, CAPILLARY: GLUCOSE-CAPILLARY: 289 mg/dL — AB (ref 65–99)

## 2017-04-08 MED ORDER — HYDROCODONE-ACETAMINOPHEN 7.5-325 MG PO TABS: 1.0000 | ORAL_TABLET | Freq: Four times a day (QID) | ORAL | 0 refills | Status: DC | PRN

## 2017-04-08 MED ORDER — ALBUTEROL SULFATE HFA 108 (90 BASE) MCG/ACT IN AERS: INHALATION_SPRAY | RESPIRATORY_TRACT | 2 refills | Status: DC

## 2017-04-08 NOTE — Patient Instructions (Signed)
Mr. Tuberville - -  I am very sorry to hear about your grandson.  If you should need further assistance with counseling please let us know so we can help arrange.   For your diabetes, please follow a diabetic eating plan (below) and try to avoid refined sugars.  Your A1C is better, but there is more room to go.  Please continue taking all of your medications as prescribed.   Thank you  Come back to see me in 3 months.    Diabetes Mellitus and Food It is important for you to manage your blood sugar (glucose) level. Your blood glucose level can be greatly affected by what you eat. Eating healthier foods in the appropriate amounts throughout the day at about the same time each day will help you control your blood glucose level. It can also help slow or prevent worsening of your diabetes mellitus. Healthy eating may even help you improve the level of your blood pressure and reach or maintain a healthy weight. General recommendations for healthful eating and cooking habits include:  Eating meals and snacks regularly. Avoid going long periods of time without eating to lose weight.  Eating a diet that consists mainly of plant-based foods, such as fruits, vegetables, nuts, legumes, and whole grains.  Using low-heat cooking methods, such as baking, instead of high-heat cooking methods, such as deep frying.  Work with your dietitian to make sure you understand how to use the Nutrition Facts information on food labels. How can food affect me? Carbohydrates Carbohydrates affect your blood glucose level more than any other type of food. Your dietitian will help you determine how many carbohydrates to eat at each meal and teach you how to count carbohydrates. Counting carbohydrates is important to keep your blood glucose at a healthy level, especially if you are using insulin or taking certain medicines for diabetes mellitus. Alcohol Alcohol can cause sudden decreases in blood glucose (hypoglycemia),  especially if you use insulin or take certain medicines for diabetes mellitus. Hypoglycemia can be a life-threatening condition. Symptoms of hypoglycemia (sleepiness, dizziness, and disorientation) are similar to symptoms of having too much alcohol. If your health care provider has given you approval to drink alcohol, do so in moderation and use the following guidelines:  Women should not have more than one drink per day, and men should not have more than two drinks per day. One drink is equal to: ? 12 oz of beer. ? 5 oz of wine. ? 1 oz of hard liquor.  Do not drink on an empty stomach.  Keep yourself hydrated. Have water, diet soda, or unsweetened iced tea.  Regular soda, juice, and other mixers might contain a lot of carbohydrates and should be counted.  What foods are not recommended? As you make food choices, it is important to remember that all foods are not the same. Some foods have fewer nutrients per serving than other foods, even though they might have the same number of calories or carbohydrates. It is difficult to get your body what it needs when you eat foods with fewer nutrients. Examples of foods that you should avoid that are high in calories and carbohydrates but low in nutrients include:  Trans fats (most processed foods list trans fats on the Nutrition Facts label).  Regular soda.  Juice.  Candy.  Sweets, such as cake, pie, doughnuts, and cookies.  Fried foods.  What foods can I eat? Eat nutrient-rich foods, which will nourish your body and keep you healthy. The  food you should eat also will depend on several factors, including:  The calories you need.  The medicines you take.  Your weight.  Your blood glucose level.  Your blood pressure level.  Your cholesterol level.  You should eat a variety of foods, including:  Protein. ? Lean cuts of meat. ? Proteins low in saturated fats, such as fish, egg whites, and beans. Avoid processed meats.  Fruits  and vegetables. ? Fruits and vegetables that may help control blood glucose levels, such as apples, mangoes, and yams.  Dairy products. ? Choose fat-free or low-fat dairy products, such as milk, yogurt, and cheese.  Grains, bread, pasta, and rice. ? Choose whole grain products, such as multigrain bread, whole oats, and brown rice. These foods may help control blood pressure.  Fats. ? Foods containing healthful fats, such as nuts, avocado, olive oil, canola oil, and fish.  Does everyone with diabetes mellitus have the same meal plan? Because every person with diabetes mellitus is different, there is not one meal plan that works for everyone. It is very important that you meet with a dietitian who will help you create a meal plan that is just right for you. This information is not intended to replace advice given to you by your health care provider. Make sure you discuss any questions you have with your health care provider. Document Released: 03/06/2005 Document Revised: 11/15/2015 Document Reviewed: 05/06/2013 Elsevier Interactive Patient Education  2017 [employer].

## 2017-04-08 NOTE — Progress Notes (Signed)
   Subjective:    Patient ID: [name], male    DOB: Nov 07, 1950, 66 y.o.   MRN: [id number]  CC: 3 month follow up for DM and HTN  HPI   [name] is a 66yo man with PMH of HTN, DM2, HLD, COPD, Osteoarthritis, essential tremor who presents for follow up of DM2.    [name] reports that his grandson recently died and he has been in a fog since that time.  He does report taking his medications as prescribed, however, and not missing doses. His grandson reportedly committed suicide and this has been a big blow to the family.  He reports having family as support and not having any SI or HI.  I discussed with him normal grief reaction.   His A1C today is 10.9, down from 12.4 which I congratulated him on.  Will encourage him to continue eating correctly to help bring this down even more.    He has requested a referral to Neurology for ET and this was placed 1 week ago and he is awaiting an appointment.    His blood pressure was initially elevated, but then was 126/70 on recheck.     Review of Systems  Constitutional: Negative for chills and fever.  Respiratory: Negative for cough and shortness of breath.   Cardiovascular: Negative for chest pain and leg swelling.  Genitourinary: Negative for difficulty urinating, dysuria and frequency.  Musculoskeletal: Positive for arthralgias and back pain. Negative for gait problem.  Psychiatric/Behavioral: Positive for decreased concentration and dysphoric mood. Negative for self-injury and suicidal ideas. The patient is not nervous/anxious.        Objective:   Physical Exam  Constitutional: He is oriented to person, place, and time. He appears well-developed and well-nourished. He appears distressed.  HENT:  Head: Normocephalic and atraumatic.  Cardiovascular: Normal rate, regular rhythm and normal heart sounds.   No murmur heard. Pulmonary/Chest: Effort normal and breath sounds normal. No respiratory distress. He has no wheezes.   Musculoskeletal: He exhibits no edema.  Neurological: He is alert and oriented to person, place, and time.  Psychiatric: His speech is normal and behavior is normal. Thought content normal. His affect is not labile and not inappropriate. He exhibits a depressed mood.  Vitals reviewed.   BMET today.       Assessment & Plan:  RTC in 3 months  Acute Grief - Discussed normal grief reaction with him, support, etc.  He should call if any worsening or feels need for increased support.  Denied SI.

## 2017-04-09 ENCOUNTER — Encounter: Payer: Self-pay | Admitting: Internal Medicine

## 2017-04-09 DIAGNOSIS — M1990 Unspecified osteoarthritis, unspecified site: Secondary | ICD-10-CM | POA: Diagnosis not present

## 2017-04-09 LAB — BMP8+ANION GAP
ANION GAP: 16 mmol/L (ref 10.0–18.0)
BUN/Creatinine Ratio: 10 (ref 10–24)
BUN: 10 mg/dL (ref 8–27)
CO2: 22 mmol/L (ref 20–29)
CREATININE: 0.97 mg/dL (ref 0.76–1.27)
Calcium: 9.6 mg/dL (ref 8.6–10.2)
Chloride: 99 mmol/L (ref 96–106)
GFR calc Af Amer: 93 mL/min/{1.73_m2} (ref >59)
GFR, EST NON AFRICAN AMERICAN: 80 mL/min/{1.73_m2} (ref >59)
Glucose: 333 mg/dL — ABNORMAL HIGH (ref 65–99)
Potassium: 4.4 mmol/L (ref 3.5–5.2)
Sodium: 137 mmol/L (ref 134–144)

## 2017-04-09 NOTE — Assessment & Plan Note (Signed)
Colonoscopy this year showed tubular adenomas with plan to follow up in 3-5 years.   Flu shot today.   Discussed prevnar 13 and he would like to wait until next visit.

## 2017-04-09 NOTE — Assessment & Plan Note (Signed)
Having increased issues with eating and preparing food.  He would like to see Neurology and this referral has been placed.  He is on primidone and propranolol.   Plan Continue propranolol and primidone.

## 2017-04-09 NOTE — Assessment & Plan Note (Signed)
Stable, he is on a stable dose of hydrocodone-apap.  He had an appropriate [place] narcotic database review today.  U tox from 1 year ago was appropriate.   Plan Continue hydrocodone-apap at current dose U tox at next visit.

## 2017-04-09 NOTE — Assessment & Plan Note (Signed)
BP well controlled today on recheck.  He is taking his diltiazem and lisinopril without issue.    Plan Continue current therapy BMET today to evaluate renal function.

## 2017-04-09 NOTE — Assessment & Plan Note (Signed)
A1C today is 10.9 which is down from 12.4 Last eye exam December 2017 showed no DM retinopathy He is on a statin and ARB.   Plan Continue 4 drug regimen, stressed continued low carb/low sugar diet.  Continue invokana, janumet, glipizide.

## 2017-04-09 NOTE — Assessment & Plan Note (Signed)
Well controlled with current inhalers.  He is on spiriva and albuterol.  He has recently run out of albuterol.   Plan Refill albuterol Continue spiriva.

## 2017-04-10 DIAGNOSIS — M1990 Unspecified osteoarthritis, unspecified site: Secondary | ICD-10-CM | POA: Diagnosis not present

## 2017-04-11 DIAGNOSIS — M1990 Unspecified osteoarthritis, unspecified site: Secondary | ICD-10-CM | POA: Diagnosis not present

## 2017-04-12 DIAGNOSIS — M1990 Unspecified osteoarthritis, unspecified site: Secondary | ICD-10-CM | POA: Diagnosis not present

## 2017-04-13 DIAGNOSIS — M1990 Unspecified osteoarthritis, unspecified site: Secondary | ICD-10-CM | POA: Diagnosis not present

## 2017-04-14 DIAGNOSIS — M1990 Unspecified osteoarthritis, unspecified site: Secondary | ICD-10-CM | POA: Diagnosis not present

## 2017-04-15 DIAGNOSIS — M1990 Unspecified osteoarthritis, unspecified site: Secondary | ICD-10-CM | POA: Diagnosis not present

## 2017-04-16 DIAGNOSIS — M1990 Unspecified osteoarthritis, unspecified site: Secondary | ICD-10-CM | POA: Diagnosis not present

## 2017-04-17 DIAGNOSIS — M1990 Unspecified osteoarthritis, unspecified site: Secondary | ICD-10-CM | POA: Diagnosis not present

## 2017-04-18 DIAGNOSIS — M1990 Unspecified osteoarthritis, unspecified site: Secondary | ICD-10-CM | POA: Diagnosis not present

## 2017-04-19 ENCOUNTER — Other Ambulatory Visit: Payer: Self-pay | Admitting: Internal Medicine

## 2017-04-19 DIAGNOSIS — M1990 Unspecified osteoarthritis, unspecified site: Secondary | ICD-10-CM | POA: Diagnosis not present

## 2017-04-20 DIAGNOSIS — M1990 Unspecified osteoarthritis, unspecified site: Secondary | ICD-10-CM | POA: Diagnosis not present

## 2017-04-21 DIAGNOSIS — M1990 Unspecified osteoarthritis, unspecified site: Secondary | ICD-10-CM | POA: Diagnosis not present

## 2017-04-22 DIAGNOSIS — M1990 Unspecified osteoarthritis, unspecified site: Secondary | ICD-10-CM | POA: Diagnosis not present

## 2017-04-23 DIAGNOSIS — M1990 Unspecified osteoarthritis, unspecified site: Secondary | ICD-10-CM | POA: Diagnosis not present

## 2017-04-24 DIAGNOSIS — M1990 Unspecified osteoarthritis, unspecified site: Secondary | ICD-10-CM | POA: Diagnosis not present

## 2017-04-25 DIAGNOSIS — M1990 Unspecified osteoarthritis, unspecified site: Secondary | ICD-10-CM | POA: Diagnosis not present

## 2017-04-26 ENCOUNTER — Other Ambulatory Visit: Payer: Self-pay | Admitting: Internal Medicine

## 2017-04-26 DIAGNOSIS — M1990 Unspecified osteoarthritis, unspecified site: Secondary | ICD-10-CM | POA: Diagnosis not present

## 2017-04-27 DIAGNOSIS — M1990 Unspecified osteoarthritis, unspecified site: Secondary | ICD-10-CM | POA: Diagnosis not present

## 2017-04-28 DIAGNOSIS — M1990 Unspecified osteoarthritis, unspecified site: Secondary | ICD-10-CM | POA: Diagnosis not present

## 2017-04-29 DIAGNOSIS — M1990 Unspecified osteoarthritis, unspecified site: Secondary | ICD-10-CM | POA: Diagnosis not present

## 2017-04-30 DIAGNOSIS — M1990 Unspecified osteoarthritis, unspecified site: Secondary | ICD-10-CM | POA: Diagnosis not present

## 2017-05-01 DIAGNOSIS — M1990 Unspecified osteoarthritis, unspecified site: Secondary | ICD-10-CM | POA: Diagnosis not present

## 2017-05-02 DIAGNOSIS — M1990 Unspecified osteoarthritis, unspecified site: Secondary | ICD-10-CM | POA: Diagnosis not present

## 2017-05-03 DIAGNOSIS — M1990 Unspecified osteoarthritis, unspecified site: Secondary | ICD-10-CM | POA: Diagnosis not present

## 2017-05-04 DIAGNOSIS — M1990 Unspecified osteoarthritis, unspecified site: Secondary | ICD-10-CM | POA: Diagnosis not present

## 2017-05-05 DIAGNOSIS — M1990 Unspecified osteoarthritis, unspecified site: Secondary | ICD-10-CM | POA: Diagnosis not present

## 2017-05-06 DIAGNOSIS — M1990 Unspecified osteoarthritis, unspecified site: Secondary | ICD-10-CM | POA: Diagnosis not present

## 2017-05-07 DIAGNOSIS — E1351 Other specified diabetes mellitus with diabetic peripheral angiopathy without gangrene: Secondary | ICD-10-CM | POA: Diagnosis not present

## 2017-05-07 DIAGNOSIS — L84 Corns and callosities: Secondary | ICD-10-CM | POA: Diagnosis not present

## 2017-05-07 DIAGNOSIS — B351 Tinea unguium: Secondary | ICD-10-CM | POA: Diagnosis not present

## 2017-05-07 DIAGNOSIS — M1990 Unspecified osteoarthritis, unspecified site: Secondary | ICD-10-CM | POA: Diagnosis not present

## 2017-05-08 DIAGNOSIS — M1990 Unspecified osteoarthritis, unspecified site: Secondary | ICD-10-CM | POA: Diagnosis not present

## 2017-05-09 DIAGNOSIS — M1990 Unspecified osteoarthritis, unspecified site: Secondary | ICD-10-CM | POA: Diagnosis not present

## 2017-05-10 DIAGNOSIS — M1990 Unspecified osteoarthritis, unspecified site: Secondary | ICD-10-CM | POA: Diagnosis not present

## 2017-05-11 DIAGNOSIS — M1990 Unspecified osteoarthritis, unspecified site: Secondary | ICD-10-CM | POA: Diagnosis not present

## 2017-05-12 DIAGNOSIS — M1990 Unspecified osteoarthritis, unspecified site: Secondary | ICD-10-CM | POA: Diagnosis not present

## 2017-05-13 DIAGNOSIS — M1990 Unspecified osteoarthritis, unspecified site: Secondary | ICD-10-CM | POA: Diagnosis not present

## 2017-05-14 DIAGNOSIS — M1990 Unspecified osteoarthritis, unspecified site: Secondary | ICD-10-CM | POA: Diagnosis not present

## 2017-05-15 DIAGNOSIS — M1990 Unspecified osteoarthritis, unspecified site: Secondary | ICD-10-CM | POA: Diagnosis not present

## 2017-05-16 DIAGNOSIS — M1990 Unspecified osteoarthritis, unspecified site: Secondary | ICD-10-CM | POA: Diagnosis not present

## 2017-05-17 DIAGNOSIS — M1990 Unspecified osteoarthritis, unspecified site: Secondary | ICD-10-CM | POA: Diagnosis not present

## 2017-05-18 DIAGNOSIS — M1990 Unspecified osteoarthritis, unspecified site: Secondary | ICD-10-CM | POA: Diagnosis not present

## 2017-05-19 DIAGNOSIS — M1990 Unspecified osteoarthritis, unspecified site: Secondary | ICD-10-CM | POA: Diagnosis not present

## 2017-05-20 DIAGNOSIS — M1990 Unspecified osteoarthritis, unspecified site: Secondary | ICD-10-CM | POA: Diagnosis not present

## 2017-05-21 DIAGNOSIS — M1990 Unspecified osteoarthritis, unspecified site: Secondary | ICD-10-CM | POA: Diagnosis not present

## 2017-05-22 DIAGNOSIS — M1990 Unspecified osteoarthritis, unspecified site: Secondary | ICD-10-CM | POA: Diagnosis not present

## 2017-05-23 DIAGNOSIS — M1990 Unspecified osteoarthritis, unspecified site: Secondary | ICD-10-CM | POA: Diagnosis not present

## 2017-05-24 DIAGNOSIS — M1990 Unspecified osteoarthritis, unspecified site: Secondary | ICD-10-CM | POA: Diagnosis not present

## 2017-05-25 DIAGNOSIS — M1990 Unspecified osteoarthritis, unspecified site: Secondary | ICD-10-CM | POA: Diagnosis not present

## 2017-05-26 DIAGNOSIS — M1990 Unspecified osteoarthritis, unspecified site: Secondary | ICD-10-CM | POA: Diagnosis not present

## 2017-05-27 DIAGNOSIS — M1990 Unspecified osteoarthritis, unspecified site: Secondary | ICD-10-CM | POA: Diagnosis not present

## 2017-05-28 DIAGNOSIS — M1990 Unspecified osteoarthritis, unspecified site: Secondary | ICD-10-CM | POA: Diagnosis not present

## 2017-05-29 DIAGNOSIS — M1990 Unspecified osteoarthritis, unspecified site: Secondary | ICD-10-CM | POA: Diagnosis not present

## 2017-05-30 DIAGNOSIS — M1990 Unspecified osteoarthritis, unspecified site: Secondary | ICD-10-CM | POA: Diagnosis not present

## 2017-05-31 DIAGNOSIS — M1990 Unspecified osteoarthritis, unspecified site: Secondary | ICD-10-CM | POA: Diagnosis not present

## 2017-06-01 DIAGNOSIS — M1990 Unspecified osteoarthritis, unspecified site: Secondary | ICD-10-CM | POA: Diagnosis not present

## 2017-06-02 ENCOUNTER — Telehealth: Payer: Self-pay

## 2017-06-02 DIAGNOSIS — M1990 Unspecified osteoarthritis, unspecified site: Secondary | ICD-10-CM | POA: Diagnosis not present

## 2017-06-02 NOTE — Telephone Encounter (Signed)
I called pt's home and cell number, cell number keeps ringing, no VM, left a message on pt's home number advising him that our office is closed tomorrow 06/03/17 and to call us back during business hours.

## 2017-06-03 ENCOUNTER — Encounter: Payer: Self-pay | Admitting: Neurology

## 2017-06-03 ENCOUNTER — Ambulatory Visit: Payer: Commercial Managed Care - HMO | Admitting: Neurology

## 2017-06-03 ENCOUNTER — Ambulatory Visit (INDEPENDENT_AMBULATORY_CARE_PROVIDER_SITE_OTHER): Payer: Medicare HMO | Admitting: Neurology

## 2017-06-03 VITALS — BP 103/71 | HR 90 | Ht 73.0 in | Wt 203.5 lb

## 2017-06-03 DIAGNOSIS — G250 Essential tremor: Secondary | ICD-10-CM

## 2017-06-03 DIAGNOSIS — M1990 Unspecified osteoarthritis, unspecified site: Secondary | ICD-10-CM | POA: Diagnosis not present

## 2017-06-03 DIAGNOSIS — G4489 Other headache syndrome: Secondary | ICD-10-CM

## 2017-06-03 NOTE — Progress Notes (Signed)
Subjective:    Patient ID: [name] is a 66 y.o. male.  HPI     Star Age, MD, PhD [facility] [address] [place], [place] [address]  Dear Dr. Daryll Drown,   I saw your patient, [name], upon your kind request for initial consultation of his tremors. The patient is unaccompanied today. As you know, [name] is a 66 year old right-handed gentleman with an underlying medical history of hypertension, poorly controlled type 2 diabetes, osteoarthritis, hypertension, hyperlipidemia, smoking, COPD, PSVT, and mildly overweight state, who reports a long-standing history of bilateral upper extremity tremors. He has had tremors for decades. He was recently started on propranolol and primidone as I understand. He may have been on these medicines for about 2 months. I reviewed your office note from 04/08/2017. He has been on propranolol 80 mg twice a day and primidone, currently 50 mg at bedtime. No tell-tale FHx of tremors, mom drank alcohol when she was pregnant with him, per patient. He had a problem with alcohol in the past (per his own report) and quit in 2002. He quit smoking in 2008. He does report that nervousness or stress makes his tremor worse. He does not drink caffeine and all his drinks or decaf. He is divorced, he lives alone. He does not have any biological children. He is not sure if the medication has been helpful. He has not had a brain scan. He has not had any recent falls. He has occasional dull headaches, not severe, no history of migraines.  His Past Medical History Is Significant For: Past Medical History:  Diagnosis Date  . Baker's cyst, ruptured   . COPD (chronic obstructive pulmonary disease) ([place])   . Diabetes mellitus, type 2 ([place])   . Erectile dysfunction   . History of tobacco abuse    Year started: 59   Year quit: 01/2007  . Hyperlipidemia   . Hypertension   . Knee osteoarthritis    Bilateral,    Patient has been evaluated by Ortho for knee replacement in the past  . Lipoma of skin   . Osteoarthritis   . PSVT (paroxysmal supraventricular tachycardia) ([place])    Required adenosine 2006  . Trochanteric bursitis of left hip     His Past Surgical History Is Significant For: No past surgical history on file.  His Family History Is Significant For: Family History  Problem Relation Age of Onset  . Cancer Mother        Throat cancer  . Cirrhosis Father   . Alcohol abuse Father     His Social History Is Significant For: Social History   Socioeconomic History  . Marital status: Divorced    Spouse name: None  . Number of children: None  . Years of education: None  . Highest education level: None  Social Needs  . Financial resource strain: None  . Food insecurity - worry: None  . Food insecurity - inability: None  . Transportation needs - medical: None  . Transportation needs - non-medical: None  Occupational History  . None  Tobacco Use  . Smoking status: Former Smoker    Packs/day: 1.00    Years: 47.00    Pack years: 47.00    Types: Cigarettes    Last attempt to quit: 01/23/2007    Years since quitting: 10.3  . Smokeless tobacco: Never Used  . Tobacco comment: Smoked 1 ppd from 1961-2008, quit 01/23/2007  Substance and Sexual Activity  . Alcohol  use: No    Alcohol/week: 0.0 oz  . Drug use: No  . Sexual activity: None  Other Topics Concern  . None  Social History Narrative   Patient wants to use [facility] for diabetes testing supplies. These forms should be put directly into doctor's box.   Gevena Cotton RN    His Allergies Are:  Allergies  Allergen Reactions  . Latex Rash  :   His Current Medications Are:  Outpatient Encounter Medications as of 06/03/2017  Medication Sig  . ACCU-CHEK FASTCLIX LANCETS MISC Check blood sugar 3 times daily before meal or bedtime  . albuterol (VENTOLIN HFA) 108 (90 Base) MCG/ACT inhaler INHALE 2 PUFFS INTO  THE LUNGS EVERY 6 HOURS AS NEEDED FOR SHORTNESS OF BREATH OR WHEEZING  . aspirin (ANACIN) 81 MG EC tablet Take 1 tablet (81 mg total) by mouth daily.  . Blood Glucose Calibration (ACCU-CHEK AVIVA) SOLN   . Blood Glucose Monitoring Suppl (ACCU-CHEK GUIDE) w/Device KIT 1 each by Does not apply route 3 (three) times daily.  . canagliflozin (INVOKANA) 100 MG TABS tablet Take 1 tablet (100 mg total) by mouth daily before breakfast.  . diltiazem (CARDIZEM CD) 240 MG 24 hr capsule TAKE ONE CAPSULE BY MOUTH EVERY DAY  . glipiZIDE (GLUCOTROL) 10 MG tablet TAKE 2 TABLETS BY MOUTH EVERY MORNING TAKE 1 TABLET BY MOUTH EVERY EVENING  . glucose blood (ACCU-CHEK GUIDE) test strip Check blood sugar 3 times daily before meal or bedtime  . HYDROcodone-acetaminophen (NORCO) 7.5-325 MG tablet Take 1 tablet by mouth every 6 (six) hours as needed (for pain).  Elmore Guise Devices (ADJUSTABLE LANCING DEVICE) MISC   . lisinopril (PRINIVIL,ZESTRIL) 10 MG tablet TAKE 2 TABLETS BY MOUTH EVERY DAY  . pravastatin (PRAVACHOL) 40 MG tablet TAKE 1 TABLET BY MOUTH IN THE EVENING EVERY DAY  . primidone (MYSOLINE) 50 MG tablet TAKE 1 TABLET BY MOUTH EVERYDAY AT BEDTIME  . propranolol (INDERAL) 80 MG tablet TAKE 1 TABLET (80 MG TOTAL) BY MOUTH 2 (TWO) TIMES DAILY.  . sitaGLIPtin-metformin (JANUMET) 50-1000 MG tablet Take 1 tablet by mouth 2 (two) times daily with a meal.  . SPIRIVA HANDIHALER 18 MCG inhalation capsule PLACE 1 CAPSULE INTO INHALER AND INHALE DAILY.   No facility-administered encounter medications on file as of 06/03/2017.   :  Review of Systems:  Out of a complete 14 point review of systems, all are reviewed and negative with the exception of these symptoms as listed below: The Review of Systems  Neurological:       Patient is here to discuss tremors in both hands.    Objective:  Neurological Exam  Physical Exam Physical Examination:   Vitals:   06/03/17 1537  BP: 103/71  Pulse: 90   General  Examination: The patient is a very pleasant 66 y.o. male in no acute distress. He appears well-developed and well-nourished and well groomed.   HEENT: Normocephalic, atraumatic, pupils are equal, round and reactive to light and accommodation. Extraocular tracking is good without limitation to gaze excursion or nystagmus noted. Normal smooth pursuit is noted. Hearing is grossly intact. Face is symmetric with normal facial animation and normal facial sensation. Speech is clear with no dysarthria noted. There is no hypophonia. There is no lip, neck/head, jaw or voice tremor. Neck is supple with full range of passive and active motion. There are no carotid bruits on auscultation. Oropharynx exam reveals: mild mouth dryness, edentulous, no other issues. Tongue protrudes centrally and palate elevates  symmetrically.   Chest: Clear to auscultation without wheezing, rhonchi or crackles noted.  Heart: S1+S2+0, regular and normal without murmurs, rubs or gallops noted.   Abdomen: Soft, non-tender and non-distended with normal bowel sounds appreciated on auscultation.  Extremities: There is no pitting edema in the distal lower extremities bilaterally. Pedal pulses are intact.  Skin: Warm and dry without trophic changes noted.  Musculoskeletal: exam reveals no obvious joint deformities, tenderness or joint swelling or erythema, with the exception of bilateral knee pain, right more than left, mild medial right knee swelling noted.   Neurologically:  Mental status: The patient is awake, alert and oriented in all 4 spheres. His immediate and remote memory, attention, language skills and fund of knowledge are appropriate. There is no evidence of aphasia, agnosia, apraxia or anomia. Speech is clear with normal prosody and enunciation. Thought process is linear. Mood is normal and affect is normal.  Cranial nerves II - XII are as described above under HEENT exam. In addition: shoulder shrug is normal with equal  shoulder height noted. Motor exam: Normal bulk, strength and tone is noted. There is no drift, or rebound. Romberg is negative.   On 06/03/2017: On Archimedes spiral drawing he has mild to moderate trembling on the right and moderate trembling on the left, handwriting is tremulous, legible, not micrographic. Reflexes are 1+ in the upper extremities, trace in both knees and absent in both ankles. He has no resting tremor except for a slight right thumb tremor when activating the left hand. He has a bilateral upper extremity postural and action tremor, right more than left, no intention tremor.  Fine motor skills and coordination: intact with normal finger taps, normal hand movements, normal rapid alternating patting, normal foot taps and normal foot agility.  Cerebellar testing: No dysmetria or intention tremor on finger to nose testing. Heel to shin is unremarkable bilaterally. There is no truncal or gait ataxia.  Sensory exam: intact to light touch in the upper and lower extremities.  Gait, station and balance: He stands easily. No veering to one side is noted. No leaning to one side is noted. Posture is age-appropriate and stance is narrow based. Gait shows normal stride length and normal pace, preserved arm swing, he does have a right limp.  Assessment and Plan:   In summary, [name] is a very pleasant 66 y.o.-year old male with an underlying medical history of hypertension, poorly controlled type 2 diabetes, osteoarthritis, hypertension, hyperlipidemia, smoking, COPD, PSVT, and mildly overweight state, who presents for evaluation of his hand tremors. He has a long-standing history of tremors of several decades as he reports it. His history and physical exam are most likely in keeping with essential tremor, despite the absence of telltale family history. He has been on propranolol high dose and primidone. There is room to increase the primidone down the Road but I would not push for now.  I would like to proceed with a brain MRI for completion and to rule out a structural cause of his tremors and he also reports intermittent, albeit milder HAs. He is in agreement. We will call him with his MRI results. I suggested he continue with his current medication regimen and follow-up in 6 months, sooner as needed. We talked about circumstances and conditions that could exacerbate tremors temporarily such as dehydration, lack of sleep, caffeine, nervousness, and anxiety.  I answered all his questions today and he was in agreement with the plan.   Thank you very much for  allowing me to participate in the care of this nice patient. If I can be of any further assistance to you please do not hesitate to call me at [phone].  Sincerely,   Star Age, MD, PhD

## 2017-06-03 NOTE — Patient Instructions (Addendum)
  Please remember, that any kind of tremor may be exacerbated by anxiety, anger, nervousness, excitement, dehydration, sleep deprivation, by caffeine, and low blood sugar values or blood sugar fluctuations. Some medications, especially some antidepressants and lithium can cause or exacerbate tremors. Tremors may temporarily calm down or subside with the use of a benzodiazepine such as Valium or related medications and with alcohol. Be aware, however, that drinking alcohol is not an approved or appropriate treatment for tremor control and long-term use of benzodiazepines such as Valium, lorazepam, alprazolam, or clonazepam can cause habit formation, physical and psychological addiction. There are very few medications that symptomatically help with tremor reduction, none are without potential side effects.   We may increase your primidone down the road.  Thankfully, you have no signs of parkinsonism.   We will do a brain scan, called MRI and call you with the test results. We will have to schedule you for this on a separate date. This test requires authorization from your insurance, and we will take care of the insurance process.

## 2017-06-03 NOTE — Telephone Encounter (Signed)
I called pt. He is agreeable to rescheduling his appt until today at 3:30pm. I explained where our office is located and gave him directions. Pt verbalized understanding of new appt date and time.

## 2017-06-04 DIAGNOSIS — M1990 Unspecified osteoarthritis, unspecified site: Secondary | ICD-10-CM | POA: Diagnosis not present

## 2017-06-05 DIAGNOSIS — M1990 Unspecified osteoarthritis, unspecified site: Secondary | ICD-10-CM | POA: Diagnosis not present

## 2017-06-06 DIAGNOSIS — M1990 Unspecified osteoarthritis, unspecified site: Secondary | ICD-10-CM | POA: Diagnosis not present

## 2017-06-07 DIAGNOSIS — M1990 Unspecified osteoarthritis, unspecified site: Secondary | ICD-10-CM | POA: Diagnosis not present

## 2017-06-08 DIAGNOSIS — M1990 Unspecified osteoarthritis, unspecified site: Secondary | ICD-10-CM | POA: Diagnosis not present

## 2017-06-09 DIAGNOSIS — M1990 Unspecified osteoarthritis, unspecified site: Secondary | ICD-10-CM | POA: Diagnosis not present

## 2017-06-10 DIAGNOSIS — M1990 Unspecified osteoarthritis, unspecified site: Secondary | ICD-10-CM | POA: Diagnosis not present

## 2017-06-11 DIAGNOSIS — M1990 Unspecified osteoarthritis, unspecified site: Secondary | ICD-10-CM | POA: Diagnosis not present

## 2017-06-12 DIAGNOSIS — M1990 Unspecified osteoarthritis, unspecified site: Secondary | ICD-10-CM | POA: Diagnosis not present

## 2017-06-13 DIAGNOSIS — M1990 Unspecified osteoarthritis, unspecified site: Secondary | ICD-10-CM | POA: Diagnosis not present

## 2017-06-14 DIAGNOSIS — M1990 Unspecified osteoarthritis, unspecified site: Secondary | ICD-10-CM | POA: Diagnosis not present

## 2017-06-15 DIAGNOSIS — M1990 Unspecified osteoarthritis, unspecified site: Secondary | ICD-10-CM | POA: Diagnosis not present

## 2017-06-16 DIAGNOSIS — M1990 Unspecified osteoarthritis, unspecified site: Secondary | ICD-10-CM | POA: Diagnosis not present

## 2017-06-17 DIAGNOSIS — M1990 Unspecified osteoarthritis, unspecified site: Secondary | ICD-10-CM | POA: Diagnosis not present

## 2017-06-18 DIAGNOSIS — M1990 Unspecified osteoarthritis, unspecified site: Secondary | ICD-10-CM | POA: Diagnosis not present

## 2017-06-19 DIAGNOSIS — M1990 Unspecified osteoarthritis, unspecified site: Secondary | ICD-10-CM | POA: Diagnosis not present

## 2017-06-20 DIAGNOSIS — M1990 Unspecified osteoarthritis, unspecified site: Secondary | ICD-10-CM | POA: Diagnosis not present

## 2017-06-21 DIAGNOSIS — M1990 Unspecified osteoarthritis, unspecified site: Secondary | ICD-10-CM | POA: Diagnosis not present

## 2017-06-22 ENCOUNTER — Other Ambulatory Visit: Payer: Medicare HMO

## 2017-06-22 DIAGNOSIS — M1990 Unspecified osteoarthritis, unspecified site: Secondary | ICD-10-CM | POA: Diagnosis not present

## 2017-06-23 DIAGNOSIS — M1990 Unspecified osteoarthritis, unspecified site: Secondary | ICD-10-CM | POA: Diagnosis not present

## 2017-06-24 DIAGNOSIS — M1990 Unspecified osteoarthritis, unspecified site: Secondary | ICD-10-CM | POA: Diagnosis not present

## 2017-06-25 DIAGNOSIS — M1990 Unspecified osteoarthritis, unspecified site: Secondary | ICD-10-CM | POA: Diagnosis not present

## 2017-06-26 DIAGNOSIS — M1990 Unspecified osteoarthritis, unspecified site: Secondary | ICD-10-CM | POA: Diagnosis not present

## 2017-06-27 DIAGNOSIS — M1990 Unspecified osteoarthritis, unspecified site: Secondary | ICD-10-CM | POA: Diagnosis not present

## 2017-06-28 DIAGNOSIS — M1990 Unspecified osteoarthritis, unspecified site: Secondary | ICD-10-CM | POA: Diagnosis not present

## 2017-06-29 DIAGNOSIS — M1990 Unspecified osteoarthritis, unspecified site: Secondary | ICD-10-CM | POA: Diagnosis not present

## 2017-06-30 DIAGNOSIS — M1990 Unspecified osteoarthritis, unspecified site: Secondary | ICD-10-CM | POA: Diagnosis not present

## 2017-07-01 DIAGNOSIS — M1990 Unspecified osteoarthritis, unspecified site: Secondary | ICD-10-CM | POA: Diagnosis not present

## 2017-07-02 DIAGNOSIS — M1990 Unspecified osteoarthritis, unspecified site: Secondary | ICD-10-CM | POA: Diagnosis not present

## 2017-07-03 DIAGNOSIS — M1990 Unspecified osteoarthritis, unspecified site: Secondary | ICD-10-CM | POA: Diagnosis not present

## 2017-07-04 DIAGNOSIS — M1990 Unspecified osteoarthritis, unspecified site: Secondary | ICD-10-CM | POA: Diagnosis not present

## 2017-07-05 DIAGNOSIS — M1990 Unspecified osteoarthritis, unspecified site: Secondary | ICD-10-CM | POA: Diagnosis not present

## 2017-07-06 DIAGNOSIS — M1990 Unspecified osteoarthritis, unspecified site: Secondary | ICD-10-CM | POA: Diagnosis not present

## 2017-07-07 DIAGNOSIS — M1990 Unspecified osteoarthritis, unspecified site: Secondary | ICD-10-CM | POA: Diagnosis not present

## 2017-07-08 ENCOUNTER — Ambulatory Visit (INDEPENDENT_AMBULATORY_CARE_PROVIDER_SITE_OTHER): Payer: Medicare HMO | Admitting: Internal Medicine

## 2017-07-08 ENCOUNTER — Encounter: Payer: Self-pay | Admitting: Internal Medicine

## 2017-07-08 ENCOUNTER — Other Ambulatory Visit: Payer: Self-pay | Admitting: Internal Medicine

## 2017-07-08 ENCOUNTER — Other Ambulatory Visit: Payer: Self-pay

## 2017-07-08 VITALS — BP 104/64 | HR 92 | Temp 97.8°F | Ht 73.0 in | Wt 200.3 lb

## 2017-07-08 DIAGNOSIS — E785 Hyperlipidemia, unspecified: Secondary | ICD-10-CM

## 2017-07-08 DIAGNOSIS — G250 Essential tremor: Secondary | ICD-10-CM

## 2017-07-08 DIAGNOSIS — Z79891 Long term (current) use of opiate analgesic: Secondary | ICD-10-CM

## 2017-07-08 DIAGNOSIS — J449 Chronic obstructive pulmonary disease, unspecified: Secondary | ICD-10-CM

## 2017-07-08 DIAGNOSIS — Z7984 Long term (current) use of oral hypoglycemic drugs: Secondary | ICD-10-CM | POA: Diagnosis not present

## 2017-07-08 DIAGNOSIS — E119 Type 2 diabetes mellitus without complications: Secondary | ICD-10-CM | POA: Diagnosis not present

## 2017-07-08 DIAGNOSIS — Z87891 Personal history of nicotine dependence: Secondary | ICD-10-CM | POA: Diagnosis not present

## 2017-07-08 DIAGNOSIS — M170 Bilateral primary osteoarthritis of knee: Secondary | ICD-10-CM

## 2017-07-08 DIAGNOSIS — Z79899 Other long term (current) drug therapy: Secondary | ICD-10-CM

## 2017-07-08 DIAGNOSIS — I10 Essential (primary) hypertension: Secondary | ICD-10-CM

## 2017-07-08 DIAGNOSIS — I700 Atherosclerosis of aorta: Secondary | ICD-10-CM

## 2017-07-08 DIAGNOSIS — M549 Dorsalgia, unspecified: Secondary | ICD-10-CM

## 2017-07-08 DIAGNOSIS — M1990 Unspecified osteoarthritis, unspecified site: Secondary | ICD-10-CM

## 2017-07-08 LAB — GLUCOSE, CAPILLARY: GLUCOSE-CAPILLARY: 588 mg/dL — AB (ref 65–99)

## 2017-07-08 LAB — POCT GLYCOSYLATED HEMOGLOBIN (HGB A1C)

## 2017-07-08 MED ORDER — HYDROCODONE-ACETAMINOPHEN 7.5-325 MG PO TABS: 1.0000 | ORAL_TABLET | Freq: Four times a day (QID) | ORAL | 0 refills | Status: DC | PRN

## 2017-07-08 NOTE — Progress Notes (Signed)
   Subjective:    Patient ID: [name], male    DOB: [birth year], 67 y.o.   MRN: [id number]  3 month follow up for DM  HPI  [name] is a 67yo man with PMH of DM, back pain, HTN, HLD, COPD who presents for follow up.  Today he was noted to have a high blood sugar and A1C came back at > 14.0.  He reports that he is adherent to his 4 drug combination regimen.  He is averse to any injections and remains so.  We went over his diet and it seems he has re-introduced carbs (daily grits or biscuits), sweet (cookies) and a high amount of fruit into his diet in the last few months.  We discussed eating all of these in moderation.  He is seeing a nutritionist per him through his insurance.  He denies any blurry vision, chest pain, fatigue, nausea, vomiting today.  He does not appear ill.  BP was very well controlled at 104/27.    His BS has been an ongoing issue for [name].  I do not think he has a good understanding of how food affects his sugar and is eating as he pleases.  Hopefully communication with a nutritionist will help.   Review of Systems  Constitutional: Negative for activity change, chills and fatigue.  Eyes: Negative for photophobia and visual disturbance.  Respiratory: Negative for cough and shortness of breath.   Cardiovascular: Negative for chest pain.  Endocrine: Negative for polydipsia, polyphagia and polyuria.  Genitourinary: Negative for difficulty urinating, dysuria and enuresis.  Musculoskeletal: Positive for arthralgias and back pain.  Neurological: Negative for dizziness and weakness.       Objective:   Physical Exam  Constitutional: He is oriented to person, place, and time. He appears well-developed and well-nourished. No distress.  HENT:  Head: Normocephalic and atraumatic.  Cardiovascular: Normal rate, regular rhythm and normal heart sounds.  No murmur heard. Pulmonary/Chest: Effort normal and breath sounds normal. No respiratory distress. He has  no wheezes.  Abdominal: Soft. He exhibits no distension.  Musculoskeletal: He exhibits no edema or tenderness.  Neurological: He is alert and oriented to person, place, and time.  Psychiatric: He has a normal mood and affect. His behavior is normal.  Vitals reviewed.   BMET today.      Assessment & Plan:  RTC in 3 months in the [facility] for DM follow up.

## 2017-07-08 NOTE — Patient Instructions (Addendum)
[name] - -  Your blood sugar was high today, and your long term control of your blood sugar (A1C) was also high.  You should cut back on sweets in your diet and other carbs (grits, biscuits).  Diet recommendations below.   Today, if you begin to feel fatigued, have blurry vision, or your blood sugar continues to be very high (check today), please call the clinic back to be seen.   Please come back to the Resident clinic in 3 months for your diabetes after you make these changes to your diet.  Please take all of your diabetes medications as prescribed.    Thank you!  FOLLOW-UP INSTRUCTIONS When: 3 months For: Diabetes What to bring:  Blood sugar log, medications.       Diabetes Mellitus and Nutrition When you have diabetes (diabetes mellitus), it is very important to have healthy eating habits because your blood sugar (glucose) levels are greatly affected by what you eat and drink. Eating healthy foods in the appropriate amounts, at about the same times every day, can help you:  Control your blood glucose.  Lower your risk of heart disease.  Improve your blood pressure.  Reach or maintain a healthy weight.  Every person with diabetes is different, and each person has different needs for a meal plan. Your health care provider may recommend that you work with a diet and nutrition specialist (dietitian) to make a meal plan that is best for you. Your meal plan may vary depending on factors such as:  The calories you need.  The medicines you take.  Your weight.  Your blood glucose, blood pressure, and cholesterol levels.  Your activity level.  Other health conditions you have, such as heart or kidney disease.  How do carbohydrates affect me? Carbohydrates affect your blood glucose level more than any other type of food. Eating carbohydrates naturally increases the amount of glucose in your blood. Carbohydrate counting is a method for keeping track of how many  carbohydrates you eat. Counting carbohydrates is important to keep your blood glucose at a healthy level, especially if you use insulin or take certain oral diabetes medicines. It is important to know how many carbohydrates you can safely have in each meal. This is different for every person. Your dietitian can help you calculate how many carbohydrates you should have at each meal and for snack. Foods that contain carbohydrates include:  Bread, cereal, rice, pasta, and crackers.  Potatoes and corn.  Peas, beans, and lentils.  Milk and yogurt.  Fruit and juice.  Desserts, such as cakes, cookies, ice cream, and candy.  How does alcohol affect me? Alcohol can cause a sudden decrease in blood glucose (hypoglycemia), especially if you use insulin or take certain oral diabetes medicines. Hypoglycemia can be a life-threatening condition. Symptoms of hypoglycemia (sleepiness, dizziness, and confusion) are similar to symptoms of having too much alcohol. If your health care provider says that alcohol is safe for you, follow these guidelines:  Limit alcohol intake to no more than 1 drink per day for nonpregnant women and 2 drinks per day for men. One drink equals 12 oz of beer, 5 oz of wine, or 1 oz of hard liquor.  Do not drink on an empty stomach.  Keep yourself hydrated with water, diet soda, or unsweetened iced tea.  Keep in mind that regular soda, juice, and other mixers may contain a lot of sugar and must be counted as carbohydrates.  What are tips for following this  plan? Reading food labels  Start by checking the serving size on the label. The amount of calories, carbohydrates, fats, and other nutrients listed on the label are based on one serving of the food. Many foods contain more than one serving per package.  Check the total grams (g) of carbohydrates in one serving. You can calculate the number of servings of carbohydrates in one serving by dividing the total carbohydrates by 15.  For example, if a food has 30 g of total carbohydrates, it would be equal to 2 servings of carbohydrates.  Check the number of grams (g) of saturated and trans fats in one serving. Choose foods that have low or no amount of these fats.  Check the number of milligrams (mg) of sodium in one serving. Most people should limit total sodium intake to less than 2,300 mg per day.  Always check the nutrition information of foods labeled as "low-fat" or "nonfat". These foods may be higher in added sugar or refined carbohydrates and should be avoided.  Talk to your dietitian to identify your daily goals for nutrients listed on the label. Shopping  Avoid buying canned, premade, or processed foods. These foods tend to be high in fat, sodium, and added sugar.  Shop around the outside edge of the grocery store. This includes fresh fruits and vegetables, bulk grains, fresh meats, and fresh dairy. Cooking  Use low-heat cooking methods, such as baking, instead of high-heat cooking methods like deep frying.  Cook using healthy oils, such as olive, canola, or sunflower oil.  Avoid cooking with butter, cream, or high-fat meats. Meal planning  Eat meals and snacks regularly, preferably at the same times every day. Avoid going long periods of time without eating.  Eat foods high in fiber, such as fresh fruits, vegetables, beans, and whole grains. Talk to your dietitian about how many servings of carbohydrates you can eat at each meal.  Eat 4-6 ounces of lean protein each day, such as lean meat, chicken, fish, eggs, or tofu. 1 ounce is equal to 1 ounce of meat, chicken, or fish, 1 egg, or 1/4 cup of tofu.  Eat some foods each day that contain healthy fats, such as avocado, nuts, seeds, and fish. Lifestyle   Check your blood glucose regularly.  Exercise at least 30 minutes 5 or more days each week, or as told by your health care provider.  Take medicines as told by your health care provider.  Do not  use any products that contain nicotine or tobacco, such as cigarettes and e-cigarettes. If you need help quitting, ask your health care provider.  Work with a Social worker or diabetes educator to identify strategies to manage stress and any emotional and social challenges. What are some questions to ask my health care provider?  Do I need to meet with a diabetes educator?  Do I need to meet with a dietitian?  What number can I call if I have questions?  When are the best times to check my blood glucose? Where to find more information:  American Diabetes Association: diabetes.org/food-and-fitness/food  Academy of Nutrition and Dietetics: PokerClues.dk  [employer] of Diabetes and Digestive and Kidney Diseases (NIH): ContactWire.be Summary  A healthy meal plan will help you control your blood glucose and maintain a healthy lifestyle.  Working with a diet and nutrition specialist (dietitian) can help you make a meal plan that is best for you.  Keep in mind that carbohydrates and alcohol have immediate effects on your blood glucose levels.  It is important to count carbohydrates and to use alcohol carefully. This information is not intended to replace advice given to you by your health care provider. Make sure you discuss any questions you have with your health care provider. Document Released: 03/06/2005 Document Revised: 07/14/2016 Document Reviewed: 07/14/2016 Elsevier Interactive Patient Education  [employer].

## 2017-07-09 ENCOUNTER — Telehealth: Payer: Self-pay | Admitting: Internal Medicine

## 2017-07-09 ENCOUNTER — Encounter: Payer: Self-pay | Admitting: Internal Medicine

## 2017-07-09 DIAGNOSIS — M1990 Unspecified osteoarthritis, unspecified site: Secondary | ICD-10-CM | POA: Diagnosis not present

## 2017-07-09 DIAGNOSIS — I10 Essential (primary) hypertension: Secondary | ICD-10-CM

## 2017-07-09 LAB — BMP8+ANION GAP
Anion Gap: 18 mmol/L (ref 10.0–18.0)
BUN/Creatinine Ratio: 14 (ref 10–24)
BUN: 16 mg/dL (ref 8–27)
CALCIUM: 9.7 mg/dL (ref 8.6–10.2)
CHLORIDE: 94 mmol/L — AB (ref 96–106)
CO2: 21 mmol/L (ref 20–29)
Creatinine, Ser: 1.18 mg/dL (ref 0.76–1.27)
GFR calc non Af Amer: 63 mL/min/{1.73_m2} (ref >59)
GFR, EST AFRICAN AMERICAN: 73 mL/min/{1.73_m2} (ref >59)
GLUCOSE: 603 mg/dL — AB (ref 65–99)
POTASSIUM: 5.5 mmol/L — AB (ref 3.5–5.2)
Sodium: 133 mmol/L — ABNORMAL LOW (ref 134–144)

## 2017-07-09 NOTE — Assessment & Plan Note (Signed)
Mild and seen on CT scan of the abdomen a few years ago.  He has no abdominal pain, no chest pain, no signs/symptoms of worsening.  Continue to monitor.  He will need good BP and DM control.

## 2017-07-09 NOTE — Assessment & Plan Note (Signed)
This was the main thing we talked about today.  His A1C has worsened to > 14.0.  He is on a 4 drug regimen, but is worsening.  We reviewed his diet and he has relaxed his diet and is eating cookies, lots of fruit and carbs.  He seems to have little understanding of how food affects his DM.  He reports compliance with his medications.  He has no polyuria or polydipsia, but does note he likes to drink water.  His BP and LDL are well controlled.  He has an appointment with an eye doctor in February.  He has started seeing a nutritionist through his insurance.  He was very surprised by how bad it was.  He seemed motivated to try diet control.  He is adamantly against any injectables.  I advised him to see MNT here in our clinic, but he declined.  He declined any change to his medications.    For now, we will continue his four drug regimen.  I spent 15 - 20 minutes discussing appropriate changes to his diet with him.  He will discuss with a nutritionist.  He will be seen back in 3 months.  I advised him how important it was to get this under control and he seemed to understand

## 2017-07-09 NOTE — Assessment & Plan Note (Signed)
BP was well controlled today at 104/64.  He is on diltiazem, lisinopril, propranolol.  Pulse was 92.    Plan Given elevated K on blood work, will ask patient to hold lisinopril and recheck blood work in 1 week.

## 2017-07-09 NOTE — Telephone Encounter (Signed)
Called patient about blood work.   K is mildly high.  He reports not taking any vitamins or supplements, but he has been eating more fruit lately.   I have asked him to stop his lisinopril and to come in to get blood work on Monday or Tuesday next week and to call the clinic for any changes to his health which he agreed to.    Glenda - - If he does not come in [place] or Tues (pending weather) could you call him to remind him next week?   Thanks

## 2017-07-09 NOTE — Assessment & Plan Note (Signed)
Well controlled with current pain management regimen.  He has no constipation.  [place] narcotic database reviewed.  UDS at next visit.

## 2017-07-10 DIAGNOSIS — M1990 Unspecified osteoarthritis, unspecified site: Secondary | ICD-10-CM | POA: Diagnosis not present

## 2017-07-11 DIAGNOSIS — M1990 Unspecified osteoarthritis, unspecified site: Secondary | ICD-10-CM | POA: Diagnosis not present

## 2017-07-12 DIAGNOSIS — M1990 Unspecified osteoarthritis, unspecified site: Secondary | ICD-10-CM | POA: Diagnosis not present

## 2017-07-13 DIAGNOSIS — M1990 Unspecified osteoarthritis, unspecified site: Secondary | ICD-10-CM | POA: Diagnosis not present

## 2017-07-14 DIAGNOSIS — M1990 Unspecified osteoarthritis, unspecified site: Secondary | ICD-10-CM | POA: Diagnosis not present

## 2017-07-15 DIAGNOSIS — M1990 Unspecified osteoarthritis, unspecified site: Secondary | ICD-10-CM | POA: Diagnosis not present

## 2017-07-16 DIAGNOSIS — M1990 Unspecified osteoarthritis, unspecified site: Secondary | ICD-10-CM | POA: Diagnosis not present

## 2017-07-16 NOTE — Telephone Encounter (Signed)
Talked to pt - informed high K+ level and need to repeat per Dr Daryll Drown. Stated he can come tomorrow @ 1000 AM.

## 2017-07-16 NOTE — Telephone Encounter (Signed)
Thank you so much

## 2017-07-17 ENCOUNTER — Other Ambulatory Visit (INDEPENDENT_AMBULATORY_CARE_PROVIDER_SITE_OTHER): Payer: Medicare HMO

## 2017-07-17 DIAGNOSIS — M1990 Unspecified osteoarthritis, unspecified site: Secondary | ICD-10-CM | POA: Diagnosis not present

## 2017-07-17 DIAGNOSIS — I10 Essential (primary) hypertension: Secondary | ICD-10-CM | POA: Diagnosis not present

## 2017-07-17 LAB — BASIC METABOLIC PANEL
Anion gap: 10 (ref 5–15)
BUN: 10 mg/dL (ref 6–20)
CHLORIDE: 99 mmol/L — AB (ref 101–111)
CO2: 25 mmol/L (ref 22–32)
CREATININE: 1.02 mg/dL (ref 0.61–1.24)
Calcium: 9.6 mg/dL (ref 8.9–10.3)
GFR calc Af Amer: >60 mL/min (ref >60)
GFR calc non Af Amer: >60 mL/min (ref >60)
GLUCOSE: 383 mg/dL — AB (ref 65–99)
Potassium: 5.1 mmol/L (ref 3.5–5.1)
Sodium: 134 mmol/L — ABNORMAL LOW (ref 135–145)

## 2017-07-18 DIAGNOSIS — M1990 Unspecified osteoarthritis, unspecified site: Secondary | ICD-10-CM | POA: Diagnosis not present

## 2017-07-19 DIAGNOSIS — M1990 Unspecified osteoarthritis, unspecified site: Secondary | ICD-10-CM | POA: Diagnosis not present

## 2017-07-20 ENCOUNTER — Telehealth: Payer: Self-pay | Admitting: Internal Medicine

## 2017-07-20 DIAGNOSIS — M1990 Unspecified osteoarthritis, unspecified site: Secondary | ICD-10-CM | POA: Diagnosis not present

## 2017-07-20 NOTE — Telephone Encounter (Signed)
Called patient to advise him that his K had improved. He should continue to hold lisinopril.   I asked him to make an appointment in 1-2 weeks in [facility] to get his BP and BMET checked.    Gilles Chiquito, MD

## 2017-07-21 DIAGNOSIS — M1990 Unspecified osteoarthritis, unspecified site: Secondary | ICD-10-CM | POA: Diagnosis not present

## 2017-07-22 DIAGNOSIS — M1990 Unspecified osteoarthritis, unspecified site: Secondary | ICD-10-CM | POA: Diagnosis not present

## 2017-07-23 DIAGNOSIS — M1990 Unspecified osteoarthritis, unspecified site: Secondary | ICD-10-CM | POA: Diagnosis not present

## 2017-07-24 DIAGNOSIS — M1990 Unspecified osteoarthritis, unspecified site: Secondary | ICD-10-CM | POA: Diagnosis not present

## 2017-07-25 DIAGNOSIS — M1990 Unspecified osteoarthritis, unspecified site: Secondary | ICD-10-CM | POA: Diagnosis not present

## 2017-07-26 DIAGNOSIS — M1990 Unspecified osteoarthritis, unspecified site: Secondary | ICD-10-CM | POA: Diagnosis not present

## 2017-07-27 DIAGNOSIS — M1990 Unspecified osteoarthritis, unspecified site: Secondary | ICD-10-CM | POA: Diagnosis not present

## 2017-07-28 DIAGNOSIS — H40013 Open angle with borderline findings, low risk, bilateral: Secondary | ICD-10-CM | POA: Diagnosis not present

## 2017-07-28 DIAGNOSIS — E119 Type 2 diabetes mellitus without complications: Secondary | ICD-10-CM | POA: Diagnosis not present

## 2017-07-28 DIAGNOSIS — H2513 Age-related nuclear cataract, bilateral: Secondary | ICD-10-CM | POA: Diagnosis not present

## 2017-07-28 DIAGNOSIS — M1990 Unspecified osteoarthritis, unspecified site: Secondary | ICD-10-CM | POA: Diagnosis not present

## 2017-07-28 DIAGNOSIS — H35033 Hypertensive retinopathy, bilateral: Secondary | ICD-10-CM | POA: Diagnosis not present

## 2017-07-29 DIAGNOSIS — M1990 Unspecified osteoarthritis, unspecified site: Secondary | ICD-10-CM | POA: Diagnosis not present

## 2017-07-30 DIAGNOSIS — M1990 Unspecified osteoarthritis, unspecified site: Secondary | ICD-10-CM | POA: Diagnosis not present

## 2017-07-31 ENCOUNTER — Other Ambulatory Visit: Payer: Self-pay

## 2017-07-31 ENCOUNTER — Ambulatory Visit (INDEPENDENT_AMBULATORY_CARE_PROVIDER_SITE_OTHER): Payer: Medicare HMO | Admitting: Internal Medicine

## 2017-07-31 ENCOUNTER — Encounter: Payer: Self-pay | Admitting: Internal Medicine

## 2017-07-31 VITALS — BP 140/86 | HR 80 | Temp 98.6°F | Ht 73.0 in | Wt 197.9 lb

## 2017-07-31 DIAGNOSIS — Z79899 Other long term (current) drug therapy: Secondary | ICD-10-CM

## 2017-07-31 DIAGNOSIS — G250 Essential tremor: Secondary | ICD-10-CM

## 2017-07-31 DIAGNOSIS — I10 Essential (primary) hypertension: Secondary | ICD-10-CM | POA: Diagnosis not present

## 2017-07-31 DIAGNOSIS — Z87891 Personal history of nicotine dependence: Secondary | ICD-10-CM | POA: Diagnosis not present

## 2017-07-31 DIAGNOSIS — E1140 Type 2 diabetes mellitus with diabetic neuropathy, unspecified: Secondary | ICD-10-CM

## 2017-07-31 DIAGNOSIS — M1990 Unspecified osteoarthritis, unspecified site: Secondary | ICD-10-CM | POA: Diagnosis not present

## 2017-07-31 DIAGNOSIS — E875 Hyperkalemia: Secondary | ICD-10-CM

## 2017-07-31 NOTE — Progress Notes (Signed)
Internal Medicine Clinic Attending  Case discussed with Dr. Georgie Chard the time of the visit. We reviewed the resident's history and exam and pertinent patient test results. I agree with the assessment, diagnosis, and plan of care documented in the resident's note. BP when on home meds of Dilt, lisinopril, and propranolol consistently 104 ish / 60-70. If we are able to resume ACE I, which he needs 2/2 DM nephropathy, and add HCTZ (to help control K) he will likely get hypotensive. If we can resume ACE, I would rec stopping BB as notes indicate he is not getting intended benefit.

## 2017-07-31 NOTE — Patient Instructions (Addendum)
[name],   I will call you to let you know the results of your blood work.  We might need to change your medication depending on what the blood work shows.  I will let you know when you have to come back.  Please call us if you have any questions.

## 2017-07-31 NOTE — Assessment & Plan Note (Addendum)
Patient presents for hypertension follow-up.  Currently takes diltiazem 240 mg QD, lisinopril 20 mg QD, and propanolol 50 BID (for essential tremor). Lisinopril held 1/17 secondary to hyperkalemia (K 5.5.). Repeat BMP on 1/24 with mild improvement, K 5.1. Patient instructed to hold lisinopril until seen today. Today BP is 140/86. Denies headache, vision changes, chest pain, shortness of breath, and LE swelling.   - Continue to hold lisinorpril  - BMP today. Will call patient with results. If K in normal range will start HCTZ 12.5 mg and have him follow up in 4 weeks for BP recheck and repeat BMP   ADDENDUM: K 5.0. Called patient to discuss results. Will stop lisinopril at this time given persistent hyperkalemia. Will add HCTZ 12.5 mg QD. Prescription sent to patient's pharmacy. Asked patient to return to clinic in 4 weeks for BP recheck and BMP. He verbalized understanding.

## 2017-07-31 NOTE — Progress Notes (Signed)
   CC: HTN follow up   HPI:  [name] is a 67 y.o. male with PMH listed below who presents to clinic for hypertension follow-up.  Please see problem based assessment and plan for further details.  Past Medical History:  Diagnosis Date  . Baker's cyst, ruptured   . COPD (chronic obstructive pulmonary disease) ([place])   . Diabetes mellitus, type 2 ([place])   . Erectile dysfunction   . History of tobacco abuse    Year started: 82   Year quit: 01/2007  . Hyperlipidemia   . Hypertension   . Knee osteoarthritis    Bilateral,   Patient has been evaluated by Ortho for knee replacement in the past  . Lipoma of skin   . Osteoarthritis   . PSVT (paroxysmal supraventricular tachycardia) ([place])    Required adenosine 2006  . Trochanteric bursitis of left hip    Review of Systems:   Review of Systems  Constitutional: Negative for chills, fever, malaise/fatigue and weight loss.  Eyes: Negative for blurred vision and double vision.  Respiratory: Negative for shortness of breath.   Cardiovascular: Negative for chest pain, palpitations and leg swelling.  Neurological: Negative for dizziness and headaches.    Physical Exam:  Vitals:   07/31/17 1413  BP: 140/86  Pulse: 80  Temp: 98.6 F (37 C)  TempSrc: Oral  SpO2: 99%  Weight: 197 lb 14.4 oz (89.8 kg)  Height: 6\' 1"  (1.854 m)    General: elderly male, well-nourished, well-developed, in no acute distress  Cardiac: regular rate and rhythm, nl S1/S2, no murmurs, rubs or gallops  Pulm: CTAB, no wheezes or crackles, no increased work of breathing  Abd: soft, NTND, bowel sounds present  Ext: warm and well perfused, no peripheral edema    Assessment & Plan:   See Encounters Tab for problem based charting.  Patient discussed with Dr. Lynnae January

## 2017-08-01 DIAGNOSIS — M1990 Unspecified osteoarthritis, unspecified site: Secondary | ICD-10-CM | POA: Diagnosis not present

## 2017-08-01 LAB — BMP8+ANION GAP
Anion Gap: 17 mmol/L (ref 10.0–18.0)
BUN/Creatinine Ratio: 13 (ref 10–24)
BUN: 14 mg/dL (ref 8–27)
CO2: 21 mmol/L (ref 20–29)
CREATININE: 1.09 mg/dL (ref 0.76–1.27)
Calcium: 9.6 mg/dL (ref 8.6–10.2)
Chloride: 101 mmol/L (ref 96–106)
GFR calc non Af Amer: 70 mL/min/{1.73_m2} (ref >59)
GFR, EST AFRICAN AMERICAN: 81 mL/min/{1.73_m2} (ref >59)
Glucose: 390 mg/dL — ABNORMAL HIGH (ref 65–99)
Potassium: 5 mmol/L (ref 3.5–5.2)
Sodium: 139 mmol/L (ref 134–144)

## 2017-08-02 DIAGNOSIS — M1990 Unspecified osteoarthritis, unspecified site: Secondary | ICD-10-CM | POA: Diagnosis not present

## 2017-08-03 DIAGNOSIS — M1990 Unspecified osteoarthritis, unspecified site: Secondary | ICD-10-CM | POA: Diagnosis not present

## 2017-08-03 MED ORDER — HYDROCHLOROTHIAZIDE 12.5 MG PO CAPS: 12.5000 mg | ORAL_CAPSULE | Freq: Every day | ORAL | 1 refills | Status: DC

## 2017-08-03 NOTE — Addendum Note (Signed)
Addended by: Welford Roche on: 08/03/2017 04:26 PM   Modules accepted: Orders

## 2017-08-04 DIAGNOSIS — M1990 Unspecified osteoarthritis, unspecified site: Secondary | ICD-10-CM | POA: Diagnosis not present

## 2017-08-05 DIAGNOSIS — M1990 Unspecified osteoarthritis, unspecified site: Secondary | ICD-10-CM | POA: Diagnosis not present

## 2017-08-06 ENCOUNTER — Other Ambulatory Visit: Payer: Self-pay | Admitting: Internal Medicine

## 2017-08-06 DIAGNOSIS — E119 Type 2 diabetes mellitus without complications: Secondary | ICD-10-CM

## 2017-08-06 DIAGNOSIS — M1990 Unspecified osteoarthritis, unspecified site: Secondary | ICD-10-CM | POA: Diagnosis not present

## 2017-08-07 ENCOUNTER — Encounter: Payer: Self-pay | Admitting: Internal Medicine

## 2017-08-07 DIAGNOSIS — M1990 Unspecified osteoarthritis, unspecified site: Secondary | ICD-10-CM | POA: Diagnosis not present

## 2017-08-07 DIAGNOSIS — E1351 Other specified diabetes mellitus with diabetic peripheral angiopathy without gangrene: Secondary | ICD-10-CM | POA: Diagnosis not present

## 2017-08-07 DIAGNOSIS — E663 Overweight: Secondary | ICD-10-CM | POA: Insufficient documentation

## 2017-08-07 DIAGNOSIS — B351 Tinea unguium: Secondary | ICD-10-CM | POA: Diagnosis not present

## 2017-08-08 DIAGNOSIS — M1990 Unspecified osteoarthritis, unspecified site: Secondary | ICD-10-CM | POA: Diagnosis not present

## 2017-08-09 DIAGNOSIS — M1990 Unspecified osteoarthritis, unspecified site: Secondary | ICD-10-CM | POA: Diagnosis not present

## 2017-08-10 ENCOUNTER — Other Ambulatory Visit: Payer: Self-pay | Admitting: Internal Medicine

## 2017-08-10 DIAGNOSIS — J449 Chronic obstructive pulmonary disease, unspecified: Secondary | ICD-10-CM

## 2017-08-10 DIAGNOSIS — M1990 Unspecified osteoarthritis, unspecified site: Secondary | ICD-10-CM | POA: Diagnosis not present

## 2017-08-11 DIAGNOSIS — M1990 Unspecified osteoarthritis, unspecified site: Secondary | ICD-10-CM | POA: Diagnosis not present

## 2017-08-12 DIAGNOSIS — M1990 Unspecified osteoarthritis, unspecified site: Secondary | ICD-10-CM | POA: Diagnosis not present

## 2017-08-13 DIAGNOSIS — M1990 Unspecified osteoarthritis, unspecified site: Secondary | ICD-10-CM | POA: Diagnosis not present

## 2017-08-14 DIAGNOSIS — M1990 Unspecified osteoarthritis, unspecified site: Secondary | ICD-10-CM | POA: Diagnosis not present

## 2017-08-15 DIAGNOSIS — M1990 Unspecified osteoarthritis, unspecified site: Secondary | ICD-10-CM | POA: Diagnosis not present

## 2017-08-16 DIAGNOSIS — M1990 Unspecified osteoarthritis, unspecified site: Secondary | ICD-10-CM | POA: Diagnosis not present

## 2017-08-17 DIAGNOSIS — M1990 Unspecified osteoarthritis, unspecified site: Secondary | ICD-10-CM | POA: Diagnosis not present

## 2017-08-18 DIAGNOSIS — M1990 Unspecified osteoarthritis, unspecified site: Secondary | ICD-10-CM | POA: Diagnosis not present

## 2017-08-19 DIAGNOSIS — M1990 Unspecified osteoarthritis, unspecified site: Secondary | ICD-10-CM | POA: Diagnosis not present

## 2017-08-20 DIAGNOSIS — M1990 Unspecified osteoarthritis, unspecified site: Secondary | ICD-10-CM | POA: Diagnosis not present

## 2017-08-21 DIAGNOSIS — M1990 Unspecified osteoarthritis, unspecified site: Secondary | ICD-10-CM | POA: Diagnosis not present

## 2017-08-22 DIAGNOSIS — M1990 Unspecified osteoarthritis, unspecified site: Secondary | ICD-10-CM | POA: Diagnosis not present

## 2017-08-23 DIAGNOSIS — M1990 Unspecified osteoarthritis, unspecified site: Secondary | ICD-10-CM | POA: Diagnosis not present

## 2017-08-24 DIAGNOSIS — M1990 Unspecified osteoarthritis, unspecified site: Secondary | ICD-10-CM | POA: Diagnosis not present

## 2017-08-25 DIAGNOSIS — M1990 Unspecified osteoarthritis, unspecified site: Secondary | ICD-10-CM | POA: Diagnosis not present

## 2017-08-26 DIAGNOSIS — M1990 Unspecified osteoarthritis, unspecified site: Secondary | ICD-10-CM | POA: Diagnosis not present

## 2017-08-27 DIAGNOSIS — M1990 Unspecified osteoarthritis, unspecified site: Secondary | ICD-10-CM | POA: Diagnosis not present

## 2017-08-28 DIAGNOSIS — M1990 Unspecified osteoarthritis, unspecified site: Secondary | ICD-10-CM | POA: Diagnosis not present

## 2017-08-29 DIAGNOSIS — M1990 Unspecified osteoarthritis, unspecified site: Secondary | ICD-10-CM | POA: Diagnosis not present

## 2017-08-30 DIAGNOSIS — M1990 Unspecified osteoarthritis, unspecified site: Secondary | ICD-10-CM | POA: Diagnosis not present

## 2017-08-31 DIAGNOSIS — M1990 Unspecified osteoarthritis, unspecified site: Secondary | ICD-10-CM | POA: Diagnosis not present

## 2017-09-01 DIAGNOSIS — M1990 Unspecified osteoarthritis, unspecified site: Secondary | ICD-10-CM | POA: Diagnosis not present

## 2017-09-02 DIAGNOSIS — M1990 Unspecified osteoarthritis, unspecified site: Secondary | ICD-10-CM | POA: Diagnosis not present

## 2017-09-03 DIAGNOSIS — M1990 Unspecified osteoarthritis, unspecified site: Secondary | ICD-10-CM | POA: Diagnosis not present

## 2017-09-04 DIAGNOSIS — M1990 Unspecified osteoarthritis, unspecified site: Secondary | ICD-10-CM | POA: Diagnosis not present

## 2017-09-05 DIAGNOSIS — M1990 Unspecified osteoarthritis, unspecified site: Secondary | ICD-10-CM | POA: Diagnosis not present

## 2017-09-06 DIAGNOSIS — M1990 Unspecified osteoarthritis, unspecified site: Secondary | ICD-10-CM | POA: Diagnosis not present

## 2017-09-07 DIAGNOSIS — M1990 Unspecified osteoarthritis, unspecified site: Secondary | ICD-10-CM | POA: Diagnosis not present

## 2017-09-08 DIAGNOSIS — M1990 Unspecified osteoarthritis, unspecified site: Secondary | ICD-10-CM | POA: Diagnosis not present

## 2017-09-09 DIAGNOSIS — M1990 Unspecified osteoarthritis, unspecified site: Secondary | ICD-10-CM | POA: Diagnosis not present

## 2017-09-10 DIAGNOSIS — M1990 Unspecified osteoarthritis, unspecified site: Secondary | ICD-10-CM | POA: Diagnosis not present

## 2017-09-11 DIAGNOSIS — M1990 Unspecified osteoarthritis, unspecified site: Secondary | ICD-10-CM | POA: Diagnosis not present

## 2017-09-12 DIAGNOSIS — M1990 Unspecified osteoarthritis, unspecified site: Secondary | ICD-10-CM | POA: Diagnosis not present

## 2017-09-13 DIAGNOSIS — M1990 Unspecified osteoarthritis, unspecified site: Secondary | ICD-10-CM | POA: Diagnosis not present

## 2017-09-14 DIAGNOSIS — M1990 Unspecified osteoarthritis, unspecified site: Secondary | ICD-10-CM | POA: Diagnosis not present

## 2017-09-15 DIAGNOSIS — M1990 Unspecified osteoarthritis, unspecified site: Secondary | ICD-10-CM | POA: Diagnosis not present

## 2017-09-16 DIAGNOSIS — M1990 Unspecified osteoarthritis, unspecified site: Secondary | ICD-10-CM | POA: Diagnosis not present

## 2017-09-17 DIAGNOSIS — M1990 Unspecified osteoarthritis, unspecified site: Secondary | ICD-10-CM | POA: Diagnosis not present

## 2017-09-18 DIAGNOSIS — M1990 Unspecified osteoarthritis, unspecified site: Secondary | ICD-10-CM | POA: Diagnosis not present

## 2017-09-19 DIAGNOSIS — M1990 Unspecified osteoarthritis, unspecified site: Secondary | ICD-10-CM | POA: Diagnosis not present

## 2017-09-20 DIAGNOSIS — M1990 Unspecified osteoarthritis, unspecified site: Secondary | ICD-10-CM | POA: Diagnosis not present

## 2017-09-21 DIAGNOSIS — M1990 Unspecified osteoarthritis, unspecified site: Secondary | ICD-10-CM | POA: Diagnosis not present

## 2017-09-22 ENCOUNTER — Telehealth: Payer: Self-pay | Admitting: Internal Medicine

## 2017-09-22 DIAGNOSIS — M1990 Unspecified osteoarthritis, unspecified site: Secondary | ICD-10-CM | POA: Diagnosis not present

## 2017-09-22 NOTE — Telephone Encounter (Signed)
Just attempted to contact patient to schedule an appointment this week, but no answer.  Left detailed message asking to call me back.

## 2017-09-22 NOTE — Telephone Encounter (Signed)
Needs [facility] appt this week BP and BMP, refer to last ACC note

## 2017-09-23 DIAGNOSIS — M1990 Unspecified osteoarthritis, unspecified site: Secondary | ICD-10-CM | POA: Diagnosis not present

## 2017-09-24 DIAGNOSIS — M1990 Unspecified osteoarthritis, unspecified site: Secondary | ICD-10-CM | POA: Diagnosis not present

## 2017-09-25 DIAGNOSIS — M1990 Unspecified osteoarthritis, unspecified site: Secondary | ICD-10-CM | POA: Diagnosis not present

## 2017-09-26 DIAGNOSIS — M1990 Unspecified osteoarthritis, unspecified site: Secondary | ICD-10-CM | POA: Diagnosis not present

## 2017-09-27 DIAGNOSIS — M1990 Unspecified osteoarthritis, unspecified site: Secondary | ICD-10-CM | POA: Diagnosis not present

## 2017-09-28 DIAGNOSIS — M1990 Unspecified osteoarthritis, unspecified site: Secondary | ICD-10-CM | POA: Diagnosis not present

## 2017-09-29 DIAGNOSIS — M1990 Unspecified osteoarthritis, unspecified site: Secondary | ICD-10-CM | POA: Diagnosis not present

## 2017-09-30 ENCOUNTER — Other Ambulatory Visit: Payer: Self-pay | Admitting: Internal Medicine

## 2017-09-30 DIAGNOSIS — I10 Essential (primary) hypertension: Secondary | ICD-10-CM

## 2017-09-30 DIAGNOSIS — M1990 Unspecified osteoarthritis, unspecified site: Secondary | ICD-10-CM | POA: Diagnosis not present

## 2017-10-01 DIAGNOSIS — M1990 Unspecified osteoarthritis, unspecified site: Secondary | ICD-10-CM | POA: Diagnosis not present

## 2017-10-02 DIAGNOSIS — M1990 Unspecified osteoarthritis, unspecified site: Secondary | ICD-10-CM | POA: Diagnosis not present

## 2017-10-03 DIAGNOSIS — M1990 Unspecified osteoarthritis, unspecified site: Secondary | ICD-10-CM | POA: Diagnosis not present

## 2017-10-04 DIAGNOSIS — M1990 Unspecified osteoarthritis, unspecified site: Secondary | ICD-10-CM | POA: Diagnosis not present

## 2017-10-05 DIAGNOSIS — M1990 Unspecified osteoarthritis, unspecified site: Secondary | ICD-10-CM | POA: Diagnosis not present

## 2017-10-06 DIAGNOSIS — M1990 Unspecified osteoarthritis, unspecified site: Secondary | ICD-10-CM | POA: Diagnosis not present

## 2017-10-07 DIAGNOSIS — M1990 Unspecified osteoarthritis, unspecified site: Secondary | ICD-10-CM | POA: Diagnosis not present

## 2017-10-08 ENCOUNTER — Ambulatory Visit (INDEPENDENT_AMBULATORY_CARE_PROVIDER_SITE_OTHER): Payer: Medicare HMO | Admitting: Internal Medicine

## 2017-10-08 ENCOUNTER — Other Ambulatory Visit: Payer: Self-pay

## 2017-10-08 ENCOUNTER — Other Ambulatory Visit: Payer: Self-pay | Admitting: Internal Medicine

## 2017-10-08 ENCOUNTER — Encounter (INDEPENDENT_AMBULATORY_CARE_PROVIDER_SITE_OTHER): Payer: Self-pay

## 2017-10-08 VITALS — BP 118/70 | HR 82 | Temp 98.0°F | Ht 73.0 in | Wt 187.5 lb

## 2017-10-08 DIAGNOSIS — I10 Essential (primary) hypertension: Secondary | ICD-10-CM

## 2017-10-08 DIAGNOSIS — M1990 Unspecified osteoarthritis, unspecified site: Secondary | ICD-10-CM | POA: Diagnosis not present

## 2017-10-08 DIAGNOSIS — Z79899 Other long term (current) drug therapy: Secondary | ICD-10-CM | POA: Diagnosis not present

## 2017-10-08 DIAGNOSIS — H9202 Otalgia, left ear: Secondary | ICD-10-CM | POA: Diagnosis not present

## 2017-10-08 DIAGNOSIS — G250 Essential tremor: Secondary | ICD-10-CM

## 2017-10-08 NOTE — Patient Instructions (Signed)
It was good seeing you today. I'm glad you are feeling well. It looks like your ear is healing nicely.  I need you to return to clinic early next week to check your potassium levels!

## 2017-10-08 NOTE — Assessment & Plan Note (Addendum)
Assessment: Chart review shows pt was contacted 2-weeks ago for an appointment to evaluate his BP and repeat BMET. He was seen most recently 07/2017 and seems that he developed transient hyperkalemia which resolved after holding Lisinopril. He was instead started on HCTZ 12.5mg  and asked to return for repeat BMET in 2 weeks but did not show. He has continued holding Lisinopril and denied any issues, including dizziness, with HCTZ. BP today 104/65 and 118/70 on repeat.   Plan: Patient was very eager to leave today due to concern over his Fiance who is apparently sick. He received several phone-calls from her during our exam. He was unaware he needed to have follow-up labs from his visit in February. I asked patient to get BMET prior to leaving but declined due to the urgency of the matter. I requested that he RTC for lab visit early next week to follow-up his potassium. -Standing order for BMET to be completed next week -Will recruit front-desk staff to remind patient of requested lab

## 2017-10-08 NOTE — Assessment & Plan Note (Signed)
Assessment & Plan: [name] presented today requesting evaluation of left-ear pain of 1-week duration. Notes development of a small, painful "pimple" in the upper part of the concha which he's been treating with soap, water and daily peroxide with improvement. He denies any hearing loss, inner ear pain or fullness, drainage or burning sensation. Notes history of similar events occasionally, but usually resolve within 4-5 days vs 7-8.  Exam shows well-healing lesion on upper concha without purulence, erythema or drainage. Otoscopic exam was wnl although did have moderate cerumen.  -Encouraged patient to continue keeping the area clean and dry, but to hold-off on any further peroxide applications -Pt to RTC should symptoms worsen

## 2017-10-08 NOTE — Progress Notes (Signed)
   CC: Left ear pain  HPI:  [name] is a 67 y.o. M who presents to [facility] today requesting evaluation for a 1-week history of ear pain. Noticed a "pimple" about a week ago which he has been cleaning with soap, water and also peroxide. He notes it has nearly resolved, but did want a provider to look at it. He notes he is in a rush as his fiance is sick and needs to go see her.   For details regarding today's visit and the status of their chronic medical issues, please refer to the assessment and plan.  Past Medical History:  Diagnosis Date  . Baker's cyst, ruptured   . COPD (chronic obstructive pulmonary disease) ([place])   . Diabetes mellitus, type 2 ([place])   . Erectile dysfunction   . History of tobacco abuse    Year started: 67   Year quit: 01/2007  . Hyperlipidemia   . Hypertension   . Knee osteoarthritis    Bilateral,   Patient has been evaluated by Ortho for knee replacement in the past  . Lipoma of skin   . Osteoarthritis   . PSVT (paroxysmal supraventricular tachycardia) ([place])    Required adenosine 2006  . Trochanteric bursitis of left hip    Review of Systems:   General: Denies fevers, chills HEENT: +Ear pain. Denies ear fullness, ear discharge, hearing loss, sore throat, dysphagia Cardiac: Denies CP, SOB Abd: Denies abdominal pain, changes in bowels Extremities: Denies weakness or swelling  Physical Exam: General: Alert, in no acute distress. Pleasant and conversant but anxious to leave. Received several phone calls during exam. HEENT: No icterus, injection or ptosis. No hoarseness or dysarthria. Left external ear with tiny well-healing lesion without surrounding erythema or purulent drainage. Otoscopic exam without abnormality other than presence of moderate cerumen.  Cardiac: RRR, no MGR appreciated Pulmonary: CTA BL with normal WOB on RA. Able to speak in complete sentences Extremities: Warm, perfused. No significant pedal edema.   Vitals:   10/08/17  1042 10/08/17 1146  BP: 104/65 118/70  Pulse: 86 82  Temp: 98 F (36.7 C)   TempSrc: Oral   SpO2: 100%   Weight: 187 lb 8 oz (85 kg)   Height: 6\' 1"  (1.854 m)    Body mass index is 24.74 kg/m.  Assessment & Plan:   See Encounters Tab for problem based charting.  Patient discussed with Dr. Eppie Gibson

## 2017-10-09 DIAGNOSIS — M1990 Unspecified osteoarthritis, unspecified site: Secondary | ICD-10-CM | POA: Diagnosis not present

## 2017-10-10 DIAGNOSIS — M1990 Unspecified osteoarthritis, unspecified site: Secondary | ICD-10-CM | POA: Diagnosis not present

## 2017-10-11 DIAGNOSIS — M1990 Unspecified osteoarthritis, unspecified site: Secondary | ICD-10-CM | POA: Diagnosis not present

## 2017-10-12 DIAGNOSIS — M1990 Unspecified osteoarthritis, unspecified site: Secondary | ICD-10-CM | POA: Diagnosis not present

## 2017-10-12 NOTE — Progress Notes (Signed)
Case discussed with Dr. Molt at the time of the visit.  We reviewed the resident's history and exam and pertinent patient test results.  I agree with the assessment, diagnosis and plan of care documented in the resident's note. 

## 2017-10-13 DIAGNOSIS — M1990 Unspecified osteoarthritis, unspecified site: Secondary | ICD-10-CM | POA: Diagnosis not present

## 2017-10-14 DIAGNOSIS — M1990 Unspecified osteoarthritis, unspecified site: Secondary | ICD-10-CM | POA: Diagnosis not present

## 2017-10-15 ENCOUNTER — Other Ambulatory Visit (INDEPENDENT_AMBULATORY_CARE_PROVIDER_SITE_OTHER): Payer: Medicare HMO

## 2017-10-15 DIAGNOSIS — I10 Essential (primary) hypertension: Secondary | ICD-10-CM

## 2017-10-15 DIAGNOSIS — M1990 Unspecified osteoarthritis, unspecified site: Secondary | ICD-10-CM | POA: Diagnosis not present

## 2017-10-16 ENCOUNTER — Telehealth: Payer: Self-pay | Admitting: Internal Medicine

## 2017-10-16 DIAGNOSIS — M1990 Unspecified osteoarthritis, unspecified site: Secondary | ICD-10-CM | POA: Diagnosis not present

## 2017-10-16 LAB — BASIC METABOLIC PANEL
BUN/Creatinine Ratio: 18 (ref 10–24)
BUN: 23 mg/dL (ref 8–27)
CALCIUM: 9.9 mg/dL (ref 8.6–10.2)
CHLORIDE: 94 mmol/L — AB (ref 96–106)
CO2: 19 mmol/L — AB (ref 20–29)
Creatinine, Ser: 1.27 mg/dL (ref 0.76–1.27)
GFR calc non Af Amer: 58 mL/min/{1.73_m2} — ABNORMAL LOW (ref >59)
GFR, EST AFRICAN AMERICAN: 67 mL/min/{1.73_m2} (ref >59)
Glucose: 549 mg/dL (ref 65–99)
Potassium: 5 mmol/L (ref 3.5–5.2)
SODIUM: 134 mmol/L (ref 134–144)

## 2017-10-16 NOTE — Telephone Encounter (Signed)
Unfortunately this message was not acted upon until around 4 PM.  Dr. Danford Bad contacted the patient and asked him to present to the ED for repeat BMP.  Over the phone he reported to her he felt well.

## 2017-10-16 NOTE — Telephone Encounter (Signed)
Results of yesterdays CMET returned today and was significant for hyperglycemia with glucose of 549, CO2 19 and anion gap of 21. Critical result was called to covering night MD but patient not yet informed. I contacted Mr. Bolle today and informed him of his abnormal labs and my recommendation to present to the Emergency Department for evaluation.   He was surprised to hear his blood sugar was so high yesterday and that it was 333 this morning during AM CBG. He states he feels just fine and specifically denied fatigue, polyuria, polydipsia, nausea, vomiting, abdominal pain, headache or confusion. He is tolerating PO intake without issue.  I asked Mr. Bascomb to present to ED for repeat labs and evaluation for possible DKA however he reminded me he feels "great"& that he is out running errands and wont be able to go for several hours. I encouraged him to go as-soon-as-possible, especially if he were to develop any symptoms. We discussed risk of unmanaged DKA including dehydration, kidney damage and possibly death if severe. I encouraged him to remain well-hydrated, to avoid high-carbohydrate meals and again reinforced my recommendation of ED evaluation and repeat labs. He is agreeable after he is done running errands.   Einar Gip, DO Internal Medicine PGY-2 Pager [phone]

## 2017-10-16 NOTE — Telephone Encounter (Signed)
Received a page overnight from [employer] reporting a critical value from Bmet drawn 10/15/17 showing Glu 549. The rest of the values are as listed (not available in our system yet) Na 134 K 5.0 Cl 94 Bicarb 19 Cr 1.27 BUN 23 Glu 549  No indication for emergent management based on these values, however pt does have an anion gap MA based on these values. Will forward to ordering physician and PCP to address in AM.  Alphonzo Grieve, MD IMTS - PGY2

## 2017-10-17 DIAGNOSIS — M1990 Unspecified osteoarthritis, unspecified site: Secondary | ICD-10-CM | POA: Diagnosis not present

## 2017-10-18 DIAGNOSIS — M1990 Unspecified osteoarthritis, unspecified site: Secondary | ICD-10-CM | POA: Diagnosis not present

## 2017-10-19 ENCOUNTER — Other Ambulatory Visit: Payer: Self-pay | Admitting: Internal Medicine

## 2017-10-19 ENCOUNTER — Telehealth: Payer: Self-pay | Admitting: Internal Medicine

## 2017-10-19 DIAGNOSIS — R739 Hyperglycemia, unspecified: Secondary | ICD-10-CM

## 2017-10-19 DIAGNOSIS — M1990 Unspecified osteoarthritis, unspecified site: Secondary | ICD-10-CM | POA: Diagnosis not present

## 2017-10-19 NOTE — Telephone Encounter (Signed)
Chart review shows patient did not present to ED over the weekend to follow-up hyperglycemia with anion gap metabolic acidosis as requested. He reported feeling "great" on Friday and was frustrated at need to return to hospital, but agreeable to go.    I tried calling the patient this morning ~745am without answer. I've placed a standing order for BMET and have asked front-desk staff to ask patient to RTC today at his earliest convenience for follow-up labs.   Einar Gip, DO Internal Medicine -PGY2 Pager [phone]

## 2017-10-20 DIAGNOSIS — M1990 Unspecified osteoarthritis, unspecified site: Secondary | ICD-10-CM | POA: Diagnosis not present

## 2017-10-21 DIAGNOSIS — M1990 Unspecified osteoarthritis, unspecified site: Secondary | ICD-10-CM | POA: Diagnosis not present

## 2017-10-22 DIAGNOSIS — M1990 Unspecified osteoarthritis, unspecified site: Secondary | ICD-10-CM | POA: Diagnosis not present

## 2017-10-23 DIAGNOSIS — M1990 Unspecified osteoarthritis, unspecified site: Secondary | ICD-10-CM | POA: Diagnosis not present

## 2017-10-24 DIAGNOSIS — M1990 Unspecified osteoarthritis, unspecified site: Secondary | ICD-10-CM | POA: Diagnosis not present

## 2017-10-25 DIAGNOSIS — M1990 Unspecified osteoarthritis, unspecified site: Secondary | ICD-10-CM | POA: Diagnosis not present

## 2017-10-26 DIAGNOSIS — M1990 Unspecified osteoarthritis, unspecified site: Secondary | ICD-10-CM | POA: Diagnosis not present

## 2017-10-27 ENCOUNTER — Other Ambulatory Visit (INDEPENDENT_AMBULATORY_CARE_PROVIDER_SITE_OTHER): Payer: Medicare HMO

## 2017-10-27 DIAGNOSIS — R739 Hyperglycemia, unspecified: Secondary | ICD-10-CM

## 2017-10-27 DIAGNOSIS — M1990 Unspecified osteoarthritis, unspecified site: Secondary | ICD-10-CM | POA: Diagnosis not present

## 2017-10-27 LAB — BASIC METABOLIC PANEL
ANION GAP: 6 (ref 5–15)
BUN: 11 mg/dL (ref 6–20)
CHLORIDE: 107 mmol/L (ref 101–111)
CO2: 25 mmol/L (ref 22–32)
Calcium: 9.2 mg/dL (ref 8.9–10.3)
Creatinine, Ser: 1.02 mg/dL (ref 0.61–1.24)
GFR calc Af Amer: >60 mL/min (ref >60)
GLUCOSE: 326 mg/dL — AB (ref 65–99)
Potassium: 4.4 mmol/L (ref 3.5–5.1)
Sodium: 138 mmol/L (ref 135–145)

## 2017-10-28 DIAGNOSIS — M1990 Unspecified osteoarthritis, unspecified site: Secondary | ICD-10-CM | POA: Diagnosis not present

## 2017-10-29 DIAGNOSIS — M1990 Unspecified osteoarthritis, unspecified site: Secondary | ICD-10-CM | POA: Diagnosis not present

## 2017-10-30 ENCOUNTER — Other Ambulatory Visit: Payer: Self-pay | Admitting: *Deleted

## 2017-10-30 DIAGNOSIS — M170 Bilateral primary osteoarthritis of knee: Secondary | ICD-10-CM

## 2017-10-30 DIAGNOSIS — M1990 Unspecified osteoarthritis, unspecified site: Secondary | ICD-10-CM | POA: Diagnosis not present

## 2017-10-30 MED ORDER — HYDROCODONE-ACETAMINOPHEN 7.5-325 MG PO TABS: 1.0000 | ORAL_TABLET | Freq: Four times a day (QID) | ORAL | 0 refills | Status: DC | PRN

## 2017-10-31 DIAGNOSIS — M1990 Unspecified osteoarthritis, unspecified site: Secondary | ICD-10-CM | POA: Diagnosis not present

## 2017-11-01 DIAGNOSIS — M1990 Unspecified osteoarthritis, unspecified site: Secondary | ICD-10-CM | POA: Diagnosis not present

## 2017-11-02 DIAGNOSIS — M1990 Unspecified osteoarthritis, unspecified site: Secondary | ICD-10-CM | POA: Diagnosis not present

## 2017-11-03 DIAGNOSIS — M1990 Unspecified osteoarthritis, unspecified site: Secondary | ICD-10-CM | POA: Diagnosis not present

## 2017-11-04 DIAGNOSIS — M1990 Unspecified osteoarthritis, unspecified site: Secondary | ICD-10-CM | POA: Diagnosis not present

## 2017-11-05 DIAGNOSIS — M1990 Unspecified osteoarthritis, unspecified site: Secondary | ICD-10-CM | POA: Diagnosis not present

## 2017-11-06 DIAGNOSIS — M1990 Unspecified osteoarthritis, unspecified site: Secondary | ICD-10-CM | POA: Diagnosis not present

## 2017-11-07 DIAGNOSIS — M1990 Unspecified osteoarthritis, unspecified site: Secondary | ICD-10-CM | POA: Diagnosis not present

## 2017-11-08 DIAGNOSIS — M1990 Unspecified osteoarthritis, unspecified site: Secondary | ICD-10-CM | POA: Diagnosis not present

## 2017-11-09 DIAGNOSIS — M1990 Unspecified osteoarthritis, unspecified site: Secondary | ICD-10-CM | POA: Diagnosis not present

## 2017-11-10 ENCOUNTER — Other Ambulatory Visit: Payer: Self-pay | Admitting: Internal Medicine

## 2017-11-10 DIAGNOSIS — M1990 Unspecified osteoarthritis, unspecified site: Secondary | ICD-10-CM | POA: Diagnosis not present

## 2017-11-10 DIAGNOSIS — J449 Chronic obstructive pulmonary disease, unspecified: Secondary | ICD-10-CM

## 2017-11-10 NOTE — Telephone Encounter (Signed)
Pls sch PCP appt (not [facility]) Dm, HTN, pain F/U

## 2017-11-11 DIAGNOSIS — M1990 Unspecified osteoarthritis, unspecified site: Secondary | ICD-10-CM | POA: Diagnosis not present

## 2017-11-12 DIAGNOSIS — M1990 Unspecified osteoarthritis, unspecified site: Secondary | ICD-10-CM | POA: Diagnosis not present

## 2017-11-13 DIAGNOSIS — M1990 Unspecified osteoarthritis, unspecified site: Secondary | ICD-10-CM | POA: Diagnosis not present

## 2017-11-13 NOTE — Telephone Encounter (Signed)
Called patient to schedule with PCP. Unfortunately, no opening in next 3 months. Will discuss with Practice Administrator and call patient back. Hubbard Hartshorn, RN, BSN

## 2017-11-14 DIAGNOSIS — M1990 Unspecified osteoarthritis, unspecified site: Secondary | ICD-10-CM | POA: Diagnosis not present

## 2017-11-15 DIAGNOSIS — M1990 Unspecified osteoarthritis, unspecified site: Secondary | ICD-10-CM | POA: Diagnosis not present

## 2017-11-16 DIAGNOSIS — M1990 Unspecified osteoarthritis, unspecified site: Secondary | ICD-10-CM | POA: Diagnosis not present

## 2017-11-17 DIAGNOSIS — M1990 Unspecified osteoarthritis, unspecified site: Secondary | ICD-10-CM | POA: Diagnosis not present

## 2017-11-18 DIAGNOSIS — M1990 Unspecified osteoarthritis, unspecified site: Secondary | ICD-10-CM | POA: Diagnosis not present

## 2017-11-19 DIAGNOSIS — M1990 Unspecified osteoarthritis, unspecified site: Secondary | ICD-10-CM | POA: Diagnosis not present

## 2017-11-20 DIAGNOSIS — M1990 Unspecified osteoarthritis, unspecified site: Secondary | ICD-10-CM | POA: Diagnosis not present

## 2017-11-21 DIAGNOSIS — M1990 Unspecified osteoarthritis, unspecified site: Secondary | ICD-10-CM | POA: Diagnosis not present

## 2017-11-22 DIAGNOSIS — M1990 Unspecified osteoarthritis, unspecified site: Secondary | ICD-10-CM | POA: Diagnosis not present

## 2017-11-23 DIAGNOSIS — M1990 Unspecified osteoarthritis, unspecified site: Secondary | ICD-10-CM | POA: Diagnosis not present

## 2017-11-24 DIAGNOSIS — M1990 Unspecified osteoarthritis, unspecified site: Secondary | ICD-10-CM | POA: Diagnosis not present

## 2017-11-25 DIAGNOSIS — M1990 Unspecified osteoarthritis, unspecified site: Secondary | ICD-10-CM | POA: Diagnosis not present

## 2017-11-26 DIAGNOSIS — M1990 Unspecified osteoarthritis, unspecified site: Secondary | ICD-10-CM | POA: Diagnosis not present

## 2017-11-27 DIAGNOSIS — M1990 Unspecified osteoarthritis, unspecified site: Secondary | ICD-10-CM | POA: Diagnosis not present

## 2017-11-28 DIAGNOSIS — M1990 Unspecified osteoarthritis, unspecified site: Secondary | ICD-10-CM | POA: Diagnosis not present

## 2017-11-29 DIAGNOSIS — M1990 Unspecified osteoarthritis, unspecified site: Secondary | ICD-10-CM | POA: Diagnosis not present

## 2017-11-30 ENCOUNTER — Other Ambulatory Visit: Payer: Self-pay | Admitting: Internal Medicine

## 2017-11-30 DIAGNOSIS — M1990 Unspecified osteoarthritis, unspecified site: Secondary | ICD-10-CM | POA: Diagnosis not present

## 2017-11-30 DIAGNOSIS — M170 Bilateral primary osteoarthritis of knee: Secondary | ICD-10-CM

## 2017-11-30 MED ORDER — HYDROCODONE-ACETAMINOPHEN 7.5-325 MG PO TABS: 1.0000 | ORAL_TABLET | Freq: Four times a day (QID) | ORAL | 0 refills | Status: DC | PRN

## 2017-11-30 NOTE — Telephone Encounter (Signed)
Refill X 3 sent electronically.  [place] narcotic database reviewed and appropriate.

## 2017-11-30 NOTE — Telephone Encounter (Signed)
Patient is requesting refills on pain medicine  °

## 2017-12-01 DIAGNOSIS — M1990 Unspecified osteoarthritis, unspecified site: Secondary | ICD-10-CM | POA: Diagnosis not present

## 2017-12-02 ENCOUNTER — Encounter: Payer: Self-pay | Admitting: Neurology

## 2017-12-02 ENCOUNTER — Ambulatory Visit (INDEPENDENT_AMBULATORY_CARE_PROVIDER_SITE_OTHER): Payer: Medicare HMO | Admitting: Neurology

## 2017-12-02 VITALS — BP 116/80 | HR 86 | Ht 73.0 in | Wt 183.0 lb

## 2017-12-02 DIAGNOSIS — G250 Essential tremor: Secondary | ICD-10-CM | POA: Diagnosis not present

## 2017-12-02 DIAGNOSIS — M1990 Unspecified osteoarthritis, unspecified site: Secondary | ICD-10-CM | POA: Diagnosis not present

## 2017-12-02 NOTE — Progress Notes (Signed)
Subjective:    [name] ID: Adrian [name] is a 67 y.o. male.  HPI     Interim history:   Adrian [name] is a 67 year old right-handed gentleman with an underlying medical history of hypertension, poorly controlled type 2 diabetes, osteoarthritis, hypertension, hyperlipidemia, smoking, COPD, PSVT, and mildly overweight state, who presents for follow-up consultation of his tremors. Adrian [name] is unaccompanied today. I first met him on 06/03/2017 at Adrian request of his primary care physician, at which time he reported a long-standing history of bilateral upper extremity tremors. He was on primidone and propranolol at Adrian time, primidone was low dose but propranolol was 80 mg twice a day. I suggested we proceed with a brain MRI. He did not have this done.  Today, 12/02/2017: He reports he was not contacted about doing an MRI. He would be willing to pursue it. His headaches are actually better as he reports that his stress is less. He tries to hydrate well with water. Tremor is stable. He had a recent fall going up Adrian stairs. It has been raining outside and his shoes were wet, he slipped, he did not injure himself seriously, but scraped his elbow.   Adrian [name]'s allergies, current medications, family history, past medical history, past social history, past surgical history and problem list were reviewed and updated as appropriate.   Previously (copied from previous notes for reference):   06/03/2017: (He) reports a long-standing history of bilateral upper extremity tremors. He has had tremors for decades. He was recently started on propranolol and primidone as I understand. He may have been on these medicines for about 2 months. I reviewed your office note from 04/08/2017. He has been on propranolol 80 mg twice a day and primidone, currently 50 mg at bedtime. No tell-tale FHx of tremors, mom drank alcohol when she was pregnant with him, per [name]. He had a problem with alcohol in Adrian past (per  his own report) and quit in 2002. He quit smoking in 2008. He does report that nervousness or stress makes his tremor worse. He does not drink caffeine and all his drinks or decaf. He is divorced, he lives alone. He does not have any biological children. He is not sure if Adrian medication has been helpful. He has not had a brain scan. He has not had any recent falls. He has occasional dull headaches, not severe, no history of migraines.  His Past Medical History Is Significant For: Past Medical History:  Diagnosis Date  . Baker's cyst, ruptured   . COPD (chronic obstructive pulmonary disease) ([place])   . Diabetes mellitus, type 2 ([place])   . Erectile dysfunction   . History of tobacco abuse    Year started: 26   Year quit: 01/2007  . Hyperlipidemia   . Hypertension   . Knee osteoarthritis    Bilateral,   [name] has been evaluated by Ortho for knee replacement in Adrian past  . Lipoma of skin   . Osteoarthritis   . PSVT (paroxysmal supraventricular tachycardia) ([place])    Required adenosine 2006  . Trochanteric bursitis of left hip     His Past Surgical History Is Significant For: No past surgical history on file.  His Family History Is Significant For: Family History  Problem Relation Age of Onset  . Cancer Mother        Throat cancer  . Cirrhosis Father   . Alcohol abuse Father     His Social History Is Significant For: Social History  Socioeconomic History  . Marital status: Divorced    Spouse name: Not on file  . Number of children: Not on file  . Years of education: Not on file  . Highest education level: Not on file  Occupational History  . Not on file  Social Needs  . Financial resource strain: Not on file  . Food insecurity:    Worry: Not on file    Inability: Not on file  . Transportation needs:    Medical: Not on file    Non-medical: Not on file  Tobacco Use  . Smoking status: Former Smoker    Packs/day: 1.00    Years: 47.00    Pack years: 47.00    Types:  Cigarettes    Last attempt to quit: 01/23/2007    Years since quitting: 10.8  . Smokeless tobacco: Never Used  . Tobacco comment: Smoked 1 ppd from 1961-2008, quit 01/23/2007  Substance and Sexual Activity  . Alcohol use: No    Alcohol/week: 0.0 oz  . Drug use: No  . Sexual activity: Not on file  Lifestyle  . Physical activity:    Days per week: Not on file    Minutes per session: Not on file  . Stress: Not on file  Relationships  . Social connections:    Talks on phone: Not on file    Gets together: Not on file    Attends religious service: Not on file    Active member of club or organization: Not on file    Attends meetings of clubs or organizations: Not on file    Relationship status: Not on file  Other Topics Concern  . Not on file  Social History Narrative   [name] wants to use [employer] for diabetes testing supplies. These forms should be put directly into doctor's box.   Gevena Cotton RN    His Allergies Are:  Allergies  Allergen Reactions  . Latex Rash  :   His Current Medications Are:  Outpatient Encounter Medications as of 12/02/2017  Medication Sig  . ACCU-CHEK FASTCLIX LANCETS MISC Check blood sugar 3 times daily before meal or bedtime  . albuterol (VENTOLIN HFA) 108 (90 Base) MCG/ACT inhaler INHALE 2 PUFFS INTO Adrian LUNGS EVERY 6 HOURS AS NEEDED FOR SHORTNESS OF BREATH OR WHEEZING  . aspirin (ANACIN) 81 MG EC tablet Take 1 tablet (81 mg total) by mouth daily.  . Blood Glucose Calibration (ACCU-CHEK AVIVA) SOLN   . Blood Glucose Monitoring Suppl (ACCU-CHEK GUIDE) w/Device KIT 1 each by Does not apply route 3 (three) times daily.  Marland Kitchen diltiazem (CARDIZEM CD) 240 MG 24 hr capsule TAKE ONE CAPSULE BY MOUTH EVERY DAY  . glipiZIDE (GLUCOTROL) 10 MG tablet TAKE 2 TABLETS BY MOUTH EVERY MORNING TAKE 1 TABLET BY MOUTH EVERY EVENING  . glucose blood (ACCU-CHEK GUIDE) test strip Check blood sugar 3 times daily before meal or bedtime  .  HYDROcodone-acetaminophen (NORCO) 7.5-325 MG tablet Take 1 tablet by mouth every 6 (six) hours as needed (for pain).  Marland Kitchen HYDROcodone-acetaminophen (NORCO) 7.5-325 MG tablet Take 1 tablet by mouth every 6 (six) hours as needed for moderate pain.  Marland Kitchen HYDROcodone-acetaminophen (NORCO) 7.5-325 MG tablet Take 1 tablet by mouth every 6 (six) hours as needed for moderate pain.  Marland Kitchen JANUMET 50-1000 MG tablet TAKE 1 TABLET BY MOUTH 2 (TWO) TIMES DAILY WITH A MEAL.  Marland Kitchen Lancet Devices (ADJUSTABLE LANCING DEVICE) MISC   . pravastatin (PRAVACHOL) 40 MG tablet TAKE 1 TABLET BY  MOUTH IN Adrian EVENING EVERY DAY  . primidone (MYSOLINE) 50 MG tablet Take 1 tablet (50 mg total) by mouth at bedtime.  . propranolol (INDERAL) 80 MG tablet Take 1 tablet (80 mg total) by mouth 2 (two) times daily.  Marland Kitchen SPIRIVA HANDIHALER 18 MCG inhalation capsule PLACE 1 CAPSULE INTO INHALER AND INHALE DAILY.  . [DISCONTINUED] canagliflozin (INVOKANA) 100 MG TABS tablet Take 1 tablet (100 mg total) by mouth daily before breakfast.  . [DISCONTINUED] hydrochlorothiazide (MICROZIDE) 12.5 MG capsule Take 1 capsule (12.5 mg total) by mouth daily.   No facility-administered encounter medications on file as of 12/02/2017.   :  Review of Systems:  Out of a complete 14 point review of systems, all are reviewed and negative with Adrian exception of these symptoms as listed below: Review of Systems  Neurological:       Pt presents today to discuss his tremor. Pt did not complete his MRI because he was not contacted. Pt reports a couple falls recently.    Objective:  Neurological Exam  Physical Exam Physical Examination:   Vitals:   12/02/17 1314  BP: 116/80  Pulse: 86   General Examination: Adrian [name] is a very pleasant 67 y.o. male in no acute distress. He appears well-developed and well-nourished and adequately groomed.   HEENT: Normocephalic, atraumatic, pupils are equal, round and reactive to light and accommodation. Extraocular tracking is  good without limitation to gaze excursion or nystagmus noted. Normal smooth pursuit is noted. Hearing is grossly intact. Face is symmetric with normal facial animation and normal facial sensation. Speech is clear with no dysarthria noted, edentulous. There is no hypophonia. There is no lip, neck/head, jaw or voice tremor. Neck is supple with full range of passive and active motion. There are no carotid bruits on auscultation. Oropharynx exam reveals: mild mouth dryness, edentulous, no other issues. Tongue protrudes centrally and palate elevates symmetrically.   Chest: Clear to auscultation without wheezing, rhonchi or crackles noted.  Heart: S1+S2+0, regular and normal without murmurs, rubs or gallops noted.   Abdomen: Soft, non-tender and non-distended with normal bowel sounds appreciated on auscultation.  Extremities: There is no pitting edema in Adrian distal lower extremities bilaterally.  Skin: Warm and dry without trophic changes noted.  Musculoskeletal: exam reveals no obvious joint deformities, tenderness or joint swelling or erythema, with Adrian exception of bilateral knee pain.   Neurologically:  Mental status: Adrian [name] is awake, alert and oriented in all 4 spheres. His immediate and remote memory, attention, language skills and fund of knowledge are appropriate. There is no evidence of aphasia, agnosia, apraxia or anomia. Speech is clear with normal prosody and enunciation (edentulous). Thought process is linear. Mood is normal and affect is normal.  Cranial nerves II - XII are as described above under HEENT exam. Motor exam: Normal bulk, strength and tone is noted. There is no drift, or rebound. Romberg is negative.   (On 06/03/2017: On Archimedes spiral drawing he has mild to moderate trembling on Adrian right and moderate trembling on Adrian left, handwriting is tremulous, legible, not micrographic).  Reflexes are 1+ in Adrian upper extremities, trace in both knees and absent in both  ankles. He has no resting tremor. He has a slight bilateral upper extremity postural and action tremor, better than last time, no intention tremor.  Fine motor skills and coordination: intact grossly.  Cerebellar testing: No dysmetria or intention tremor. There is no truncal or gait ataxia.  Sensory exam: intact to light touch in  Adrian upper and lower extremities.  Gait, station and balance: He stands easily. No veering to one side is noted. No leaning to one side is noted. Posture is age-appropriate and stance is narrow based. Gait shows normal stride length and normal pace, preserved arm swing.   Assessment and Plan:   In summary, Krish Bailly Rowlette is a very pleasant 67 year old male with an underlying medical history of hypertension, poorly controlled type 2 diabetes, osteoarthritis, hypertension, hyperlipidemia, smoking, COPD, PSVT, and mildly overweight state, who presents for follow-up consultation of his tremors. His exam is better than last time. His headaches have improved. He has not had his brain MRI and would be willing to pursue this. He has been on primidone and propranolol and can continue these medications per primary care physician. So long as his brain MRI is age-appropriate, I can see him back on an as needed basis. I answered all his questions today and he was in agreement. I spent 15 minutes in total face-to-face time with Adrian [name], more than 50% of which was spent in counseling and coordination of care, reviewing test results, reviewing medication and discussing or reviewing Adrian diagnosis of tremor, its prognosis and treatment options. Pertinent laboratory and imaging test results that were available during this visit with Adrian [name] were reviewed by me and considered in my medical decision making (see chart for details).

## 2017-12-02 NOTE — Patient Instructions (Addendum)
I am glad your headaches are better and stress is less.  Your tremor is stable.  Please call [facility] to see about scheduling your brain MRI: [phone].  So long as your MRI of the brain shows nothing acute and age appropriate findings, I can see you back as needed.

## 2017-12-03 DIAGNOSIS — M1990 Unspecified osteoarthritis, unspecified site: Secondary | ICD-10-CM | POA: Diagnosis not present

## 2017-12-04 DIAGNOSIS — M1990 Unspecified osteoarthritis, unspecified site: Secondary | ICD-10-CM | POA: Diagnosis not present

## 2017-12-05 DIAGNOSIS — M1990 Unspecified osteoarthritis, unspecified site: Secondary | ICD-10-CM | POA: Diagnosis not present

## 2017-12-06 DIAGNOSIS — M1990 Unspecified osteoarthritis, unspecified site: Secondary | ICD-10-CM | POA: Diagnosis not present

## 2017-12-07 ENCOUNTER — Telehealth: Payer: Self-pay | Admitting: Neurology

## 2017-12-07 DIAGNOSIS — M1990 Unspecified osteoarthritis, unspecified site: Secondary | ICD-10-CM | POA: Diagnosis not present

## 2017-12-07 NOTE — Telephone Encounter (Signed)
Patient is schedule to have his MRI done on 12/11/17 at [facility] Josem Kaufmann: [phone] (exp. 12/07/17 to 01/06/18).

## 2017-12-08 DIAGNOSIS — M1990 Unspecified osteoarthritis, unspecified site: Secondary | ICD-10-CM | POA: Diagnosis not present

## 2017-12-09 DIAGNOSIS — M1990 Unspecified osteoarthritis, unspecified site: Secondary | ICD-10-CM | POA: Diagnosis not present

## 2017-12-10 DIAGNOSIS — M1990 Unspecified osteoarthritis, unspecified site: Secondary | ICD-10-CM | POA: Diagnosis not present

## 2017-12-11 ENCOUNTER — Ambulatory Visit
Admission: RE | Admit: 2017-12-11 | Discharge: 2017-12-11 | Disposition: A | Discharge status: Home / Self Care | Payer: Medicare HMO | Source: Ambulatory Visit | Attending: Neurology | Admitting: Neurology

## 2017-12-11 DIAGNOSIS — G250 Essential tremor: Secondary | ICD-10-CM

## 2017-12-11 DIAGNOSIS — M1990 Unspecified osteoarthritis, unspecified site: Secondary | ICD-10-CM | POA: Diagnosis not present

## 2017-12-11 DIAGNOSIS — G4489 Other headache syndrome: Secondary | ICD-10-CM | POA: Diagnosis not present

## 2017-12-12 DIAGNOSIS — M1990 Unspecified osteoarthritis, unspecified site: Secondary | ICD-10-CM | POA: Diagnosis not present

## 2017-12-13 DIAGNOSIS — M1990 Unspecified osteoarthritis, unspecified site: Secondary | ICD-10-CM | POA: Diagnosis not present

## 2017-12-14 DIAGNOSIS — M1990 Unspecified osteoarthritis, unspecified site: Secondary | ICD-10-CM | POA: Diagnosis not present

## 2017-12-15 ENCOUNTER — Telehealth: Payer: Self-pay | Admitting: Neurology

## 2017-12-15 DIAGNOSIS — M1990 Unspecified osteoarthritis, unspecified site: Secondary | ICD-10-CM | POA: Diagnosis not present

## 2017-12-15 NOTE — Telephone Encounter (Signed)
Pt has returned the call to Dr Jannifer Franklin, he is asking for a call back please

## 2017-12-15 NOTE — Telephone Encounter (Signed)
  I called the patient.  MRI of the brain shows mild small vessel disease, the patient does have diabetes and hypertension.  Nothing that should be causing tremors.   MRI brain 12/11/17:  IMPRESSION: This MRI of the brain without contrast shows the following: 1.    T2/flair hyperintense foci in the hemispheres consistent with mild chronic microvascular ischemic change. 2.    There are no acute findings.

## 2017-12-15 NOTE — Telephone Encounter (Signed)
-----   Message from Lester Weir, RN sent at 12/15/2017  1:35 PM EDT -----  Durene Cal to [facility], on behalf of Dr. Rexene Alberts, who is out of the office. ----- Message ----- From: Britt Bottom, MD Sent: 12/11/2017   1:58 PM To: Star Age, MD

## 2017-12-15 NOTE — Telephone Encounter (Signed)
I called the patient again, went over the results of the MRI.  Shows mild small vessel disease, nothing out of the ordinary from what you expect with someone with diabetes and hypertension.  Nothing that would cause tremors.

## 2017-12-16 DIAGNOSIS — M1990 Unspecified osteoarthritis, unspecified site: Secondary | ICD-10-CM | POA: Diagnosis not present

## 2017-12-17 DIAGNOSIS — M1990 Unspecified osteoarthritis, unspecified site: Secondary | ICD-10-CM | POA: Diagnosis not present

## 2017-12-18 DIAGNOSIS — M1990 Unspecified osteoarthritis, unspecified site: Secondary | ICD-10-CM | POA: Diagnosis not present

## 2017-12-19 DIAGNOSIS — M1990 Unspecified osteoarthritis, unspecified site: Secondary | ICD-10-CM | POA: Diagnosis not present

## 2017-12-20 DIAGNOSIS — M1990 Unspecified osteoarthritis, unspecified site: Secondary | ICD-10-CM | POA: Diagnosis not present

## 2017-12-21 DIAGNOSIS — M1990 Unspecified osteoarthritis, unspecified site: Secondary | ICD-10-CM | POA: Diagnosis not present

## 2017-12-22 DIAGNOSIS — M1990 Unspecified osteoarthritis, unspecified site: Secondary | ICD-10-CM | POA: Diagnosis not present

## 2017-12-23 ENCOUNTER — Ambulatory Visit: Payer: Medicare HMO | Admitting: Internal Medicine

## 2017-12-23 DIAGNOSIS — M1990 Unspecified osteoarthritis, unspecified site: Secondary | ICD-10-CM | POA: Diagnosis not present

## 2017-12-24 DIAGNOSIS — M1990 Unspecified osteoarthritis, unspecified site: Secondary | ICD-10-CM | POA: Diagnosis not present

## 2017-12-25 DIAGNOSIS — M1990 Unspecified osteoarthritis, unspecified site: Secondary | ICD-10-CM | POA: Diagnosis not present

## 2017-12-26 ENCOUNTER — Other Ambulatory Visit: Payer: Self-pay | Admitting: Internal Medicine

## 2017-12-26 DIAGNOSIS — I10 Essential (primary) hypertension: Secondary | ICD-10-CM

## 2017-12-26 DIAGNOSIS — M1990 Unspecified osteoarthritis, unspecified site: Secondary | ICD-10-CM | POA: Diagnosis not present

## 2017-12-27 DIAGNOSIS — M1990 Unspecified osteoarthritis, unspecified site: Secondary | ICD-10-CM | POA: Diagnosis not present

## 2017-12-28 DIAGNOSIS — M1990 Unspecified osteoarthritis, unspecified site: Secondary | ICD-10-CM | POA: Diagnosis not present

## 2017-12-29 ENCOUNTER — Other Ambulatory Visit: Payer: Self-pay | Admitting: Internal Medicine

## 2017-12-29 DIAGNOSIS — M1990 Unspecified osteoarthritis, unspecified site: Secondary | ICD-10-CM | POA: Diagnosis not present

## 2017-12-29 NOTE — Telephone Encounter (Addendum)
3 scripts [facility]. Confirmed with pharmacy that this is already filled and ready for pick up. Hubbard Hartshorn, RN, BSN

## 2017-12-29 NOTE — Telephone Encounter (Signed)
Needs refill on Hydrocodone @ [facility]; pt contact# [phone]

## 2017-12-30 ENCOUNTER — Telehealth: Payer: Self-pay | Admitting: Internal Medicine

## 2017-12-30 DIAGNOSIS — M1990 Unspecified osteoarthritis, unspecified site: Secondary | ICD-10-CM | POA: Diagnosis not present

## 2017-12-30 NOTE — Telephone Encounter (Signed)
Spoke with patient. Explained his pharmacy sent refill request for HCTZ yesterday and PCP approved it. Patient has appt with PCP on 01/20/2018 and will discuss need for this med at that time. Hubbard Hartshorn, RN, BSN

## 2017-12-30 NOTE — Telephone Encounter (Signed)
Pharmacy is trying to charge patient for hydrochlorothiazide according to the patient the physician took him off medicine 2yrs ago, pls contact pt # [phone]

## 2017-12-31 DIAGNOSIS — M1990 Unspecified osteoarthritis, unspecified site: Secondary | ICD-10-CM | POA: Diagnosis not present

## 2018-01-01 DIAGNOSIS — M1990 Unspecified osteoarthritis, unspecified site: Secondary | ICD-10-CM | POA: Diagnosis not present

## 2018-01-02 DIAGNOSIS — M1990 Unspecified osteoarthritis, unspecified site: Secondary | ICD-10-CM | POA: Diagnosis not present

## 2018-01-03 DIAGNOSIS — M1990 Unspecified osteoarthritis, unspecified site: Secondary | ICD-10-CM | POA: Diagnosis not present

## 2018-01-04 DIAGNOSIS — M1990 Unspecified osteoarthritis, unspecified site: Secondary | ICD-10-CM | POA: Diagnosis not present

## 2018-01-05 DIAGNOSIS — M1990 Unspecified osteoarthritis, unspecified site: Secondary | ICD-10-CM | POA: Diagnosis not present

## 2018-01-06 DIAGNOSIS — M1990 Unspecified osteoarthritis, unspecified site: Secondary | ICD-10-CM | POA: Diagnosis not present

## 2018-01-07 DIAGNOSIS — M1990 Unspecified osteoarthritis, unspecified site: Secondary | ICD-10-CM | POA: Diagnosis not present

## 2018-01-08 DIAGNOSIS — M1990 Unspecified osteoarthritis, unspecified site: Secondary | ICD-10-CM | POA: Diagnosis not present

## 2018-01-09 DIAGNOSIS — M1990 Unspecified osteoarthritis, unspecified site: Secondary | ICD-10-CM | POA: Diagnosis not present

## 2018-01-10 DIAGNOSIS — M1990 Unspecified osteoarthritis, unspecified site: Secondary | ICD-10-CM | POA: Diagnosis not present

## 2018-01-11 DIAGNOSIS — M1990 Unspecified osteoarthritis, unspecified site: Secondary | ICD-10-CM | POA: Diagnosis not present

## 2018-01-12 DIAGNOSIS — M1990 Unspecified osteoarthritis, unspecified site: Secondary | ICD-10-CM | POA: Diagnosis not present

## 2018-01-13 DIAGNOSIS — M1990 Unspecified osteoarthritis, unspecified site: Secondary | ICD-10-CM | POA: Diagnosis not present

## 2018-01-14 DIAGNOSIS — M1990 Unspecified osteoarthritis, unspecified site: Secondary | ICD-10-CM | POA: Diagnosis not present

## 2018-01-15 DIAGNOSIS — M1990 Unspecified osteoarthritis, unspecified site: Secondary | ICD-10-CM | POA: Diagnosis not present

## 2018-01-16 DIAGNOSIS — M1990 Unspecified osteoarthritis, unspecified site: Secondary | ICD-10-CM | POA: Diagnosis not present

## 2018-01-17 DIAGNOSIS — M1990 Unspecified osteoarthritis, unspecified site: Secondary | ICD-10-CM | POA: Diagnosis not present

## 2018-01-18 ENCOUNTER — Other Ambulatory Visit: Payer: Self-pay | Admitting: Internal Medicine

## 2018-01-18 DIAGNOSIS — I10 Essential (primary) hypertension: Secondary | ICD-10-CM

## 2018-01-18 DIAGNOSIS — M1990 Unspecified osteoarthritis, unspecified site: Secondary | ICD-10-CM | POA: Diagnosis not present

## 2018-01-18 MED ORDER — HYDROCHLOROTHIAZIDE 12.5 MG PO CAPS: 12.5000 mg | ORAL_CAPSULE | Freq: Every day | ORAL | 1 refills | Status: DC

## 2018-01-18 NOTE — Telephone Encounter (Signed)
Pt misplaced fluid pills, he really need some more @ [facility]. Pt contact # [phone]

## 2018-01-19 DIAGNOSIS — M1990 Unspecified osteoarthritis, unspecified site: Secondary | ICD-10-CM | POA: Diagnosis not present

## 2018-01-20 ENCOUNTER — Ambulatory Visit: Payer: Medicare HMO | Admitting: Internal Medicine

## 2018-01-20 DIAGNOSIS — M1990 Unspecified osteoarthritis, unspecified site: Secondary | ICD-10-CM | POA: Diagnosis not present

## 2018-01-21 DIAGNOSIS — M1990 Unspecified osteoarthritis, unspecified site: Secondary | ICD-10-CM | POA: Diagnosis not present

## 2018-01-22 DIAGNOSIS — M1990 Unspecified osteoarthritis, unspecified site: Secondary | ICD-10-CM | POA: Diagnosis not present

## 2018-01-23 DIAGNOSIS — M1990 Unspecified osteoarthritis, unspecified site: Secondary | ICD-10-CM | POA: Diagnosis not present

## 2018-01-24 DIAGNOSIS — M1990 Unspecified osteoarthritis, unspecified site: Secondary | ICD-10-CM | POA: Diagnosis not present

## 2018-01-25 DIAGNOSIS — M1990 Unspecified osteoarthritis, unspecified site: Secondary | ICD-10-CM | POA: Diagnosis not present

## 2018-01-26 DIAGNOSIS — M1990 Unspecified osteoarthritis, unspecified site: Secondary | ICD-10-CM | POA: Diagnosis not present

## 2018-01-27 DIAGNOSIS — M1990 Unspecified osteoarthritis, unspecified site: Secondary | ICD-10-CM | POA: Diagnosis not present

## 2018-01-28 DIAGNOSIS — M1990 Unspecified osteoarthritis, unspecified site: Secondary | ICD-10-CM | POA: Diagnosis not present

## 2018-01-29 DIAGNOSIS — M1990 Unspecified osteoarthritis, unspecified site: Secondary | ICD-10-CM | POA: Diagnosis not present

## 2018-01-30 DIAGNOSIS — M1990 Unspecified osteoarthritis, unspecified site: Secondary | ICD-10-CM | POA: Diagnosis not present

## 2018-01-31 DIAGNOSIS — M1990 Unspecified osteoarthritis, unspecified site: Secondary | ICD-10-CM | POA: Diagnosis not present

## 2018-02-01 DIAGNOSIS — M1990 Unspecified osteoarthritis, unspecified site: Secondary | ICD-10-CM | POA: Diagnosis not present

## 2018-02-02 DIAGNOSIS — M1990 Unspecified osteoarthritis, unspecified site: Secondary | ICD-10-CM | POA: Diagnosis not present

## 2018-02-03 DIAGNOSIS — M1990 Unspecified osteoarthritis, unspecified site: Secondary | ICD-10-CM | POA: Diagnosis not present

## 2018-02-04 DIAGNOSIS — M1990 Unspecified osteoarthritis, unspecified site: Secondary | ICD-10-CM | POA: Diagnosis not present

## 2018-02-05 DIAGNOSIS — M1990 Unspecified osteoarthritis, unspecified site: Secondary | ICD-10-CM | POA: Diagnosis not present

## 2018-02-06 DIAGNOSIS — M1990 Unspecified osteoarthritis, unspecified site: Secondary | ICD-10-CM | POA: Diagnosis not present

## 2018-02-07 ENCOUNTER — Other Ambulatory Visit: Payer: Self-pay | Admitting: Internal Medicine

## 2018-02-07 DIAGNOSIS — M1990 Unspecified osteoarthritis, unspecified site: Secondary | ICD-10-CM | POA: Diagnosis not present

## 2018-02-07 DIAGNOSIS — J449 Chronic obstructive pulmonary disease, unspecified: Secondary | ICD-10-CM

## 2018-02-08 DIAGNOSIS — M1990 Unspecified osteoarthritis, unspecified site: Secondary | ICD-10-CM | POA: Diagnosis not present

## 2018-02-08 NOTE — Telephone Encounter (Signed)
He got 18 inhalers with 2 refills in May. Surely he did not use 54 inhalers in 3 months.

## 2018-02-09 DIAGNOSIS — M1990 Unspecified osteoarthritis, unspecified site: Secondary | ICD-10-CM | POA: Diagnosis not present

## 2018-02-09 NOTE — Telephone Encounter (Signed)
Spoke with [facility] at [facility]. States in their system ventolin was entered as 18 g. He will correct to 18 inhalers. No need to send refill. Hubbard Hartshorn, RN, BSN

## 2018-02-09 NOTE — Telephone Encounter (Signed)
Thank you. It prob should have been grams and not inhalers.

## 2018-02-10 ENCOUNTER — Other Ambulatory Visit: Payer: Self-pay | Admitting: Internal Medicine

## 2018-02-10 DIAGNOSIS — M1990 Unspecified osteoarthritis, unspecified site: Secondary | ICD-10-CM | POA: Diagnosis not present

## 2018-02-10 DIAGNOSIS — J449 Chronic obstructive pulmonary disease, unspecified: Secondary | ICD-10-CM

## 2018-02-11 DIAGNOSIS — M1990 Unspecified osteoarthritis, unspecified site: Secondary | ICD-10-CM | POA: Diagnosis not present

## 2018-02-12 DIAGNOSIS — M1990 Unspecified osteoarthritis, unspecified site: Secondary | ICD-10-CM | POA: Diagnosis not present

## 2018-02-13 DIAGNOSIS — M1990 Unspecified osteoarthritis, unspecified site: Secondary | ICD-10-CM | POA: Diagnosis not present

## 2018-02-14 DIAGNOSIS — M1990 Unspecified osteoarthritis, unspecified site: Secondary | ICD-10-CM | POA: Diagnosis not present

## 2018-02-15 ENCOUNTER — Other Ambulatory Visit: Payer: Self-pay | Admitting: Internal Medicine

## 2018-02-15 DIAGNOSIS — J449 Chronic obstructive pulmonary disease, unspecified: Secondary | ICD-10-CM

## 2018-02-15 DIAGNOSIS — M1990 Unspecified osteoarthritis, unspecified site: Secondary | ICD-10-CM | POA: Diagnosis not present

## 2018-02-16 DIAGNOSIS — M1990 Unspecified osteoarthritis, unspecified site: Secondary | ICD-10-CM | POA: Diagnosis not present

## 2018-02-17 DIAGNOSIS — M1990 Unspecified osteoarthritis, unspecified site: Secondary | ICD-10-CM | POA: Diagnosis not present

## 2018-02-18 DIAGNOSIS — M1990 Unspecified osteoarthritis, unspecified site: Secondary | ICD-10-CM | POA: Diagnosis not present

## 2018-02-19 DIAGNOSIS — M1990 Unspecified osteoarthritis, unspecified site: Secondary | ICD-10-CM | POA: Diagnosis not present

## 2018-02-20 DIAGNOSIS — M1990 Unspecified osteoarthritis, unspecified site: Secondary | ICD-10-CM | POA: Diagnosis not present

## 2018-02-21 DIAGNOSIS — M1990 Unspecified osteoarthritis, unspecified site: Secondary | ICD-10-CM | POA: Diagnosis not present

## 2018-02-22 DIAGNOSIS — M1990 Unspecified osteoarthritis, unspecified site: Secondary | ICD-10-CM | POA: Diagnosis not present

## 2018-02-23 DIAGNOSIS — M1990 Unspecified osteoarthritis, unspecified site: Secondary | ICD-10-CM | POA: Diagnosis not present

## 2018-02-24 DIAGNOSIS — M1990 Unspecified osteoarthritis, unspecified site: Secondary | ICD-10-CM | POA: Diagnosis not present

## 2018-02-25 DIAGNOSIS — M1990 Unspecified osteoarthritis, unspecified site: Secondary | ICD-10-CM | POA: Diagnosis not present

## 2018-02-26 DIAGNOSIS — M1990 Unspecified osteoarthritis, unspecified site: Secondary | ICD-10-CM | POA: Diagnosis not present

## 2018-02-27 DIAGNOSIS — M1990 Unspecified osteoarthritis, unspecified site: Secondary | ICD-10-CM | POA: Diagnosis not present

## 2018-02-28 DIAGNOSIS — M1990 Unspecified osteoarthritis, unspecified site: Secondary | ICD-10-CM | POA: Diagnosis not present

## 2018-03-01 ENCOUNTER — Other Ambulatory Visit: Payer: Self-pay

## 2018-03-01 DIAGNOSIS — M170 Bilateral primary osteoarthritis of knee: Secondary | ICD-10-CM

## 2018-03-01 NOTE — Telephone Encounter (Signed)
HYDROcodone-acetaminophen (NORCO) 7.5-325 MG tablet, REFILL REQUEST @   [id number] [place], [place] [phone] (Phone) [phone] (Fax)

## 2018-03-02 MED ORDER — HYDROCODONE-ACETAMINOPHEN 7.5-325 MG PO TABS: 1.0000 | ORAL_TABLET | Freq: Four times a day (QID) | ORAL | 0 refills | Status: DC | PRN

## 2018-03-08 DIAGNOSIS — M1990 Unspecified osteoarthritis, unspecified site: Secondary | ICD-10-CM | POA: Diagnosis not present

## 2018-03-09 DIAGNOSIS — M1990 Unspecified osteoarthritis, unspecified site: Secondary | ICD-10-CM | POA: Diagnosis not present

## 2018-03-10 ENCOUNTER — Ambulatory Visit (INDEPENDENT_AMBULATORY_CARE_PROVIDER_SITE_OTHER): Payer: Medicare HMO | Admitting: Internal Medicine

## 2018-03-10 ENCOUNTER — Encounter: Payer: Self-pay | Admitting: Internal Medicine

## 2018-03-10 ENCOUNTER — Ambulatory Visit (INDEPENDENT_AMBULATORY_CARE_PROVIDER_SITE_OTHER): Payer: Medicare HMO | Admitting: Dietician

## 2018-03-10 ENCOUNTER — Other Ambulatory Visit: Payer: Self-pay

## 2018-03-10 ENCOUNTER — Encounter: Payer: Self-pay | Admitting: Dietician

## 2018-03-10 VITALS — BP 100/57 | HR 84 | Temp 97.9°F | Ht 73.0 in | Wt 183.4 lb

## 2018-03-10 DIAGNOSIS — E118 Type 2 diabetes mellitus with unspecified complications: Secondary | ICD-10-CM

## 2018-03-10 DIAGNOSIS — E663 Overweight: Secondary | ICD-10-CM

## 2018-03-10 DIAGNOSIS — G250 Essential tremor: Secondary | ICD-10-CM

## 2018-03-10 DIAGNOSIS — Z713 Dietary counseling and surveillance: Secondary | ICD-10-CM | POA: Diagnosis not present

## 2018-03-10 DIAGNOSIS — I10 Essential (primary) hypertension: Secondary | ICD-10-CM | POA: Diagnosis not present

## 2018-03-10 DIAGNOSIS — E119 Type 2 diabetes mellitus without complications: Secondary | ICD-10-CM | POA: Diagnosis not present

## 2018-03-10 DIAGNOSIS — Z23 Encounter for immunization: Secondary | ICD-10-CM

## 2018-03-10 DIAGNOSIS — Z6824 Body mass index (BMI) 24.0-24.9, adult: Secondary | ICD-10-CM | POA: Diagnosis not present

## 2018-03-10 DIAGNOSIS — M1990 Unspecified osteoarthritis, unspecified site: Secondary | ICD-10-CM | POA: Diagnosis not present

## 2018-03-10 DIAGNOSIS — Z79899 Other long term (current) drug therapy: Secondary | ICD-10-CM

## 2018-03-10 DIAGNOSIS — E785 Hyperlipidemia, unspecified: Secondary | ICD-10-CM

## 2018-03-10 DIAGNOSIS — Z9114 Patient's other noncompliance with medication regimen: Secondary | ICD-10-CM

## 2018-03-10 DIAGNOSIS — Z7984 Long term (current) use of oral hypoglycemic drugs: Secondary | ICD-10-CM

## 2018-03-10 LAB — BASIC METABOLIC PANEL
Anion gap: 13 (ref 5–15)
BUN: 17 mg/dL (ref 8–23)
CHLORIDE: 95 mmol/L — AB (ref 98–111)
CO2: 24 mmol/L (ref 22–32)
Calcium: 9.2 mg/dL (ref 8.9–10.3)
Creatinine, Ser: 1.33 mg/dL — ABNORMAL HIGH (ref 0.61–1.24)
GFR calc Af Amer: >60 mL/min (ref >60)
GFR calc non Af Amer: 53 mL/min — ABNORMAL LOW (ref >60)
GLUCOSE: 596 mg/dL — AB (ref 70–99)
POTASSIUM: 4.6 mmol/L (ref 3.5–5.1)
Sodium: 132 mmol/L — ABNORMAL LOW (ref 135–145)

## 2018-03-10 LAB — GLUCOSE, CAPILLARY: GLUCOSE-CAPILLARY: 559 mg/dL — AB (ref 70–99)

## 2018-03-10 LAB — POCT GLYCOSYLATED HEMOGLOBIN (HGB A1C)

## 2018-03-10 MED ORDER — DULAGLUTIDE 0.75 MG/0.5ML ~~LOC~~ SOAJ: 0.7500 mg | SUBCUTANEOUS | 3 refills | Status: DC

## 2018-03-10 NOTE — Patient Instructions (Addendum)
[name]   For your Diabetes - -   Please start Trulicity 0.75mg  injection weekly.  Please come back for monitoring of your glucose in 1 week.   Please come back to see me in 2-3 months for your regular follow up.    Thank you!

## 2018-03-10 NOTE — Progress Notes (Signed)
Diabetes Self-Management Education  Visit Type: Follow-up  Appt. Start Time: 1100 Appt. End Time: 1115  03/10/2018  [name], identified by name and date of birth, is a 67 y.o. male with a diagnosis of Diabetes:  .   ASSESSMENT  Wt Readings from Last 5 Encounters:  03/10/18 183 lb 6.4 oz (83.2 kg)  12/02/17 183 lb (83 kg)  10/08/17 187 lb 8 oz (85 kg)  07/31/17 197 lb 14.4 oz (89.8 kg)  07/08/17 200 lb 4.8 oz (90.9 kg)    Lab Results  Component Value Date   HGBA1C >14.0 (A) 03/10/2018    Documentation for Freestyle Libre Pro Continuous glucose monitoring Freestyle Libre Pro CGM sensor placed and started on [name] who was identified by name and date of birth.  Patient was educated about wearing sensor, keeping food, activity and medication log and when to call office.She was educated about how to care for the sensor and not to have an MRI, CT or Diathermy while wearing the sensorwhat she can learn from the Continuous glucose monitoring. Follow up was arranged with the patient for 1 week to see a doctor and diabetes educator.  Lot #:[id number] Serial #:[id number] Expiration Date:09/20/17 [name], RD 03/10/2018 11:45 AM.   Diabetes Self-Management Education - 03/10/18 1100      Visit Information   Visit Type  Follow-up      Health Coping   How would you rate your overall health?  Fair      Psychosocial Assessment   Patient Belief/Attitude about Diabetes  Defeat/Burnout   frustrated,no matter what he does, it is not controlled   Self-care barriers  Unable to determine    Self-management support  Doctor's office;CDE visits    Patient Concerns  Glycemic Control    Preferred Learning Style  No preference indicated    Learning Readiness  Ready      Pre-Education Assessment   Patient understands using medications safely.  Needs Instruction      Complications   Last HgB A1C per patient/outside source  14 %    How often do you check your blood  sugar?  0 times/day (not testing)    Have you had a dilated eye exam in the past 12 months?  No    Have you had a dental exam in the past 12 months?  No    Are you checking your feet?  Yes    How many days per week are you checking your feet?  7      Dietary Intake   Breakfast  subway breakfast 6" sub    Beverage(s)  gatorade, diet ginger ale,sprite zero, v-8 juice      Exercise   Exercise Type  ADL's;Light (walking / raking leaves)    How many days per week to you exercise?  7    How many minutes per day do you exercise?  20    Total minutes per week of exercise  140      Patient Education   Previous Diabetes Education  Yes (please comment)   here   Medications  Other (comment)   taught to use Trulicity pen,to administer, rotate sites     Individualized Goals (developed by patient)   Medications  take my medication as prescribed      Outcomes   Expected Outcomes  Demonstrated interest in learning. Expect positive outcomes    Future DMSE  Other (comment)   1 week   Program Status  Completed  Subsequent Visit   Since your last visit have you continued or begun to take your medications as prescribed?  No    Since your last visit have you had your blood pressure checked?  No    Since your last visit have you experienced any weight changes?  Loss    Weight Loss (lbs)  4   lost~40# in past 2 years   Since your last visit, are you checking your blood glucose at least once a day?  No       Individualized Plan for Diabetes Self-Management Training:   Learning Objective:  Patient will have a greater understanding of diabetes self-management. Patient education plan is to attend individual and/or group sessions per assessed needs and concerns.   Plan:   Patient Instructions  Please record the time, amount and what food drinks and activities you have while wearing the continuous glucose monitor(CGM).  Bring the folder with you to follow up appointments  Do not have a CT  or an MRI while wearing the CGM.   Please make an appointment for 1 week with me and a doctor for the first of two CGM downloads..   You will also return in 2 weeks to have your second download and the CGM remove  [name] [phone]    Expected Outcomes:  Demonstrated interest in learning. Expect positive outcomes  Education material provided: diabetes medicine sheet and CGM information  If problems or questions, patient to contact team via:  Phone  Future DSME appointment: Other (comment)(1 week)  [name], RD 03/10/2018 11:44 AM.

## 2018-03-10 NOTE — Assessment & Plan Note (Signed)
Blood pressure is 100/57 today, well controlled, denies chest pain, blurry vision, dizziness.   He is taking his diltiazem, propranolol and HCTZ.  Pulse is 84  Plan Continue triple therapy (propranolol is more for ET than HTN) and monitor for bradycardia given CCB and BB.

## 2018-03-10 NOTE — Patient Instructions (Signed)
Please record the time, amount and what food drinks and activities you have while wearing the continuous glucose monitor(CGM).  Bring the folder with you to follow up appointments  Do not have a CT or an MRI while wearing the CGM.   Please make an appointment for 1 week with me and a doctor for the first of two CGM downloads..   You will also return in 2 weeks to have your second download and the CGM remove  Donna [phone] 

## 2018-03-10 NOTE — Progress Notes (Signed)
   Subjective:    Patient ID: [name], male    DOB: May 05, 1951, 67 y.o.   MRN: [id number]  CC: Follow up for DM2  HPI   [name] is a 67yo man with PMH of DM2, HTN, HLD, essential tremor who presents for follow up.  Last seen 8 months ago.  I reviewed his chart with him and he has had uncontrolled blood sugar and A1C for 2 years now.  He has been on multiple oral medications including metformin, januvia, glipizide and briefly Invokana.  He saw reports about SGLT2 inhibitors causing amputations and self stopped this medication.  He is also off of [place] - but I am not sure if this is the case as he is prescribed the combination januvia/metformin pill.  We had a frank discussion today about the risk of continued uncontrolled DM.  He is adamantly against restarting insulin.  We discussed injectable GLP-1 inhibitors today, and the option for weekly dosing, which he was interested in.  He did not bring in his BG log today, and I do not think he is checking his BS at home.  We discussed continuous glucose monitoring, and he was very interested.  Butch Penny, our diabetes coordinator, will speak with him both about trulicity injections and CGM.  He is amenable to coming back in 1 and 2 weeks for follow up of CGM and dose adjustments.   Unfortunately, [name] seems to have little insight into his disease.  He reported initially exercising every day, but then when I spoke with him about his A1C, he reports that he hasn't exercised in 1 month.    He has essential tremor, is on primidone and propranolol without much improvement.  His BP today is 100/57.      Review of Systems  Constitutional: Negative for activity change, appetite change and unexpected weight change (losing weight, reports related to exercise).  Respiratory: Negative for cough and shortness of breath.   Cardiovascular: Negative for chest pain.  Genitourinary: Negative for difficulty urinating, enuresis and frequency.    Neurological: Negative for dizziness and weakness.  Psychiatric/Behavioral: Negative for dysphoric mood.       Objective:   Physical Exam  Constitutional: He is oriented to person, place, and time. He appears well-developed and well-nourished.  Cardiovascular: Normal rate, regular rhythm, normal heart sounds and intact distal pulses.  Pulmonary/Chest: Effort normal and breath sounds normal. No respiratory distress.  Neurological: He is alert and oriented to person, place, and time.  + action tremor in both hands, mild head tremor  Skin: Skin is warm and dry.  Psychiatric: He has a normal mood and affect. His behavior is normal.  Nursing note and vitals reviewed.   Stat bmet today given BS of 559.      Assessment & Plan:  RTC in 1 week for CGM monitoring

## 2018-03-10 NOTE — Progress Notes (Signed)
Call from [facility], main lab - pt's glucose is [phone]. Dr Daryll Drown informed.

## 2018-03-10 NOTE — Assessment & Plan Note (Addendum)
This was the main issue we discussed today.  DM has been uncontrolled now for more than 2 years.  Attempts to get control have included adding oral medications and diet and lifestyle.  Patient has been on insulin in the past and does not wish to return to this modality.  He reports stopping invokana because of possible adverse events.  He reports to me that he stopped [place] and is on metformin alone, but I do not see an Rx for metformin alone so I wonder if he is still on Janumet.  We discussed non insulin injectables today including Luraglutide and dulaglutide.  He liked the idea of once weekly dosing so we decided on dulaglutide.  He met with our diabetes coordinator and also had a CGM placed to help with medication adjustment as well.  He has lost weight, but I think this is likely more related to prolonged uncontrolled DM.   Plan START dulaglutide weekly CGM monitoring, follow up in 1 week After CGM monitoring, follow up in 3 months for A1C BP is well controlled.   Last LDL was low - he is on pravastatin

## 2018-03-11 DIAGNOSIS — M1990 Unspecified osteoarthritis, unspecified site: Secondary | ICD-10-CM | POA: Diagnosis not present

## 2018-03-12 DIAGNOSIS — M1990 Unspecified osteoarthritis, unspecified site: Secondary | ICD-10-CM | POA: Diagnosis not present

## 2018-03-13 ENCOUNTER — Other Ambulatory Visit: Payer: Self-pay | Admitting: Internal Medicine

## 2018-03-13 DIAGNOSIS — M1990 Unspecified osteoarthritis, unspecified site: Secondary | ICD-10-CM | POA: Diagnosis not present

## 2018-03-13 DIAGNOSIS — E119 Type 2 diabetes mellitus without complications: Secondary | ICD-10-CM

## 2018-03-14 DIAGNOSIS — M1990 Unspecified osteoarthritis, unspecified site: Secondary | ICD-10-CM | POA: Diagnosis not present

## 2018-03-15 DIAGNOSIS — M1990 Unspecified osteoarthritis, unspecified site: Secondary | ICD-10-CM | POA: Diagnosis not present

## 2018-03-16 ENCOUNTER — Encounter: Payer: Self-pay | Admitting: Internal Medicine

## 2018-03-16 DIAGNOSIS — M1990 Unspecified osteoarthritis, unspecified site: Secondary | ICD-10-CM | POA: Diagnosis not present

## 2018-03-16 NOTE — Assessment & Plan Note (Signed)
Stable, no changes today.  He continues to have tremor, feels the medications only help a little bit.   Plan Continue primidone and propranolol at this time.

## 2018-03-16 NOTE — Assessment & Plan Note (Signed)
Weight has improved so that he is now normal weight. I am concerned this is related to his very uncontrolled DM.  Will plan to continue to monitor.  He was placed on a GLP-1 which might help him to further decrease weight.

## 2018-03-17 ENCOUNTER — Other Ambulatory Visit: Payer: Self-pay

## 2018-03-17 ENCOUNTER — Ambulatory Visit (INDEPENDENT_AMBULATORY_CARE_PROVIDER_SITE_OTHER): Payer: Medicare HMO | Admitting: Internal Medicine

## 2018-03-17 ENCOUNTER — Encounter: Payer: Self-pay | Admitting: Dietician

## 2018-03-17 ENCOUNTER — Ambulatory Visit: Payer: Medicare HMO | Admitting: Dietician

## 2018-03-17 VITALS — BP 98/66 | HR 92 | Temp 97.9°F | Ht 73.0 in | Wt 182.9 lb

## 2018-03-17 DIAGNOSIS — Z7984 Long term (current) use of oral hypoglycemic drugs: Secondary | ICD-10-CM

## 2018-03-17 DIAGNOSIS — E785 Hyperlipidemia, unspecified: Secondary | ICD-10-CM | POA: Diagnosis not present

## 2018-03-17 DIAGNOSIS — J449 Chronic obstructive pulmonary disease, unspecified: Secondary | ICD-10-CM

## 2018-03-17 DIAGNOSIS — M1990 Unspecified osteoarthritis, unspecified site: Secondary | ICD-10-CM | POA: Diagnosis not present

## 2018-03-17 DIAGNOSIS — E119 Type 2 diabetes mellitus without complications: Secondary | ICD-10-CM | POA: Diagnosis not present

## 2018-03-17 DIAGNOSIS — E118 Type 2 diabetes mellitus with unspecified complications: Secondary | ICD-10-CM

## 2018-03-17 DIAGNOSIS — I10 Essential (primary) hypertension: Secondary | ICD-10-CM | POA: Diagnosis not present

## 2018-03-17 MED ORDER — METFORMIN HCL 500 MG PO TABS: 1000.0000 mg | ORAL_TABLET | Freq: Two times a day (BID) | ORAL | 3 refills | Status: DC

## 2018-03-17 MED ORDER — GLIPIZIDE 10 MG PO TABS: ORAL_TABLET | ORAL | 3 refills | Status: AC

## 2018-03-17 NOTE — Progress Notes (Signed)
Diabetes Self-Management Education  Visit Type:  Follow-up  Appt. Start Time: 1346 Appt. End Time: 1031  03/17/2018  [name], identified by name and date of birth, is a 67 y.o. male with a diagnosis of Diabetes:  Marland Kitchen  Type 2 ASSESSMENT  Got Trulicity no side effects, gave first dose Thursday. He did not bring a meter, he is moving and is not checking his blood sugar at home. Did not bring the food and activity record. He reports taking all his diabetes medicine without prompting.   Feels tired, sits a lot, feels he needs to exercise more but doesn't have the energy,  doesn't get full/ Eats 1-3 times a day, drinks water, diet sodas and occassional decaf coffee. BMI is within normal, but unintentional 15# weight loss in past 7 months is concerning.   Estimated body mass index is 24.13 kg/m as calculated from the following:   Height as of 03/10/18: 6\' 1"  (1.854 m).   Weight as of this encounter: 182 lb 14.4 oz (83 kg).  Wt Readings from Last 5 Encounters:  03/17/18 182 lb 14.4 oz (83 kg)  03/10/18 183 lb 6.4 oz (83.2 kg)  12/02/17 183 lb (83 kg)  10/08/17 187 lb 8 oz (85 kg)  07/31/17 197 lb 14.4 oz (89.8 kg)      Weight 182 lb 14.4 oz (83 kg). Body mass index is 24.13 kg/m.   Diabetes Self-Management Education - 03/17/18 1400      Health Coping   How would you rate your overall health?  Poor      Patient Education   Disease state   Explored patient's options for treatment of their diabetes    Acute complications  Discussed and identified patients' treatment of hyperglycemia.      Patient Self-Evaluation of Goals - Patient rates self as meeting previously set goals (% of time)   Medications  >75%      Outcomes   Program Status  Completed      Subsequent Visit   Since your last visit have you continued or begun to take your medications as prescribed?  Yes    Since your last visit have you had your blood pressure checked?  No    Since your last visit have you  experienced any weight changes?  Loss    Weight Loss (lbs)  1    Since your last visit, are you checking your blood glucose at least once a day?  No       Learning Objective:  Patient will have a greater understanding of diabetes self-management. Patient education plan is to attend individual and/or group sessions per assessed needs and concerns.   Plan:   Patient Instructions  Please make an appointment with me and doctor for 1 week and to remove the sensor.   Butch Penny [phone]    Expected Outcomes:  Demonstrated interest in learning. Expect positive outcomes Education material provided: AVS If problems or questions, patient to contact team via:  Phone Future DSME appointment: - (1 week)  Debera Lat, RD 03/17/2018 2:26 PM.

## 2018-03-17 NOTE — Progress Notes (Signed)
   CC: CGM follow-up  HPI:  Mr.Pauline B Billy is a 67 y.o. male with a history of HLD, HTN, COPD, T2DM, and OA who presents for CGM follow-up.  T2DM Mr. Evitt was seen last week in clinic and was found to have A1c >14. He has been on Janumet 48-1856 mg BID and Trulicity (started last week). He also believes that he is on Metformin alone although I do no see an EX for this medication. He was supposed to be on glipizide 10 mg QD but believed that he was told to discontinue this at last clinic visit and has thus not taken it. He is very against taking insulin stating that it was very painful and he does not believe that it helped his diabetes that much.   Kirk Ruths wore the CGM for 8 days. The average reading was 396, % time in target was 0, % time below target was 0, and 100% time above target was.  He denies any new symptoms today.  Regular metformin D/C Janumet REstrat glipizide Trulicity at same dose  Past Medical History:  Diagnosis Date  . Baker's cyst, ruptured   . COPD (chronic obstructive pulmonary disease) ([place])   . Diabetes mellitus, type 2 ([place])   . Erectile dysfunction   . History of tobacco abuse    Year started: 47   Year quit: 01/2007  . Hyperlipidemia   . Hypertension   . Knee osteoarthritis    Bilateral,   Patient has been evaluated by Ortho for knee replacement in the past  . Lipoma of skin   . Osteoarthritis   . PSVT (paroxysmal supraventricular tachycardia) ([place])    Required adenosine 2006  . Trochanteric bursitis of left hip    Review of Systems:   Review of Systems  Constitutional: Negative for chills and fever.  HENT: Negative for congestion, sinus pain and sore throat.   Respiratory: Negative for cough and shortness of breath.   Cardiovascular: Negative for chest pain, palpitations and leg swelling.  Gastrointestinal: Negative for abdominal pain, constipation, diarrhea, nausea and vomiting.  Genitourinary: Negative for dysuria  and hematuria.  Skin: Negative for rash.  Neurological: Negative for dizziness, weakness and headaches.    Physical Exam:  Vitals:   03/17/18 1422  BP: 98/66  Pulse: 92  Temp: 97.9 F (36.6 C)  TempSrc: Oral  SpO2: 100%  Weight: 182 lb 14.4 oz (83 kg)  Height: 6\' 1"  (1.854 m)   Physical Exam  Constitutional: He appears well-developed and well-nourished.  HENT:  Head: Normocephalic and atraumatic.  Eyes: EOM are normal.  Neck: Normal range of motion.  Cardiovascular: Normal rate, regular rhythm and normal heart sounds.  Pulmonary/Chest: Effort normal and breath sounds normal.  Musculoskeletal: He exhibits no edema.  Neurological: He is alert.  Skin: Skin is warm and dry.  Psychiatric: He has a normal mood and affect. His behavior is normal.  Nursing note and vitals reviewed.   Assessment & Plan:   See Encounters Tab for problem based charting.  Patient seen with Dr. Daryll Drown

## 2018-03-17 NOTE — Patient Instructions (Signed)
Please make an appointment with me and doctor for 1 week and to remove the sensor.   Butch Penny [phone]

## 2018-03-17 NOTE — Patient Instructions (Addendum)
Diabetes medications: 1. Keep taking Trulicity once a week 2. Stop taking Janumet. 3. Start Metformin 500 mg (take 2 tablets in the morning and 2 tablets at night) 4. Restart glipizide 10 mg (take 2 tablets in the morning and 1 in the evening) 5. Follow up in 1 week to recheck CGM monitor

## 2018-03-18 DIAGNOSIS — M1990 Unspecified osteoarthritis, unspecified site: Secondary | ICD-10-CM | POA: Diagnosis not present

## 2018-03-19 DIAGNOSIS — M1990 Unspecified osteoarthritis, unspecified site: Secondary | ICD-10-CM | POA: Diagnosis not present

## 2018-03-20 DIAGNOSIS — M1990 Unspecified osteoarthritis, unspecified site: Secondary | ICD-10-CM | POA: Diagnosis not present

## 2018-03-21 DIAGNOSIS — M1990 Unspecified osteoarthritis, unspecified site: Secondary | ICD-10-CM | POA: Diagnosis not present

## 2018-03-22 DIAGNOSIS — M1990 Unspecified osteoarthritis, unspecified site: Secondary | ICD-10-CM | POA: Diagnosis not present

## 2018-03-22 NOTE — Assessment & Plan Note (Signed)
Assessment: Mr. Groman has continued to have uncontrolled diabetes (average reading from CGM: 396 with 100% of time above goal) while on Janumet 71-5953 mg BID and Trulicity (started last week). He continues to be very apprehensive about starting insulin but has had no problems with the once a week injection of Trulicity.   Plan: 1. Continue Trulicity weekly 2. Stop Janumet 3. Start Metformin 1000 mg BID 4. Restart glipizide 10 mg (2 tablets in the morning and 1 in the evening) 5. Follow up in 1 week to recheck CGM monitor

## 2018-03-23 ENCOUNTER — Other Ambulatory Visit: Payer: Self-pay | Admitting: Internal Medicine

## 2018-03-23 DIAGNOSIS — M1990 Unspecified osteoarthritis, unspecified site: Secondary | ICD-10-CM | POA: Diagnosis not present

## 2018-03-23 NOTE — Progress Notes (Signed)
Internal Medicine Clinic Attending  I saw and evaluated the patient.  I personally confirmed the key portions of the history and exam documented by Dr. Prince and I reviewed pertinent patient test results.  The assessment, diagnosis, and plan were formulated together and I agree with the documentation in the resident's note.  

## 2018-03-24 ENCOUNTER — Ambulatory Visit (INDEPENDENT_AMBULATORY_CARE_PROVIDER_SITE_OTHER): Payer: Medicare HMO | Admitting: Internal Medicine

## 2018-03-24 ENCOUNTER — Ambulatory Visit: Payer: Medicare HMO | Admitting: Dietician

## 2018-03-24 ENCOUNTER — Other Ambulatory Visit: Payer: Self-pay

## 2018-03-24 ENCOUNTER — Encounter: Payer: Self-pay | Admitting: Internal Medicine

## 2018-03-24 ENCOUNTER — Encounter: Payer: Self-pay | Admitting: Dietician

## 2018-03-24 VITALS — BP 102/69 | HR 92 | Temp 98.0°F | Ht 73.0 in | Wt 184.4 lb

## 2018-03-24 DIAGNOSIS — Z7984 Long term (current) use of oral hypoglycemic drugs: Secondary | ICD-10-CM | POA: Diagnosis not present

## 2018-03-24 DIAGNOSIS — E119 Type 2 diabetes mellitus without complications: Secondary | ICD-10-CM

## 2018-03-24 DIAGNOSIS — M1990 Unspecified osteoarthritis, unspecified site: Secondary | ICD-10-CM | POA: Diagnosis not present

## 2018-03-24 MED ORDER — DULAGLUTIDE 1.5 MG/0.5ML ~~LOC~~ SOAJ: 1.5000 mg | SUBCUTANEOUS | 2 refills | Status: AC

## 2018-03-24 NOTE — Assessment & Plan Note (Signed)
He wore the CGM for 14 days. The average reading was 348, % time in target was 1, % time below target was 0, and % time above target was. 99. Intervention will be to increase Trulicity from .75mg /ml to 1.5mg /ml. The patient will be scheduled to return in one month for A1C check.  Increase trulicity to 1.5mg /.4ml per week Continue metformin 1000 mg bid Continue glipizide 10 mg two in the morning and one in the evening  Follow-up in one month for A1C check

## 2018-03-24 NOTE — Patient Instructions (Addendum)
Thank you for coming to the clinic today. It was a pleasure to see you.   For your type II diabetes mellitus:  Please STOP injecting trulicity .75mg /wk.  Please START injecting trulicity 1.5 mg/wk subcutaneously.   Please return in one month to have your Hb A1C check.

## 2018-03-24 NOTE — Progress Notes (Signed)
CGM download week #2 Documentation: Time spent in education 10 minutes  Blood sugars improving on new diabetes medicine regemin. He reports no problems. Per the Continuous glucose monitoring download week 2 blood sugar are decreased when compard to week 1 Mr. Lax reports exercising by packing to move and eating moderate portions and drinking no sugar beverages.He drinks mostly water.  Counseled on high potassium foods to limit per his request. Recommend increased medicine dose of Trulicity for diabetes to assist with lowering blood sugars. Discussed with Dr. Sharon Seller. Debera Lat, RD 03/24/2018 4:39 PM.

## 2018-03-24 NOTE — Progress Notes (Signed)
   CC: CGM Removal   HPI:  [name] is a 67 y.o. with PMH as below.   Please see A&P for assessment of the patient's chronic medical conditions.    Kirk Ruths wore the CGM for 14 days. The average reading was 348, % time in target was 1, % time below target was 0, and % time above target was. 99. Intervention will be to increase Trulicity from .75mg /ml to 1.5mg /ml. The patient will be scheduled to return in one month for A1C check.   Past Medical History:  Diagnosis Date  . Baker's cyst, ruptured   . COPD (chronic obstructive pulmonary disease) ([place])   . Diabetes mellitus, type 2 ([place])   . Erectile dysfunction   . History of tobacco abuse    Year started: 30   Year quit: 01/2007  . Hyperlipidemia   . Hypertension   . Knee osteoarthritis    Bilateral,   Patient has been evaluated by Ortho for knee replacement in the past  . Lipoma of skin   . Osteoarthritis   . PSVT (paroxysmal supraventricular tachycardia) ([place])    Required adenosine 2006  . Trochanteric bursitis of left hip    Review of Systems:    Review of Systems  Constitutional: Negative for chills, diaphoresis and fever.  HENT: Negative for congestion, sinus pain and sore throat.   Eyes: Negative for blurred vision, double vision, photophobia and pain.  Respiratory: Negative for cough, hemoptysis and sputum production.   Cardiovascular: Negative for chest pain, palpitations and leg swelling.  Gastrointestinal: Negative for abdominal pain, diarrhea, nausea and vomiting.  Neurological: Negative for dizziness, sensory change, speech change and headaches.     Physical Exam:  Constitution: NAD, sitting comfortably HENT: AC, [place] Eyes: no scleral icterus, EOM intact Cardio: RRR, no pitting edema Respiratory: clear to auscultation, non-labored breathing  Abdominal: soft, NTTP Neuro: A&Ox3, pleasant Skin: c/d/i   Vitals:   03/24/18 1044  BP: 102/69  Pulse: 92  Temp: 98 F (36.7 C)    TempSrc: Oral  SpO2: 100%  Weight: 184 lb 6.4 oz (83.6 kg)  Height: 6\' 1"  (1.854 m)     Assessment & Plan:   See Encounters Tab for problem based charting.  Patient seen with Dr. Eppie Gibson

## 2018-03-25 DIAGNOSIS — M1990 Unspecified osteoarthritis, unspecified site: Secondary | ICD-10-CM | POA: Diagnosis not present

## 2018-03-25 NOTE — Progress Notes (Signed)
I saw and evaluated the patient.  I personally confirmed the key portions of Dr. Aurelio Jew history and exam and reviewed the continuous glucose monitoring data with her.  The assessment, diagnosis, and plan were formulated together and I agree with the documentation in the resident's note.

## 2018-03-26 DIAGNOSIS — M1990 Unspecified osteoarthritis, unspecified site: Secondary | ICD-10-CM | POA: Diagnosis not present

## 2018-03-27 DIAGNOSIS — M1990 Unspecified osteoarthritis, unspecified site: Secondary | ICD-10-CM | POA: Diagnosis not present

## 2018-03-28 DIAGNOSIS — M1990 Unspecified osteoarthritis, unspecified site: Secondary | ICD-10-CM | POA: Diagnosis not present

## 2018-03-29 DIAGNOSIS — M1990 Unspecified osteoarthritis, unspecified site: Secondary | ICD-10-CM | POA: Diagnosis not present

## 2018-03-30 DIAGNOSIS — M1990 Unspecified osteoarthritis, unspecified site: Secondary | ICD-10-CM | POA: Diagnosis not present

## 2018-03-31 DIAGNOSIS — M1990 Unspecified osteoarthritis, unspecified site: Secondary | ICD-10-CM | POA: Diagnosis not present

## 2018-04-01 DIAGNOSIS — M1990 Unspecified osteoarthritis, unspecified site: Secondary | ICD-10-CM | POA: Diagnosis not present

## 2018-04-02 DIAGNOSIS — M1990 Unspecified osteoarthritis, unspecified site: Secondary | ICD-10-CM | POA: Diagnosis not present

## 2018-04-03 DIAGNOSIS — M1990 Unspecified osteoarthritis, unspecified site: Secondary | ICD-10-CM | POA: Diagnosis not present

## 2018-04-04 DIAGNOSIS — M1990 Unspecified osteoarthritis, unspecified site: Secondary | ICD-10-CM | POA: Diagnosis not present

## 2018-04-05 DIAGNOSIS — M1990 Unspecified osteoarthritis, unspecified site: Secondary | ICD-10-CM | POA: Diagnosis not present

## 2018-04-06 DIAGNOSIS — M1990 Unspecified osteoarthritis, unspecified site: Secondary | ICD-10-CM | POA: Diagnosis not present

## 2018-04-07 DIAGNOSIS — M1990 Unspecified osteoarthritis, unspecified site: Secondary | ICD-10-CM | POA: Diagnosis not present

## 2018-04-08 DIAGNOSIS — M1990 Unspecified osteoarthritis, unspecified site: Secondary | ICD-10-CM | POA: Diagnosis not present

## 2018-04-09 DIAGNOSIS — M1990 Unspecified osteoarthritis, unspecified site: Secondary | ICD-10-CM | POA: Diagnosis not present

## 2018-04-10 DIAGNOSIS — M1990 Unspecified osteoarthritis, unspecified site: Secondary | ICD-10-CM | POA: Diagnosis not present

## 2018-04-11 ENCOUNTER — Emergency Department (HOSPITAL_COMMUNITY)
Admission: EM | Admit: 2018-04-11 | Discharge: 2018-04-11 | Disposition: A | Discharge status: Home / Self Care | Payer: Medicare HMO | Attending: Emergency Medicine | Admitting: Emergency Medicine

## 2018-04-11 ENCOUNTER — Emergency Department (HOSPITAL_COMMUNITY): Payer: Medicare HMO

## 2018-04-11 ENCOUNTER — Encounter (HOSPITAL_COMMUNITY): Payer: Self-pay | Admitting: Emergency Medicine

## 2018-04-11 DIAGNOSIS — Z7984 Long term (current) use of oral hypoglycemic drugs: Secondary | ICD-10-CM | POA: Diagnosis not present

## 2018-04-11 DIAGNOSIS — Z79899 Other long term (current) drug therapy: Secondary | ICD-10-CM | POA: Diagnosis not present

## 2018-04-11 DIAGNOSIS — Z7982 Long term (current) use of aspirin: Secondary | ICD-10-CM | POA: Diagnosis not present

## 2018-04-11 DIAGNOSIS — Y9222 Religious institution as the place of occurrence of the external cause: Secondary | ICD-10-CM | POA: Insufficient documentation

## 2018-04-11 DIAGNOSIS — Z87891 Personal history of nicotine dependence: Secondary | ICD-10-CM | POA: Diagnosis not present

## 2018-04-11 DIAGNOSIS — W01198A Fall on same level from slipping, tripping and stumbling with subsequent striking against other object, initial encounter: Secondary | ICD-10-CM | POA: Insufficient documentation

## 2018-04-11 DIAGNOSIS — S8991XA Unspecified injury of right lower leg, initial encounter: Secondary | ICD-10-CM | POA: Diagnosis not present

## 2018-04-11 DIAGNOSIS — Y9301 Activity, walking, marching and hiking: Secondary | ICD-10-CM | POA: Diagnosis not present

## 2018-04-11 DIAGNOSIS — Y999 Unspecified external cause status: Secondary | ICD-10-CM | POA: Insufficient documentation

## 2018-04-11 DIAGNOSIS — S99921A Unspecified injury of right foot, initial encounter: Secondary | ICD-10-CM | POA: Diagnosis not present

## 2018-04-11 DIAGNOSIS — S8001XA Contusion of right knee, initial encounter: Secondary | ICD-10-CM

## 2018-04-11 DIAGNOSIS — M79671 Pain in right foot: Secondary | ICD-10-CM | POA: Diagnosis not present

## 2018-04-11 DIAGNOSIS — I10 Essential (primary) hypertension: Secondary | ICD-10-CM | POA: Insufficient documentation

## 2018-04-11 DIAGNOSIS — M79604 Pain in right leg: Secondary | ICD-10-CM | POA: Diagnosis not present

## 2018-04-11 DIAGNOSIS — J449 Chronic obstructive pulmonary disease, unspecified: Secondary | ICD-10-CM | POA: Insufficient documentation

## 2018-04-11 DIAGNOSIS — E119 Type 2 diabetes mellitus without complications: Secondary | ICD-10-CM | POA: Insufficient documentation

## 2018-04-11 DIAGNOSIS — W19XXXA Unspecified fall, initial encounter: Secondary | ICD-10-CM

## 2018-04-11 DIAGNOSIS — M1990 Unspecified osteoarthritis, unspecified site: Secondary | ICD-10-CM | POA: Diagnosis not present

## 2018-04-11 DIAGNOSIS — S79911A Unspecified injury of right hip, initial encounter: Secondary | ICD-10-CM | POA: Diagnosis not present

## 2018-04-11 DIAGNOSIS — M25561 Pain in right knee: Secondary | ICD-10-CM | POA: Diagnosis not present

## 2018-04-11 MED ORDER — TRAMADOL HCL 50 MG PO TABS: 50.0000 mg | ORAL_TABLET | Freq: Four times a day (QID) | ORAL | 0 refills | Status: DC | PRN

## 2018-04-11 NOTE — Discharge Instructions (Signed)
Return here as needed. Follow up with your doctor. °

## 2018-04-11 NOTE — ED Notes (Signed)
Pt stable, ambulatory, and verbalizes understanding of d/c instructions.  

## 2018-04-11 NOTE — ED Notes (Signed)
Patient transported to X-ray 

## 2018-04-11 NOTE — ED Triage Notes (Addendum)
Pt reports he slipped on a wet floor at [employer] and fell onto his R knee. Pt denies any other injuries. HR of 136 in triage, resp e/u, nad.

## 2018-04-11 NOTE — ED Notes (Signed)
Pt returned from xray

## 2018-04-12 DIAGNOSIS — M1990 Unspecified osteoarthritis, unspecified site: Secondary | ICD-10-CM | POA: Diagnosis not present

## 2018-04-13 DIAGNOSIS — M1990 Unspecified osteoarthritis, unspecified site: Secondary | ICD-10-CM | POA: Diagnosis not present

## 2018-04-14 DIAGNOSIS — M1990 Unspecified osteoarthritis, unspecified site: Secondary | ICD-10-CM | POA: Diagnosis not present

## 2018-04-15 DIAGNOSIS — M1990 Unspecified osteoarthritis, unspecified site: Secondary | ICD-10-CM | POA: Diagnosis not present

## 2018-04-16 DIAGNOSIS — M1990 Unspecified osteoarthritis, unspecified site: Secondary | ICD-10-CM | POA: Diagnosis not present

## 2018-04-16 NOTE — ED Provider Notes (Signed)
[facility] EMERGENCY DEPARTMENT Provider Note   CSN: [id number] Arrival date & time: 04/11/18  0017     History   Chief Complaint Chief Complaint  Patient presents with  . Fall  . Tachycardia    HPI [name] is a 67 y.o. male.  HPI Patient presents to the emergency department with a right knee injury after falling at church.  Patient states he slipped and fell on his knee at church.  The patient states that certain movements palpation make the pain worse.  Patient states he did not take any medications prior to arrival for his symptoms.  Patient denies any chest pain, shortness of breath, nausea, vomiting, abdominal pain, weakness, dizziness, headache, blurred vision, fever, or syncope. Past Medical History:  Diagnosis Date  . Baker's cyst, ruptured   . COPD (chronic obstructive pulmonary disease) ([place])   . Diabetes mellitus, type 2 ([place])   . Erectile dysfunction   . History of tobacco abuse    Year started: 43   Year quit: 01/2007  . Hyperlipidemia   . Hypertension   . Knee osteoarthritis    Bilateral,   Patient has been evaluated by Ortho for knee replacement in the past  . Lipoma of skin   . Osteoarthritis   . PSVT (paroxysmal supraventricular tachycardia) ([place])    Required adenosine 2006  . Trochanteric bursitis of left hip     Patient Active Problem List   Diagnosis Date Noted  . Pain of ear structure, left 10/08/2017  . Overweight (BMI 25.0-29.9) 08/07/2017  . Former heavy tobacco smoker 12/10/2016  . Hypertensive retinopathy 05/22/2015  . Nuclear sclerotic cataract 05/22/2015  . Atherosclerosis of aorta ([place]) 12/27/2014  . Essential tremor 09/18/2011  . Preventative health care 04/09/2011  . L-S radiculopathy 07/25/2010  . Hyperlipidemia 07/05/2008  . Diabetes mellitus, type 2 ([place]) 06/21/2008  . Essential hypertension, benign 07/08/2007  . Osteoarthritis 06/18/2006    History reviewed. No pertinent surgical  history.      Home Medications    Prior to Admission medications   Medication Sig Start Date End Date Taking? Authorizing Provider  albuterol (VENTOLIN HFA) 108 (90 Base) MCG/ACT inhaler INHALE 2 PUFFS INTO THE LUNGS EVERY 6 HOURS AS NEEDED FOR SHORTNESS OF BREATH OR WHEEZING Patient taking differently: Inhale 2 puffs into the lungs every 6 (six) hours as needed.  02/16/18  Yes Sid Falcon, MD  aspirin (ANACIN) 81 MG EC tablet Take 1 tablet (81 mg total) by mouth daily. 01/08/11  Yes Hester Mates, MD  diltiazem (CARDIZEM CD) 240 MG 24 hr capsule TAKE ONE CAPSULE BY MOUTH EVERY DAY Patient taking differently: Take 240 mg by mouth daily.  09/22/17  Yes Bartholomew Crews, MD  Dulaglutide 1.5 MG/0.5ML SOPN Inject 1.5 mg into the skin once a week. 03/24/18 04/23/18 Yes Seawell, Jaimie A, DO  glipiZIDE (GLUCOTROL) 10 MG tablet TAKE 2 TABLETS BY MOUTH EVERY MORNING TAKE 1 TABLET BY MOUTH EVERY EVENING Patient taking differently: Take 10-20 mg by mouth See admin instructions. TAKE 20 mg TABLETS BY MOUTH EVERY MORNING TAKE 10 mg TABLET BY MOUTH EVERY EVENING 03/17/18  Yes Carroll Sage, MD  hydrochlorothiazide (MICROZIDE) 12.5 MG capsule Take 1 capsule (12.5 mg total) by mouth daily. 01/18/18  Yes Sid Falcon, MD  HYDROcodone-acetaminophen (NORCO) 7.5-325 MG tablet Take 1 tablet by mouth every 6 (six) hours as needed (for pain). 03/02/18  Yes Sid Falcon, MD  metFORMIN (GLUCOPHAGE) 500 MG tablet Take  2 tablets (1,000 mg total) by mouth 2 (two) times daily with a meal. 03/17/18  Yes Carroll Sage, MD  pravastatin (PRAVACHOL) 40 MG tablet TAKE 1 TABLET BY MOUTH IN THE EVENING EVERY DAY Patient taking differently: Take 40 mg by mouth at bedtime.  03/23/18  Yes Sid Falcon, MD  primidone (MYSOLINE) 50 MG tablet Take 1 tablet (50 mg total) by mouth at bedtime. 10/08/17  Yes Oval Linsey, MD  propranolol (INDERAL) 80 MG tablet Take 1 tablet (80 mg total) by mouth 2 (two) times daily. 10/08/17   Yes Oval Linsey, MD  SPIRIVA HANDIHALER 18 MCG inhalation capsule PLACE 1 CAPSULE INTO INHALER AND INHALE DAILY. Patient taking differently: Place 18 mcg into inhaler and inhale daily.  04/21/17  Yes Sid Falcon, MD  ACCU-CHEK FASTCLIX LANCETS MISC Check blood sugar 3 times daily before meal or bedtime 03/19/16   Sid Falcon, MD  Blood Glucose Calibration (ACCU-CHEK AVIVA) SOLN  04/15/16   [provider]  Blood Glucose Monitoring Suppl (ACCU-CHEK GUIDE) w/Device KIT 1 each by Does not apply route 3 (three) times daily. 03/19/16   Sid Falcon, MD  glucose blood (ACCU-CHEK GUIDE) test strip Check blood sugar 3 times daily before meal or bedtime 03/19/16   Sid Falcon, MD  HYDROcodone-acetaminophen (NORCO) 7.5-325 MG tablet Take 1 tablet by mouth every 6 (six) hours as needed for moderate pain. Patient not taking: Reported on 04/11/2018 03/02/18   Sid Falcon, MD  HYDROcodone-acetaminophen [facility]) 7.5-325 MG tablet Take 1 tablet by mouth every 6 (six) hours as needed for moderate pain. Patient not taking: Reported on 04/11/2018 03/02/18   Sid Falcon, MD  Lancet Devices (ADJUSTABLE LANCING DEVICE) MISC  04/15/16   [provider]  traMADol (ULTRAM) 50 MG tablet Take 1 tablet (50 mg total) by mouth every 6 (six) hours as needed for severe pain. 04/11/18   Dalia Heading, PA-C    Family History Family History  Problem Relation Age of Onset  . Cancer Mother        Throat cancer  . Cirrhosis Father   . Alcohol abuse Father     Social History Social History   Tobacco Use  . Smoking status: Former Smoker    Packs/day: 1.00    Years: 47.00    Pack years: 47.00    Types: Cigarettes    Last attempt to quit: 01/23/2007    Years since quitting: 11.2  . Smokeless tobacco: Never Used  . Tobacco comment: Smoked 1 ppd from 1961-2008, quit 01/23/2007  Substance Use Topics  . Alcohol use: No    Alcohol/week: 0.0 standard drinks  . Drug use: No      Allergies   Latex   Review of Systems Review of Systems All other systems negative except as documented in the HPI. All pertinent positives and negatives as reviewed in the HPI.  Physical Exam Updated Vital Signs BP 117/79   Pulse 83   Temp 98.5 F (36.9 C) (Oral)   Resp 20   Ht _0  (1.854 m)   Wt 92.1 kg   SpO2 98%   BMI 26.78 kg/m   Physical Exam  Constitutional: He is oriented to person, place, and time. He appears well-developed and well-nourished. No distress.  HENT:  Head: Normocephalic and atraumatic.  Eyes: Pupils are equal, round, and reactive to light.  Pulmonary/Chest: Effort normal.  Musculoskeletal:       Right knee: He exhibits no swelling, no effusion,  no ecchymosis and no deformity. Tenderness found.  Neurological: He is alert and oriented to person, place, and time. Coordination normal.  Skin: Skin is warm and dry.  Psychiatric: He has a normal mood and affect.  Nursing note and vitals reviewed.    ED Treatments / Results  Labs (all labs ordered are listed, but only abnormal results are displayed) Labs Reviewed - No data to display  EKG None  Radiology No results found.  Procedures Procedures (including critical care time)  Medications Ordered in ED Medications - No data to display   Initial Impression / Assessment and Plan / ED Course  I have reviewed the triage vital signs and the nursing notes.  Pertinent labs & imaging results that were available during my care of the patient were reviewed by me and considered in my medical decision making (see chart for details).     He does not have any abnormalities noted on his x-ray.  The patient be treated for his pain.  Told to ice and elevate the knee.  He is given referral to orthopedics as well.  Patient agrees the plan and all questions were answered.  Patient is able to ambulate without significant difficulties. Final Clinical Impressions(s) / ED Diagnoses   Final diagnoses:   Fall, initial encounter  Contusion of right knee, initial encounter    ED Discharge Orders         Ordered    traMADol (ULTRAM) 50 MG tablet  Every 6 hours PRN     04/11/18 1509           Dalia Heading, PA-C 04/16/18 6168    Jola Schmidt, MD 04/16/18 1008

## 2018-04-17 DIAGNOSIS — M1990 Unspecified osteoarthritis, unspecified site: Secondary | ICD-10-CM | POA: Diagnosis not present

## 2018-04-18 ENCOUNTER — Other Ambulatory Visit: Payer: Self-pay | Admitting: Internal Medicine

## 2018-04-18 DIAGNOSIS — M1990 Unspecified osteoarthritis, unspecified site: Secondary | ICD-10-CM | POA: Diagnosis not present

## 2018-04-19 DIAGNOSIS — M1990 Unspecified osteoarthritis, unspecified site: Secondary | ICD-10-CM | POA: Diagnosis not present

## 2018-04-20 DIAGNOSIS — M1990 Unspecified osteoarthritis, unspecified site: Secondary | ICD-10-CM | POA: Diagnosis not present

## 2018-04-21 DIAGNOSIS — M1990 Unspecified osteoarthritis, unspecified site: Secondary | ICD-10-CM | POA: Diagnosis not present

## 2018-04-22 DIAGNOSIS — M1990 Unspecified osteoarthritis, unspecified site: Secondary | ICD-10-CM | POA: Diagnosis not present

## 2018-04-23 DIAGNOSIS — M1990 Unspecified osteoarthritis, unspecified site: Secondary | ICD-10-CM | POA: Diagnosis not present

## 2018-04-24 DIAGNOSIS — M1990 Unspecified osteoarthritis, unspecified site: Secondary | ICD-10-CM | POA: Diagnosis not present

## 2018-04-25 DIAGNOSIS — M1990 Unspecified osteoarthritis, unspecified site: Secondary | ICD-10-CM | POA: Diagnosis not present

## 2018-04-26 DIAGNOSIS — M1990 Unspecified osteoarthritis, unspecified site: Secondary | ICD-10-CM | POA: Diagnosis not present

## 2018-04-27 DIAGNOSIS — M1990 Unspecified osteoarthritis, unspecified site: Secondary | ICD-10-CM | POA: Diagnosis not present

## 2018-04-28 ENCOUNTER — Ambulatory Visit (INDEPENDENT_AMBULATORY_CARE_PROVIDER_SITE_OTHER): Payer: Medicare HMO | Admitting: Internal Medicine

## 2018-04-28 ENCOUNTER — Encounter: Payer: Self-pay | Admitting: Internal Medicine

## 2018-04-28 VITALS — BP 104/87 | HR 98 | Temp 98.2°F | Wt 186.6 lb

## 2018-04-28 DIAGNOSIS — G250 Essential tremor: Secondary | ICD-10-CM

## 2018-04-28 DIAGNOSIS — S8001XD Contusion of right knee, subsequent encounter: Secondary | ICD-10-CM | POA: Diagnosis not present

## 2018-04-28 DIAGNOSIS — Z7984 Long term (current) use of oral hypoglycemic drugs: Secondary | ICD-10-CM | POA: Diagnosis not present

## 2018-04-28 DIAGNOSIS — Z79899 Other long term (current) drug therapy: Secondary | ICD-10-CM

## 2018-04-28 DIAGNOSIS — H35039 Hypertensive retinopathy, unspecified eye: Secondary | ICD-10-CM | POA: Diagnosis not present

## 2018-04-28 DIAGNOSIS — I10 Essential (primary) hypertension: Secondary | ICD-10-CM | POA: Diagnosis not present

## 2018-04-28 DIAGNOSIS — W19XXXD Unspecified fall, subsequent encounter: Secondary | ICD-10-CM | POA: Diagnosis not present

## 2018-04-28 DIAGNOSIS — E119 Type 2 diabetes mellitus without complications: Secondary | ICD-10-CM

## 2018-04-28 DIAGNOSIS — M5417 Radiculopathy, lumbosacral region: Secondary | ICD-10-CM

## 2018-04-28 DIAGNOSIS — H35033 Hypertensive retinopathy, bilateral: Secondary | ICD-10-CM

## 2018-04-28 DIAGNOSIS — M1990 Unspecified osteoarthritis, unspecified site: Secondary | ICD-10-CM | POA: Diagnosis not present

## 2018-04-28 LAB — POCT GLYCOSYLATED HEMOGLOBIN (HGB A1C): HbA1c POC (<> result, manual entry): >14 % — AB (ref 4.0–5.6)

## 2018-04-28 LAB — GLUCOSE, CAPILLARY: Glucose-Capillary: 423 mg/dL — ABNORMAL HIGH (ref 70–99)

## 2018-04-28 MED ORDER — DULAGLUTIDE 1.5 MG/0.5ML ~~LOC~~ SOAJ: 1.5000 mg | SUBCUTANEOUS | 11 refills | Status: AC

## 2018-04-28 NOTE — Patient Instructions (Signed)
Mr. Mazariego - -  It is very important that you check your blood sugars and bring in your blood sugar log.   Please take the following medications every day - -  Glipizide 20mg  in the morning and 10mg  at night Metformin 1000mg  in the morning and at night Trulicity 1.5mg /0.70mL every Thursday  Please check your blood sugars once in the morning and one other time during the day.   Please bring in your blood sugar log at next visit.   You should also make an eye doctor appointment as soon as possible.   Thank you!

## 2018-04-28 NOTE — Progress Notes (Signed)
   Subjective:    Patient ID: [name], male    DOB: 1950/12/19, 67 y.o.   MRN: [id number]  CC: 1 month follow up for DM2  HPI  Mr. Mcquiston is a 67yo  Man with PMH of HTN, DM2, essential tremor who returns for 1 month follow up of his DM2.  He has had medication changes in the last 6 weeks since I saw him including increased Trulicity, restarting glipizide and change to metformin alone instead of Janumet.  Today he reports that he is taking his trulicity on Thursday mornings, metformin twice a day and glipizide 2 tablets in the AM and 1 tablet in the afternoon.  He has been having 190s at home.  He had 423 here.  It is a little early for an A1C, but I will check it today to see if any progress is being made.    He had a bad fall with a knee contusion a few weeks ago.  This is improving but slowly.  He is icing his knee and using tramadol given to him in the ED.  In the ED, his renal function was worse than baseline, will recheck today.  He states he is not able to give a urine sample.    Review of Systems  Constitutional: Negative for activity change and unexpected weight change.  Eyes: Negative for photophobia and visual disturbance.  Respiratory: Negative for cough and shortness of breath.   Cardiovascular: Negative for chest pain.  Endocrine: Negative for polydipsia and polyuria.  Musculoskeletal: Positive for arthralgias, back pain and joint swelling (knee from fall).  Neurological: Negative for dizziness, weakness and headaches.       Objective:   Physical Exam  Constitutional: He is oriented to person, place, and time. He appears well-developed and well-nourished.  HENT:  Head: Normocephalic and atraumatic.  Cardiovascular: Normal rate and regular rhythm.  Pulmonary/Chest: Effort normal. No respiratory distress.  Abdominal: Soft. Bowel sounds are normal.  Musculoskeletal:  Swelling of the right knee which is mild, some mild TTP over joint line medial and lateral on  the right knee.  No erythema or deformity.    Neurological: He is alert and oriented to person, place, and time.  Psychiatric: He has a normal mood and affect. His behavior is normal.  Vitals reviewed.   BMET today.   MAU/Cr at next visit.       Assessment & Plan:  RTC in 6 weeks.   I may check him for memory issues at next visit.  I do not have a good understanding of why his BS are not improving as expected on triple therapy.  Problems could be confusion, cost of medications, medical literacy and others.  I have asked him to bring in his BS monitor at next visit.

## 2018-04-29 ENCOUNTER — Other Ambulatory Visit: Payer: Self-pay | Admitting: Internal Medicine

## 2018-04-29 DIAGNOSIS — J449 Chronic obstructive pulmonary disease, unspecified: Secondary | ICD-10-CM

## 2018-04-29 DIAGNOSIS — M1990 Unspecified osteoarthritis, unspecified site: Secondary | ICD-10-CM | POA: Diagnosis not present

## 2018-04-29 LAB — BMP8+ANION GAP
ANION GAP: 19 mmol/L — AB (ref 10.0–18.0)
BUN/Creatinine Ratio: 14 (ref 10–24)
BUN: 19 mg/dL (ref 8–27)
CALCIUM: 9.5 mg/dL (ref 8.6–10.2)
CO2: 19 mmol/L — ABNORMAL LOW (ref 20–29)
Chloride: 97 mmol/L (ref 96–106)
Creatinine, Ser: 1.32 mg/dL — ABNORMAL HIGH (ref 0.76–1.27)
GFR, EST AFRICAN AMERICAN: 64 mL/min/{1.73_m2} (ref >59)
GFR, EST NON AFRICAN AMERICAN: 55 mL/min/{1.73_m2} — AB (ref >59)
Glucose: 457 mg/dL — ABNORMAL HIGH (ref 65–99)
Potassium: 4.2 mmol/L (ref 3.5–5.2)
Sodium: 135 mmol/L (ref 134–144)

## 2018-04-29 NOTE — Assessment & Plan Note (Signed)
He reports that this is slightly worse since falling on his knee, but improvement.  We discussed ice, wrapping and tramadol for the knee pain.

## 2018-04-29 NOTE — Assessment & Plan Note (Signed)
Still very uncontrolled, Hgb A1C of > 14.  I am not sure that he is actually taking his Trulicity.  He had no memory of being told to increase the dose.  He needed to be reminded that he was taking glipizide, but then he could recite the dose.  He never brings in his blood sugar log.  I reminded him that he needed to start taking these medications regularly and I refilled his Trulicity at 1.5 mg weekly.  He was not able to give a urine sample today.  BMET will be done today to check renal function.  He reports his BS are in the 190s at home.  I asked him to bring in his BS log at next visit.

## 2018-04-29 NOTE — Assessment & Plan Note (Signed)
BP is very well controlled today at 104/87.  He reports compliance with diltiazem, hctz, propranolol.    Plan Continue current medications BMET today for renal function.

## 2018-04-29 NOTE — Assessment & Plan Note (Signed)
I reminded him that he needs to be seeing an eye doctor yearly.  He reports seeing someone this year, and I asked him to have them send notes to me.

## 2018-04-30 DIAGNOSIS — M1990 Unspecified osteoarthritis, unspecified site: Secondary | ICD-10-CM | POA: Diagnosis not present

## 2018-05-01 DIAGNOSIS — M1990 Unspecified osteoarthritis, unspecified site: Secondary | ICD-10-CM | POA: Diagnosis not present

## 2018-05-02 DIAGNOSIS — M1990 Unspecified osteoarthritis, unspecified site: Secondary | ICD-10-CM | POA: Diagnosis not present

## 2018-05-03 ENCOUNTER — Telehealth: Payer: Self-pay | Admitting: *Deleted

## 2018-05-03 ENCOUNTER — Other Ambulatory Visit: Payer: Self-pay | Admitting: Internal Medicine

## 2018-05-03 DIAGNOSIS — E119 Type 2 diabetes mellitus without complications: Secondary | ICD-10-CM

## 2018-05-03 DIAGNOSIS — M1990 Unspecified osteoarthritis, unspecified site: Secondary | ICD-10-CM | POA: Diagnosis not present

## 2018-05-03 NOTE — Telephone Encounter (Signed)
Thank you :)

## 2018-05-03 NOTE — Telephone Encounter (Signed)
-----   Message from Sid Falcon, MD sent at 05/03/2018  3:05 PM EST ----- Can you call Mr. Mcnay and have him come in for a lab visit to follow up on some abnormalities?   Need to do a repeat BMET.   Thank you!  Will place the order.  ----- Message ----- From: Buel Ream, [facility] [place] Sent: 04/28/2018   9:57 AM EST To: Sid Falcon, MD

## 2018-05-03 NOTE — Progress Notes (Signed)
[facility] - - Would like Mr. Gorelik to come in for BMET, just FYI.  Holley Raring will call patient.

## 2018-05-03 NOTE — Telephone Encounter (Signed)
Pt called and lab appt scheduled for tomorrow 11/12 @ 1100 AM.

## 2018-05-04 ENCOUNTER — Other Ambulatory Visit: Payer: Medicare HMO

## 2018-05-04 DIAGNOSIS — M1990 Unspecified osteoarthritis, unspecified site: Secondary | ICD-10-CM | POA: Diagnosis not present

## 2018-05-05 ENCOUNTER — Other Ambulatory Visit (INDEPENDENT_AMBULATORY_CARE_PROVIDER_SITE_OTHER): Payer: Medicare HMO

## 2018-05-05 DIAGNOSIS — M1990 Unspecified osteoarthritis, unspecified site: Secondary | ICD-10-CM | POA: Diagnosis not present

## 2018-05-05 DIAGNOSIS — E119 Type 2 diabetes mellitus without complications: Secondary | ICD-10-CM

## 2018-05-06 DIAGNOSIS — M1990 Unspecified osteoarthritis, unspecified site: Secondary | ICD-10-CM | POA: Diagnosis not present

## 2018-05-06 LAB — BMP8+ANION GAP
ANION GAP: 18 mmol/L (ref 10.0–18.0)
BUN/Creatinine Ratio: 18 (ref 10–24)
BUN: 25 mg/dL (ref 8–27)
CHLORIDE: 95 mmol/L — AB (ref 96–106)
CO2: 20 mmol/L (ref 20–29)
Calcium: 9.6 mg/dL (ref 8.6–10.2)
Creatinine, Ser: 1.4 mg/dL — ABNORMAL HIGH (ref 0.76–1.27)
GFR, EST AFRICAN AMERICAN: 59 mL/min/{1.73_m2} — AB (ref >59)
GFR, EST NON AFRICAN AMERICAN: 51 mL/min/{1.73_m2} — AB (ref >59)
GLUCOSE: 454 mg/dL — AB (ref 65–99)
POTASSIUM: 4.6 mmol/L (ref 3.5–5.2)
Sodium: 133 mmol/L — ABNORMAL LOW (ref 134–144)

## 2018-05-07 DIAGNOSIS — M1990 Unspecified osteoarthritis, unspecified site: Secondary | ICD-10-CM | POA: Diagnosis not present

## 2018-05-08 DIAGNOSIS — M1990 Unspecified osteoarthritis, unspecified site: Secondary | ICD-10-CM | POA: Diagnosis not present

## 2018-05-09 DIAGNOSIS — M1990 Unspecified osteoarthritis, unspecified site: Secondary | ICD-10-CM | POA: Diagnosis not present

## 2018-05-10 DIAGNOSIS — M1990 Unspecified osteoarthritis, unspecified site: Secondary | ICD-10-CM | POA: Diagnosis not present

## 2018-05-11 DIAGNOSIS — M1990 Unspecified osteoarthritis, unspecified site: Secondary | ICD-10-CM | POA: Diagnosis not present

## 2018-05-12 DIAGNOSIS — M1990 Unspecified osteoarthritis, unspecified site: Secondary | ICD-10-CM | POA: Diagnosis not present

## 2018-05-13 DIAGNOSIS — M1990 Unspecified osteoarthritis, unspecified site: Secondary | ICD-10-CM | POA: Diagnosis not present

## 2018-05-14 DIAGNOSIS — M1990 Unspecified osteoarthritis, unspecified site: Secondary | ICD-10-CM | POA: Diagnosis not present

## 2018-05-15 DIAGNOSIS — M1990 Unspecified osteoarthritis, unspecified site: Secondary | ICD-10-CM | POA: Diagnosis not present

## 2018-05-16 DIAGNOSIS — M1990 Unspecified osteoarthritis, unspecified site: Secondary | ICD-10-CM | POA: Diagnosis not present

## 2018-05-17 DIAGNOSIS — M1990 Unspecified osteoarthritis, unspecified site: Secondary | ICD-10-CM | POA: Diagnosis not present

## 2018-05-18 DIAGNOSIS — M1990 Unspecified osteoarthritis, unspecified site: Secondary | ICD-10-CM | POA: Diagnosis not present

## 2018-05-19 DIAGNOSIS — M1990 Unspecified osteoarthritis, unspecified site: Secondary | ICD-10-CM | POA: Diagnosis not present

## 2018-05-20 DIAGNOSIS — M1990 Unspecified osteoarthritis, unspecified site: Secondary | ICD-10-CM | POA: Diagnosis not present

## 2018-05-21 DIAGNOSIS — M1990 Unspecified osteoarthritis, unspecified site: Secondary | ICD-10-CM | POA: Diagnosis not present

## 2018-05-22 DIAGNOSIS — M1990 Unspecified osteoarthritis, unspecified site: Secondary | ICD-10-CM | POA: Diagnosis not present

## 2018-05-23 DIAGNOSIS — M1990 Unspecified osteoarthritis, unspecified site: Secondary | ICD-10-CM | POA: Diagnosis not present

## 2018-05-28 ENCOUNTER — Other Ambulatory Visit: Payer: Self-pay

## 2018-05-28 DIAGNOSIS — M170 Bilateral primary osteoarthritis of knee: Secondary | ICD-10-CM

## 2018-05-28 MED ORDER — HYDROCODONE-ACETAMINOPHEN 7.5-325 MG PO TABS: 1.0000 | ORAL_TABLET | Freq: Four times a day (QID) | ORAL | 0 refills | Status: AC | PRN

## 2018-05-28 NOTE — Telephone Encounter (Signed)
HYDROcodone-acetaminophen (NORCO) 7.5-325 MG tablet  Refill request @  [id number] [address], [place] [phone] (Phone) [phone] (Fax)

## 2018-05-28 NOTE — Telephone Encounter (Signed)
Will fill X 3 months.  He has received an Rx from ED after fall.  I will remind him at next visit of pain contract rules and likely sign a new pain contract.

## 2018-05-31 DIAGNOSIS — M1990 Unspecified osteoarthritis, unspecified site: Secondary | ICD-10-CM | POA: Diagnosis not present

## 2018-06-01 DIAGNOSIS — M1990 Unspecified osteoarthritis, unspecified site: Secondary | ICD-10-CM | POA: Diagnosis not present

## 2018-06-02 DIAGNOSIS — M1990 Unspecified osteoarthritis, unspecified site: Secondary | ICD-10-CM | POA: Diagnosis not present

## 2018-06-03 DIAGNOSIS — M1990 Unspecified osteoarthritis, unspecified site: Secondary | ICD-10-CM | POA: Diagnosis not present

## 2018-06-04 DIAGNOSIS — M1990 Unspecified osteoarthritis, unspecified site: Secondary | ICD-10-CM | POA: Diagnosis not present

## 2018-06-05 DIAGNOSIS — M1990 Unspecified osteoarthritis, unspecified site: Secondary | ICD-10-CM | POA: Diagnosis not present

## 2018-06-06 DIAGNOSIS — M1990 Unspecified osteoarthritis, unspecified site: Secondary | ICD-10-CM | POA: Diagnosis not present

## 2018-06-07 DIAGNOSIS — M1990 Unspecified osteoarthritis, unspecified site: Secondary | ICD-10-CM | POA: Diagnosis not present

## 2018-06-08 ENCOUNTER — Other Ambulatory Visit: Payer: Self-pay | Admitting: Internal Medicine

## 2018-06-08 DIAGNOSIS — M1990 Unspecified osteoarthritis, unspecified site: Secondary | ICD-10-CM | POA: Diagnosis not present

## 2018-06-08 DIAGNOSIS — E119 Type 2 diabetes mellitus without complications: Secondary | ICD-10-CM

## 2018-06-09 DIAGNOSIS — M1990 Unspecified osteoarthritis, unspecified site: Secondary | ICD-10-CM | POA: Diagnosis not present

## 2018-06-10 DIAGNOSIS — M1990 Unspecified osteoarthritis, unspecified site: Secondary | ICD-10-CM | POA: Diagnosis not present

## 2018-06-11 DIAGNOSIS — M1990 Unspecified osteoarthritis, unspecified site: Secondary | ICD-10-CM | POA: Diagnosis not present

## 2018-06-12 DIAGNOSIS — M1990 Unspecified osteoarthritis, unspecified site: Secondary | ICD-10-CM | POA: Diagnosis not present

## 2018-06-13 DIAGNOSIS — M1990 Unspecified osteoarthritis, unspecified site: Secondary | ICD-10-CM | POA: Diagnosis not present

## 2018-06-14 DIAGNOSIS — M1990 Unspecified osteoarthritis, unspecified site: Secondary | ICD-10-CM | POA: Diagnosis not present

## 2018-06-15 DIAGNOSIS — M1990 Unspecified osteoarthritis, unspecified site: Secondary | ICD-10-CM | POA: Diagnosis not present

## 2018-06-16 DIAGNOSIS — M1990 Unspecified osteoarthritis, unspecified site: Secondary | ICD-10-CM | POA: Diagnosis not present

## 2018-06-17 DIAGNOSIS — M1990 Unspecified osteoarthritis, unspecified site: Secondary | ICD-10-CM | POA: Diagnosis not present

## 2018-06-18 DIAGNOSIS — M1990 Unspecified osteoarthritis, unspecified site: Secondary | ICD-10-CM | POA: Diagnosis not present

## 2018-06-19 DIAGNOSIS — M1990 Unspecified osteoarthritis, unspecified site: Secondary | ICD-10-CM | POA: Diagnosis not present

## 2018-06-20 DIAGNOSIS — M1990 Unspecified osteoarthritis, unspecified site: Secondary | ICD-10-CM | POA: Diagnosis not present

## 2018-06-21 DIAGNOSIS — M1990 Unspecified osteoarthritis, unspecified site: Secondary | ICD-10-CM | POA: Diagnosis not present

## 2018-06-22 DIAGNOSIS — M1990 Unspecified osteoarthritis, unspecified site: Secondary | ICD-10-CM | POA: Diagnosis not present

## 2018-06-23 DIAGNOSIS — M1990 Unspecified osteoarthritis, unspecified site: Secondary | ICD-10-CM | POA: Diagnosis not present

## 2018-06-24 DIAGNOSIS — M1990 Unspecified osteoarthritis, unspecified site: Secondary | ICD-10-CM | POA: Diagnosis not present

## 2018-06-25 DIAGNOSIS — M1990 Unspecified osteoarthritis, unspecified site: Secondary | ICD-10-CM | POA: Diagnosis not present

## 2018-06-26 DIAGNOSIS — M1990 Unspecified osteoarthritis, unspecified site: Secondary | ICD-10-CM | POA: Diagnosis not present

## 2018-06-27 DIAGNOSIS — M1990 Unspecified osteoarthritis, unspecified site: Secondary | ICD-10-CM | POA: Diagnosis not present

## 2018-06-28 DIAGNOSIS — M1990 Unspecified osteoarthritis, unspecified site: Secondary | ICD-10-CM | POA: Diagnosis not present

## 2018-06-29 DIAGNOSIS — M1990 Unspecified osteoarthritis, unspecified site: Secondary | ICD-10-CM | POA: Diagnosis not present

## 2018-06-30 DIAGNOSIS — M1990 Unspecified osteoarthritis, unspecified site: Secondary | ICD-10-CM | POA: Diagnosis not present

## 2018-07-01 DIAGNOSIS — M1990 Unspecified osteoarthritis, unspecified site: Secondary | ICD-10-CM | POA: Diagnosis not present

## 2018-07-02 DIAGNOSIS — M1990 Unspecified osteoarthritis, unspecified site: Secondary | ICD-10-CM | POA: Diagnosis not present

## 2018-07-03 DIAGNOSIS — M1990 Unspecified osteoarthritis, unspecified site: Secondary | ICD-10-CM | POA: Diagnosis not present

## 2018-07-04 DIAGNOSIS — M1990 Unspecified osteoarthritis, unspecified site: Secondary | ICD-10-CM | POA: Diagnosis not present

## 2018-07-05 DIAGNOSIS — M1990 Unspecified osteoarthritis, unspecified site: Secondary | ICD-10-CM | POA: Diagnosis not present

## 2018-07-06 DIAGNOSIS — M1990 Unspecified osteoarthritis, unspecified site: Secondary | ICD-10-CM | POA: Diagnosis not present

## 2018-07-07 DIAGNOSIS — M1990 Unspecified osteoarthritis, unspecified site: Secondary | ICD-10-CM | POA: Diagnosis not present

## 2018-07-08 DIAGNOSIS — M1990 Unspecified osteoarthritis, unspecified site: Secondary | ICD-10-CM | POA: Diagnosis not present

## 2018-07-09 ENCOUNTER — Other Ambulatory Visit: Payer: Self-pay | Admitting: Internal Medicine

## 2018-07-09 DIAGNOSIS — J449 Chronic obstructive pulmonary disease, unspecified: Secondary | ICD-10-CM

## 2018-07-09 DIAGNOSIS — M1990 Unspecified osteoarthritis, unspecified site: Secondary | ICD-10-CM | POA: Diagnosis not present

## 2018-07-10 DIAGNOSIS — M1990 Unspecified osteoarthritis, unspecified site: Secondary | ICD-10-CM | POA: Diagnosis not present

## 2018-07-11 DIAGNOSIS — M1990 Unspecified osteoarthritis, unspecified site: Secondary | ICD-10-CM | POA: Diagnosis not present

## 2018-07-19 ENCOUNTER — Other Ambulatory Visit: Payer: Self-pay | Admitting: Internal Medicine

## 2018-07-19 DIAGNOSIS — M1990 Unspecified osteoarthritis, unspecified site: Secondary | ICD-10-CM | POA: Diagnosis not present

## 2018-07-19 DIAGNOSIS — I10 Essential (primary) hypertension: Secondary | ICD-10-CM

## 2018-07-20 DIAGNOSIS — M1990 Unspecified osteoarthritis, unspecified site: Secondary | ICD-10-CM | POA: Diagnosis not present

## 2018-07-21 ENCOUNTER — Encounter: Payer: Self-pay | Admitting: Internal Medicine

## 2018-07-21 ENCOUNTER — Other Ambulatory Visit: Payer: Self-pay | Admitting: *Deleted

## 2018-07-21 ENCOUNTER — Ambulatory Visit (INDEPENDENT_AMBULATORY_CARE_PROVIDER_SITE_OTHER): Payer: Medicare HMO | Admitting: Internal Medicine

## 2018-07-21 ENCOUNTER — Encounter (INDEPENDENT_AMBULATORY_CARE_PROVIDER_SITE_OTHER): Payer: Self-pay

## 2018-07-21 ENCOUNTER — Other Ambulatory Visit: Payer: Self-pay

## 2018-07-21 VITALS — BP 116/67 | HR 88 | Temp 97.5°F | Ht 73.0 in | Wt 180.5 lb

## 2018-07-21 DIAGNOSIS — R413 Other amnesia: Secondary | ICD-10-CM

## 2018-07-21 DIAGNOSIS — I10 Essential (primary) hypertension: Secondary | ICD-10-CM

## 2018-07-21 DIAGNOSIS — E1165 Type 2 diabetes mellitus with hyperglycemia: Secondary | ICD-10-CM | POA: Diagnosis not present

## 2018-07-21 DIAGNOSIS — Z79899 Other long term (current) drug therapy: Secondary | ICD-10-CM

## 2018-07-21 DIAGNOSIS — J449 Chronic obstructive pulmonary disease, unspecified: Secondary | ICD-10-CM

## 2018-07-21 DIAGNOSIS — E118 Type 2 diabetes mellitus with unspecified complications: Secondary | ICD-10-CM | POA: Diagnosis not present

## 2018-07-21 DIAGNOSIS — M1990 Unspecified osteoarthritis, unspecified site: Secondary | ICD-10-CM | POA: Diagnosis not present

## 2018-07-21 LAB — POCT GLYCOSYLATED HEMOGLOBIN (HGB A1C): HbA1c POC (<> result, manual entry): >14 % — AB (ref 4.0–5.6)

## 2018-07-21 LAB — GLUCOSE, CAPILLARY: GLUCOSE-CAPILLARY: 488 mg/dL — AB (ref 70–99)

## 2018-07-21 NOTE — Assessment & Plan Note (Signed)
Well controlled on spiriva and albuterol.  He reports no recent exacerbations, no SOB or chest pain today  Continue spiriva and prn albuterol

## 2018-07-21 NOTE — Assessment & Plan Note (Addendum)
[name] has had uncontrolled DM now for 3 years.  He has been on multiple medications and seen multiple different providers including a diabetes educator and pharmacist along with myself.  He reports taking his medications appropriately, however, I was concerned about memory issues and his MOCA is lower than expected.  I am concerned for a portion of dementia causing it to be difficult for him to remember the goals he has for his DM or manage his medications.  Another possibility would be LADA and we should assess for antibiotics at next visit, which would make insulin necessary.  He is very opposed to insulin, as he was on it before and did not like it.    Plan I gave him information again about diet and lifestyle changes. He is adamant that he can do it himself.    I will consider checking auto-antibodies at next visit.    Will need to repeat MOCA at next visit, using 7.1 version.    He has an appointment for an eye exam next week.    MAU/Cr and BMET today.  I think he is developing CKD due to his uncontrolled DM.  He is on diltiazem.  I do not see an allergy to lisinopril listed, but will need to check through his history to see if this needs to be changed.

## 2018-07-21 NOTE — Assessment & Plan Note (Signed)
BP is well controlled, he is taking his BP medication as reported.  He is currently on hydrochlorothiazide and propranolol.   Plan BMET today Continue HCTZ and propranolol

## 2018-07-21 NOTE — Progress Notes (Signed)
Subjective:    Patient ID: [name], male    DOB: [birth year], 68 y.o.   MRN: [id number]  CC: 2 month follow up for Diabetes  HPI   Mr. Hoar is a 68 year old man with PMH of DM2, HTN, COPD who presents for follow up.    Mr. Joslin has had an uncontrolled A1C, mostly > 14, for about 3 years now. We have tried multiple medications including invokana, januvia.  Most recently he is on Trulicity once a week, glipizide and metformin.  He was previously well controlled.  He refuses to go back on invokana and will not consider insulin.  We had a frank discussion about his diabetes today. I reminded him that his DM has been uncontrolled for about 3 years.  He seemed surprised by this, though we have been discussing this for a long period of time.  He has seen MNT and Pharmacy.  He has had CGM monitoring.  He is able to name his medications and tell me when he is taking them.  He reports BS measurements, but never remembers to bring in his meter.  He seemed surprised that pasta and rice are foods that can raise his blood sugar, despite meeting with a nutritionist.  I have been concerned about his memory.  Today, I screened him for memory issues with a MOCA 7.3 and he scored 21/30 (+ 1 point for only getting through the 11th grade of school.)  His main deficits were in executive function and attention and delayed recall.  He lives alone, manages his own affairs and medications.  He has an MRI from last summer showing chronic microvascular changes.  I wonder if he has some amount of vascular dementia.  I feel like we have had the same conversation about his diabetes every 3 months for 3 years now.  I would like to enlist the help of his daughter, however, I will need to ask his consent for this.  He did not give consent for this today.  He should likely have a family member with him during visits going forward.  I will need to repeat the MOCA 7.1 and possibly a test for low literacy at next visit.   He reports commitment to change in lifestyle, exercising and decreasing carbs and sweets, but I am concerned he will not be able to do this.    His blood pressure and COPD are well controlled.  He does appear to be remembering to take his medications.    Review of Systems  Constitutional: Positive for activity change. Negative for appetite change and unexpected weight change.  Respiratory: Negative for cough and shortness of breath.   Cardiovascular: Negative for chest pain.  Endocrine: Positive for polydipsia and polyuria.  Genitourinary: Negative for difficulty urinating.  Musculoskeletal: Positive for arthralgias and back pain.  Neurological: Negative for dizziness and weakness.       Objective:   Physical Exam Constitutional:      General: He is not in acute distress.    Appearance: He is not toxic-appearing.     Comments: Appears fatigued  HENT:     Head: Normocephalic and atraumatic.  Cardiovascular:     Pulses: Normal pulses.  Pulmonary:     Effort: Pulmonary effort is normal. No respiratory distress.  Neurological:     Mental Status: He is alert.     Comments: Moca 21/30  Psychiatric:        Mood and Affect: Mood normal.  Behavior: Behavior normal.    BMET, MAU/Cr today       Assessment & Plan:  RTC in 3 months  Memory deficits Moca today was 21/30 H/O chronic microvascular changes on MRI - Possibility of vascular dementia.  Repeat MOCA at next visit.

## 2018-07-21 NOTE — Patient Instructions (Signed)
[name] - -  It is very important that you start making changes to your diet.  Your diabetes has been out of control for 2.5 years now.  You are at risk for kidney failure, blindness and foot wounds.    Please try to limit eating carbohydrates like pasta, rice, starchy vegetables and sweets.  Below is information about nutrition and lifestyle changes.    Diabetes Mellitus and Nutrition, Adult When you have diabetes (diabetes mellitus), it is very important to have healthy eating habits because your blood sugar (glucose) levels are greatly affected by what you eat and drink. Eating healthy foods in the appropriate amounts, at about the same times every day, can help you:  Control your blood glucose.  Lower your risk of heart disease.  Improve your blood pressure.  Reach or maintain a healthy weight. Every person with diabetes is different, and each person has different needs for a meal plan. Your health care provider may recommend that you work with a diet and nutrition specialist (dietitian) to make a meal plan that is best for you. Your meal plan may vary depending on factors such as:  The calories you need.  The medicines you take.  Your weight.  Your blood glucose, blood pressure, and cholesterol levels.  Your activity level.  Other health conditions you have, such as heart or kidney disease. How do carbohydrates affect me? Carbohydrates, also called carbs, affect your blood glucose level more than any other type of food. Eating carbs naturally raises the amount of glucose in your blood. Carb counting is a method for keeping track of how many carbs you eat. Counting carbs is important to keep your blood glucose at a healthy level, especially if you use insulin or take certain oral diabetes medicines. It is important to know how many carbs you can safely have in each meal. This is different for every person. Your dietitian can help you calculate how many carbs you should have  at each meal and for each snack. Foods that contain carbs include:  Bread, cereal, rice, pasta, and crackers.  Potatoes and corn.  Peas, beans, and lentils.  Milk and yogurt.  Fruit and juice.  Desserts, such as cakes, cookies, ice cream, and candy. How does alcohol affect me? Alcohol can cause a sudden decrease in blood glucose (hypoglycemia), especially if you use insulin or take certain oral diabetes medicines. Hypoglycemia can be a life-threatening condition. Symptoms of hypoglycemia (sleepiness, dizziness, and confusion) are similar to symptoms of having too much alcohol. If your health care provider says that alcohol is safe for you, follow these guidelines:  Limit alcohol intake to no more than 1 drink per day for nonpregnant women and 2 drinks per day for men. One drink equals 12 oz of beer, 5 oz of wine, or 1 oz of hard liquor.  Do not drink on an empty stomach.  Keep yourself hydrated with water, diet soda, or unsweetened iced tea.  Keep in mind that regular soda, juice, and other mixers may contain a lot of sugar and must be counted as carbs. What are tips for following this plan?  Reading food labels  Start by checking the serving size on the "Nutrition Facts" label of packaged foods and drinks. The amount of calories, carbs, fats, and other nutrients listed on the label is based on one serving of the item. Many items contain more than one serving per package.  Check the total grams (g) of carbs in one serving. You  can calculate the number of servings of carbs in one serving by dividing the total carbs by 15. For example, if a food has 30 g of total carbs, it would be equal to 2 servings of carbs.  Check the number of grams (g) of saturated and trans fats in one serving. Choose foods that have low or no amount of these fats.  Check the number of milligrams (mg) of salt (sodium) in one serving. Most people should limit total sodium intake to less than 2,300 mg per  day.  Always check the nutrition information of foods labeled as "low-fat" or "nonfat". These foods may be higher in added sugar or refined carbs and should be avoided.  Talk to your dietitian to identify your daily goals for nutrients listed on the label. Shopping  Avoid buying canned, premade, or processed foods. These foods tend to be high in fat, sodium, and added sugar.  Shop around the outside edge of the grocery store. This includes fresh fruits and vegetables, bulk grains, fresh meats, and fresh dairy. Cooking  Use low-heat cooking methods, such as baking, instead of high-heat cooking methods like deep frying.  Cook using healthy oils, such as olive, canola, or sunflower oil.  Avoid cooking with butter, cream, or high-fat meats. Meal planning  Eat meals and snacks regularly, preferably at the same times every day. Avoid going long periods of time without eating.  Eat foods high in fiber, such as fresh fruits, vegetables, beans, and whole grains. Talk to your dietitian about how many servings of carbs you can eat at each meal.  Eat 4-6 ounces (oz) of lean protein each day, such as lean meat, chicken, fish, eggs, or tofu. One oz of lean protein is equal to: ? 1 oz of meat, chicken, or fish. ? 1 egg. ?  cup of tofu.  Eat some foods each day that contain healthy fats, such as avocado, nuts, seeds, and fish. Lifestyle  Check your blood glucose regularly.  Exercise regularly as told by your health care provider. This may include: ? 150 minutes of moderate-intensity or vigorous-intensity exercise each week. This could be brisk walking, biking, or water aerobics. ? Stretching and doing strength exercises, such as yoga or weightlifting, at least 2 times a week.  Take medicines as told by your health care provider.  Do not use any products that contain nicotine or tobacco, such as cigarettes and e-cigarettes. If you need help quitting, ask your health care provider.  Work with  a Social worker or diabetes educator to identify strategies to manage stress and any emotional and social challenges. Questions to ask a health care provider  Do I need to meet with a diabetes educator?  Do I need to meet with a dietitian?  What number can I call if I have questions?  When are the best times to check my blood glucose? Where to find more information:  American Diabetes Association: diabetes.org  Academy of Nutrition and Dietetics: www.eatright.[employer] of Diabetes and Digestive and Kidney Diseases (NIH): [employer] Summary  A healthy meal plan will help you control your blood glucose and maintain a healthy lifestyle.  Working with a diet and nutrition specialist (dietitian) can help you make a meal plan that is best for you.  Keep in mind that carbohydrates (carbs) and alcohol have immediate effects on your blood glucose levels. It is important to count carbs and to use alcohol carefully. This information is not intended to replace advice given to you by  your health care provider. Make sure you discuss any questions you have with your health care provider. Document Released: 03/06/2005 Document Revised: 01/07/2017 Document Reviewed: 07/14/2016 Elsevier Interactive Patient Education  2019 [employer].

## 2018-07-22 LAB — BMP8+ANION GAP
Anion Gap: 21 mmol/L — ABNORMAL HIGH (ref 10.0–18.0)
BUN/Creatinine Ratio: 12 (ref 10–24)
BUN: 16 mg/dL (ref 8–27)
CALCIUM: 9.9 mg/dL (ref 8.6–10.2)
CHLORIDE: 93 mmol/L — AB (ref 96–106)
CO2: 20 mmol/L (ref 20–29)
Creatinine, Ser: 1.35 mg/dL — ABNORMAL HIGH (ref 0.76–1.27)
GFR calc Af Amer: 62 mL/min/{1.73_m2} (ref >59)
GFR calc non Af Amer: 54 mL/min/{1.73_m2} — ABNORMAL LOW (ref >59)
Glucose: 491 mg/dL — ABNORMAL HIGH (ref 65–99)
POTASSIUM: 4.7 mmol/L (ref 3.5–5.2)
Sodium: 134 mmol/L (ref 134–144)

## 2018-07-22 LAB — MICROALBUMIN / CREATININE URINE RATIO
CREATININE, UR: 56.3 mg/dL
MICROALB/CREAT RATIO: 19 mg/g{creat} (ref 0–29)
Microalbumin, Urine: 10.9 ug/mL

## 2018-07-23 DIAGNOSIS — M1990 Unspecified osteoarthritis, unspecified site: Secondary | ICD-10-CM | POA: Diagnosis not present

## 2018-07-24 ENCOUNTER — Other Ambulatory Visit: Payer: Self-pay | Admitting: Internal Medicine

## 2018-07-24 DIAGNOSIS — E119 Type 2 diabetes mellitus without complications: Secondary | ICD-10-CM

## 2018-07-24 DIAGNOSIS — M1990 Unspecified osteoarthritis, unspecified site: Secondary | ICD-10-CM | POA: Diagnosis not present

## 2018-07-25 DIAGNOSIS — I499 Cardiac arrhythmia, unspecified: Secondary | ICD-10-CM | POA: Diagnosis not present

## 2018-07-25 DIAGNOSIS — R404 Transient alteration of awareness: Secondary | ICD-10-CM | POA: Diagnosis not present

## 2018-08-22 DIAGNOSIS — 419620001 Death: Secondary | SNOMED CT | POA: Diagnosis not present

## 2018-08-22 DEATH — deceased

## 2020-04-11 IMAGING — DX DG FOOT COMPLETE 3+V*R*
3 series · 3 of 3 positions shown · non-contrast
Comparison: None.

CLINICAL DATA: Slipped and fell this morning with right foot pain.

EXAM:
RIGHT FOOT COMPLETE - 3+ VIEW

[x foot lat right]
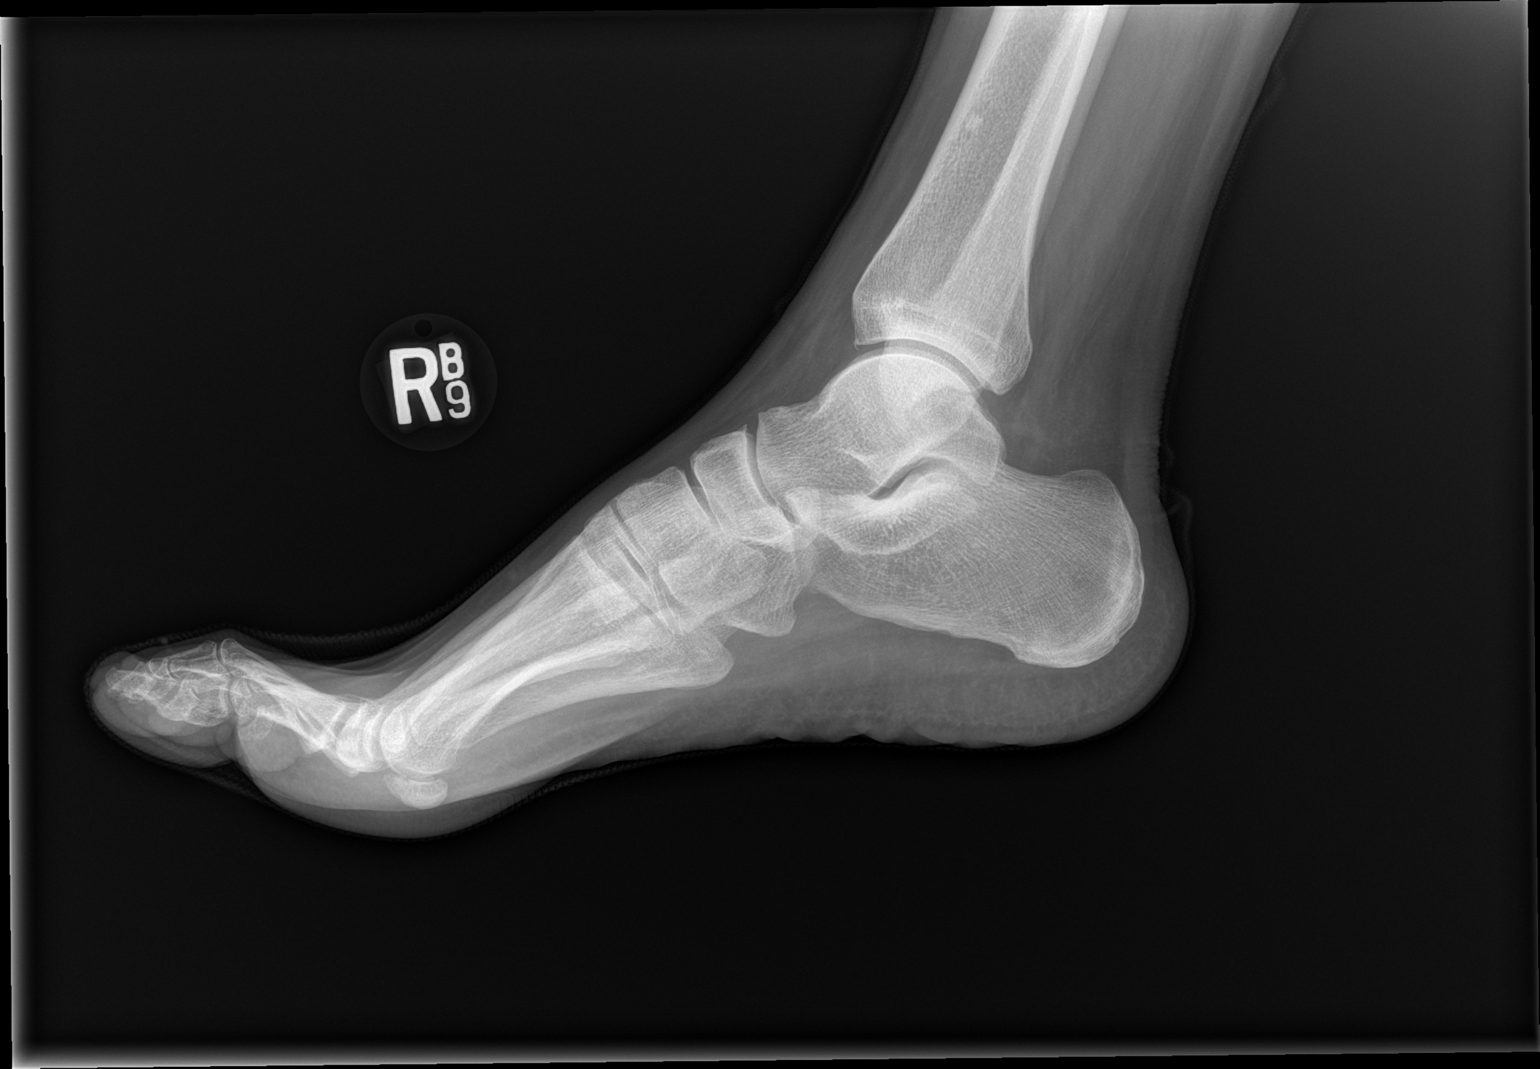

[x foot ap right]
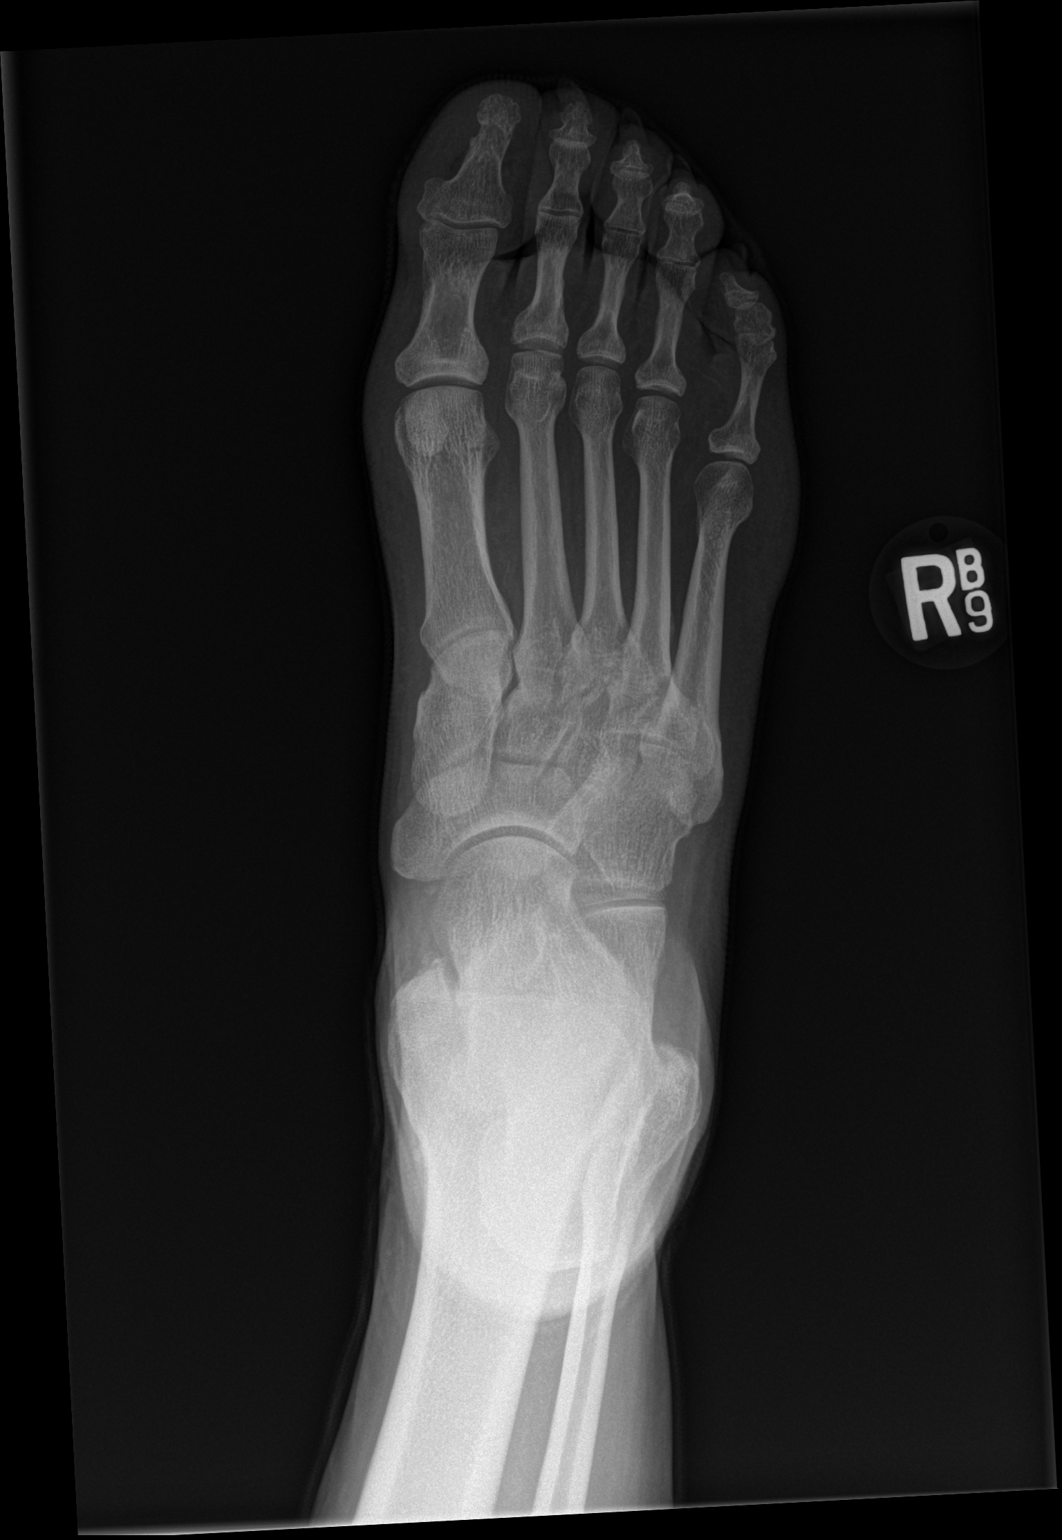

[x foot obl right]
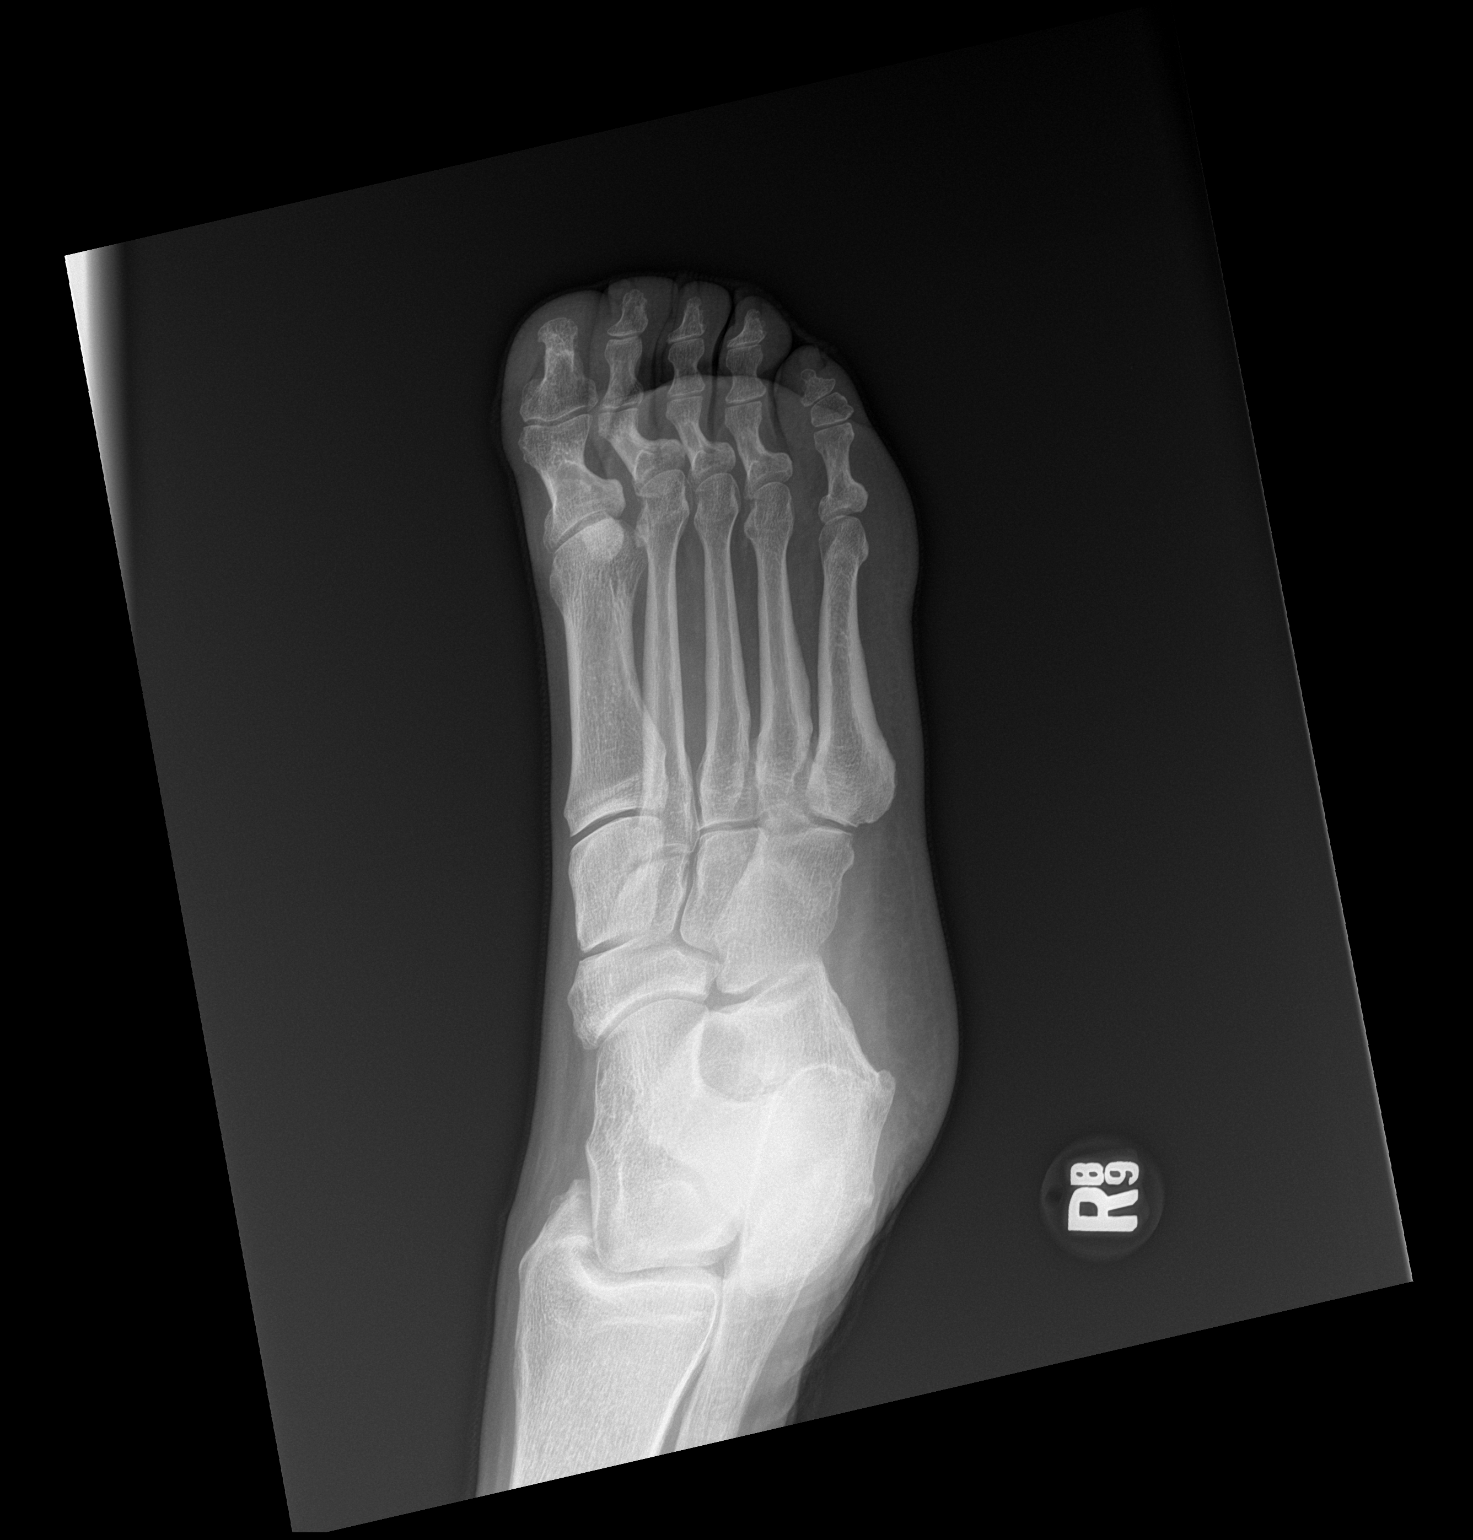

[3 of 3 positions shown; findings below may reference images not displayed]

FINDINGS: There is no evidence of fracture or dislocation. There is no
evidence of arthropathy or other focal bone abnormality. Soft
tissues are unremarkable.
IMPRESSION: Negative.

## 2020-04-11 IMAGING — DX DG KNEE COMPLETE 4+V*R*
5 series · 5 of 5 positions shown · non-contrast
Comparison: None.

CLINICAL DATA: Slipped and fell this morning with right knee pain.

EXAM:
RIGHT KNEE - COMPLETE 4+ VIEW

[t knee ap right]
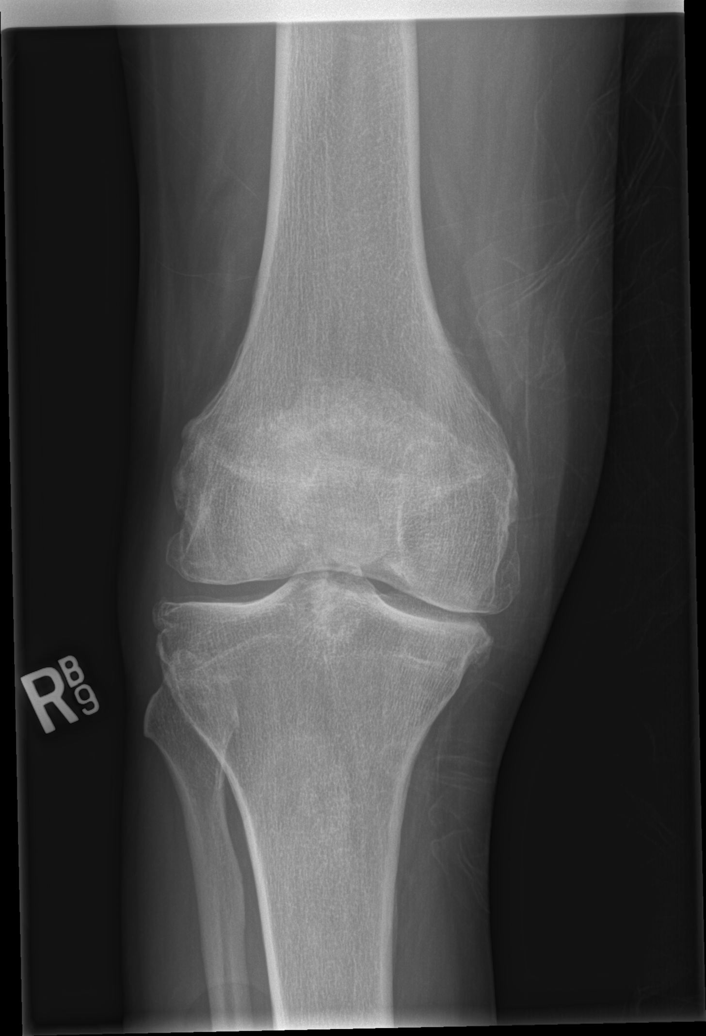

[t knee obl right (1 of 3)]
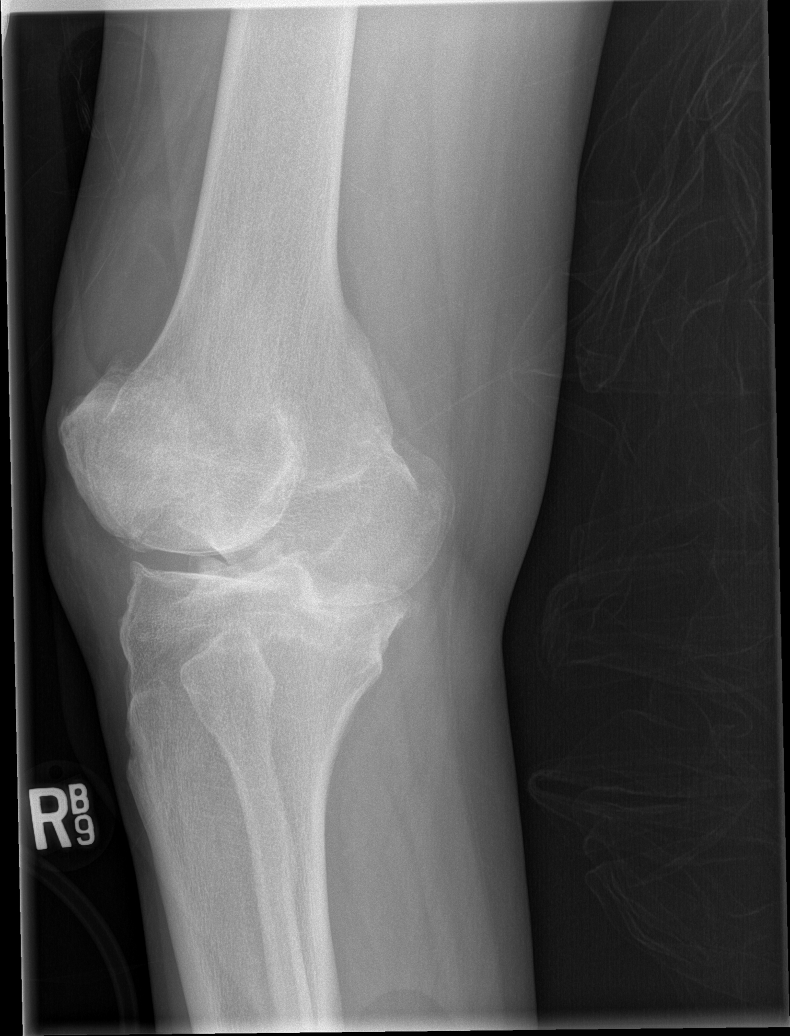

[t knee obl right (2 of 3)]
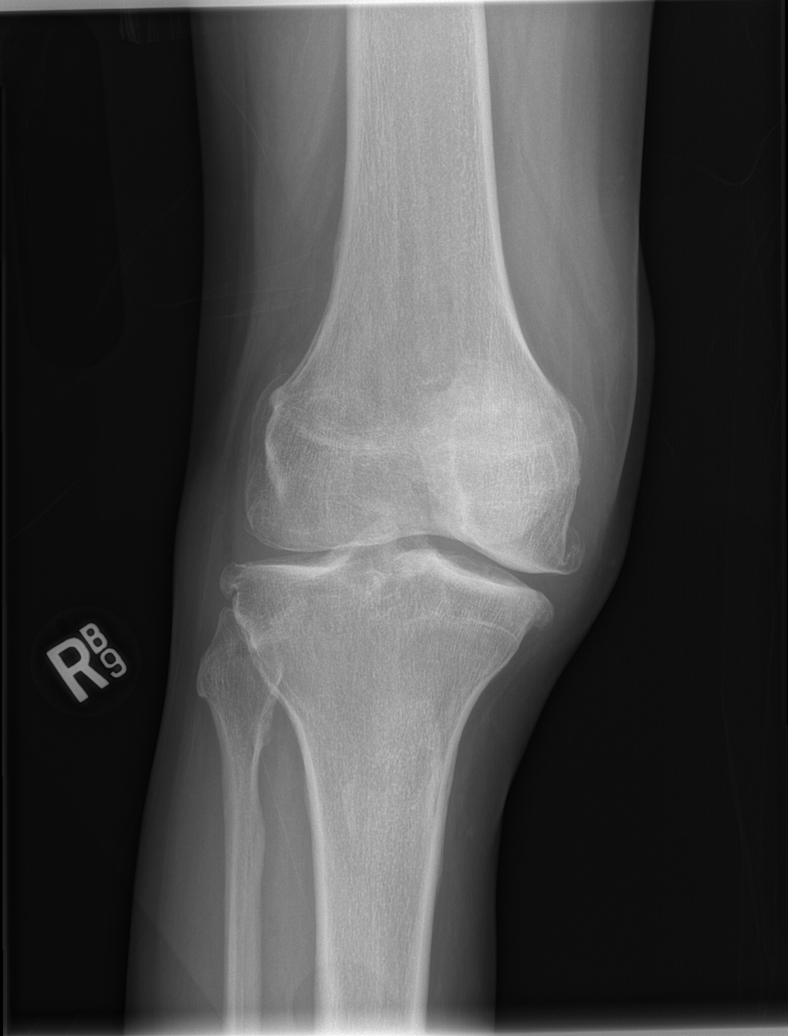

[x knee lat right]
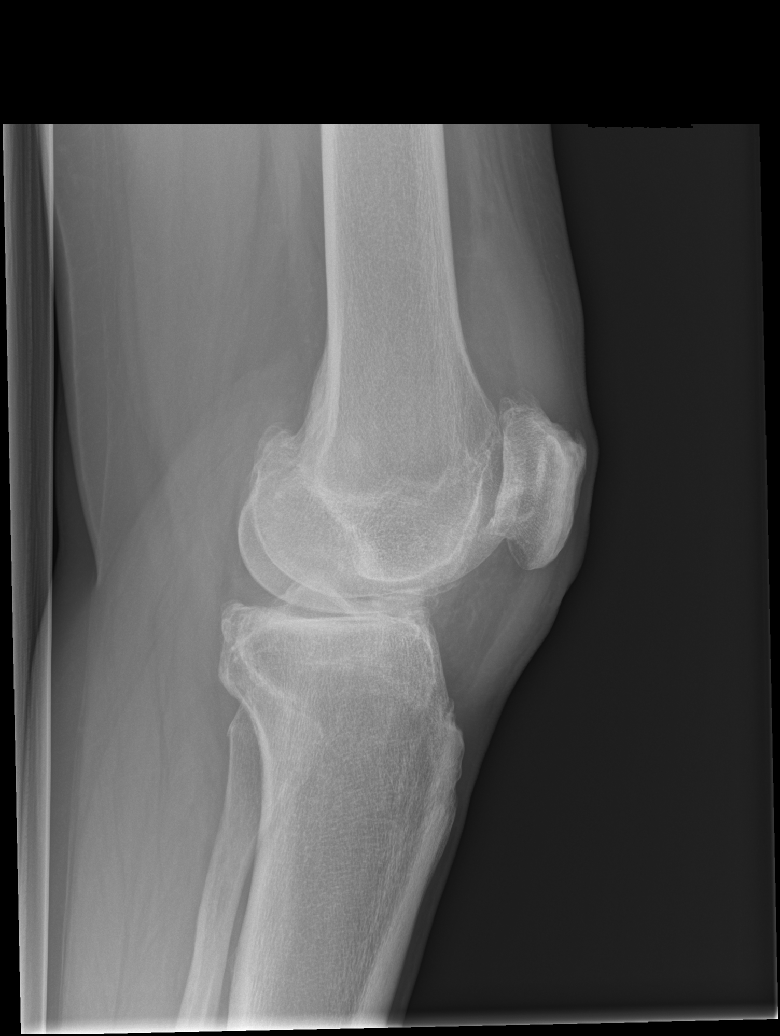

[t knee obl right (3 of 3)]
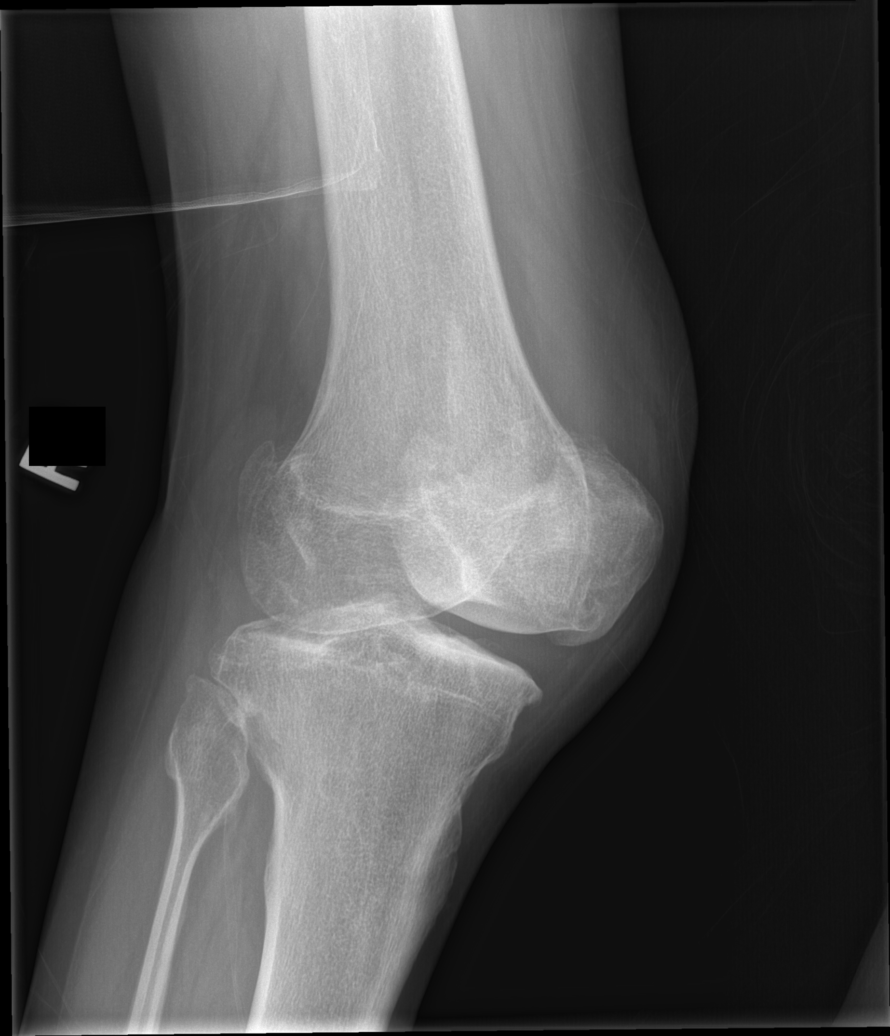

[5 of 5 positions shown; findings below may reference images not displayed]

FINDINGS: Mild tricompartmental osteoarthritic change. No acute fracture or
dislocation.
IMPRESSION: No acute findings.
